# Patient Record
Sex: Female | Born: 1965 | Race: White | Hispanic: No | Marital: Single | Smoking: Never smoker
Health system: Southern US, Community
[De-identification: ages and names within clinical notes are randomized; demographics above are authoritative.]

## PROBLEM LIST (undated history)

## (undated) DIAGNOSIS — G4730 Sleep apnea, unspecified: Secondary | ICD-10-CM

## (undated) DIAGNOSIS — Z9889 Other specified postprocedural states: Secondary | ICD-10-CM

## (undated) DIAGNOSIS — T8859XA Other complications of anesthesia, initial encounter: Secondary | ICD-10-CM

## (undated) DIAGNOSIS — I208 Other forms of angina pectoris: Secondary | ICD-10-CM

## (undated) DIAGNOSIS — E119 Type 2 diabetes mellitus without complications: Secondary | ICD-10-CM

## (undated) DIAGNOSIS — J189 Pneumonia, unspecified organism: Secondary | ICD-10-CM

## (undated) DIAGNOSIS — R112 Nausea with vomiting, unspecified: Secondary | ICD-10-CM

## (undated) DIAGNOSIS — T7840XA Allergy, unspecified, initial encounter: Secondary | ICD-10-CM

## (undated) HISTORY — PX: OOPHORECTOMY: SHX86

## (undated) HISTORY — DX: Type 2 diabetes mellitus without complications: E11.9

## (undated) HISTORY — PX: ABDOMINAL HYSTERECTOMY: SHX81

## (undated) HISTORY — DX: Allergy, unspecified, initial encounter: T78.40XA

## (undated) HISTORY — PX: TUBAL LIGATION: SHX77

## (undated) HISTORY — DX: Other forms of angina pectoris: I20.8

## (undated) HISTORY — PX: TOTAL VAGINAL HYSTERECTOMY: SHX2548

## (undated) HISTORY — PX: CHOLECYSTECTOMY: SHX55

## (undated) HISTORY — PX: OTHER SURGICAL HISTORY: SHX169

## (undated) HISTORY — PX: NASAL SINUS SURGERY: SHX719

---

## 2003-11-14 ENCOUNTER — Ambulatory Visit (HOSPITAL_BASED_OUTPATIENT_CLINIC_OR_DEPARTMENT_OTHER): Admission: RE | Admit: 2003-11-14 | Discharge: 2003-11-14 | Payer: Self-pay | Admitting: Neurology

## 2003-12-26 ENCOUNTER — Ambulatory Visit (HOSPITAL_BASED_OUTPATIENT_CLINIC_OR_DEPARTMENT_OTHER): Admission: RE | Admit: 2003-12-26 | Discharge: 2003-12-26 | Payer: Self-pay | Admitting: Ophthalmology

## 2005-04-23 ENCOUNTER — Other Ambulatory Visit: Payer: Self-pay

## 2005-04-23 ENCOUNTER — Emergency Department: Payer: Self-pay | Admitting: Emergency Medicine

## 2006-05-28 ENCOUNTER — Ambulatory Visit: Payer: Self-pay | Admitting: Gastroenterology

## 2008-07-29 ENCOUNTER — Emergency Department: Payer: Self-pay | Admitting: Emergency Medicine

## 2009-03-04 ENCOUNTER — Ambulatory Visit: Payer: Self-pay | Admitting: Family Medicine

## 2010-04-29 ENCOUNTER — Ambulatory Visit: Payer: Self-pay | Admitting: Family Medicine

## 2011-05-24 ENCOUNTER — Ambulatory Visit: Payer: Self-pay | Admitting: Family Medicine

## 2011-10-24 ENCOUNTER — Ambulatory Visit: Payer: Self-pay | Admitting: Gastroenterology

## 2011-10-30 LAB — PATHOLOGY REPORT

## 2013-03-12 ENCOUNTER — Ambulatory Visit: Payer: Self-pay | Admitting: Family Medicine

## 2013-03-19 ENCOUNTER — Ambulatory Visit: Payer: Self-pay | Admitting: Family Medicine

## 2014-04-17 ENCOUNTER — Ambulatory Visit: Payer: Self-pay | Admitting: Family Medicine

## 2014-04-21 ENCOUNTER — Ambulatory Visit: Payer: Self-pay | Admitting: Family Medicine

## 2014-10-28 ENCOUNTER — Ambulatory Visit: Payer: Self-pay | Admitting: Family Medicine

## 2015-02-11 DIAGNOSIS — I493 Ventricular premature depolarization: Secondary | ICD-10-CM | POA: Insufficient documentation

## 2015-05-28 ENCOUNTER — Other Ambulatory Visit: Payer: Self-pay | Admitting: Internal Medicine

## 2015-05-28 DIAGNOSIS — Z1231 Encounter for screening mammogram for malignant neoplasm of breast: Secondary | ICD-10-CM

## 2015-06-04 ENCOUNTER — Ambulatory Visit: Payer: Self-pay

## 2015-06-11 ENCOUNTER — Other Ambulatory Visit: Payer: Self-pay | Admitting: Internal Medicine

## 2015-06-11 ENCOUNTER — Ambulatory Visit
Admission: RE | Admit: 2015-06-11 | Discharge: 2015-06-11 | Disposition: A | Discharge status: Home / Self Care | Payer: 59 | Source: Ambulatory Visit | Attending: Internal Medicine | Admitting: Internal Medicine

## 2015-06-11 DIAGNOSIS — Z1231 Encounter for screening mammogram for malignant neoplasm of breast: Secondary | ICD-10-CM | POA: Diagnosis present

## 2015-07-08 ENCOUNTER — Encounter: Payer: Self-pay | Admitting: Internal Medicine

## 2015-07-08 ENCOUNTER — Other Ambulatory Visit: Payer: Self-pay | Admitting: Internal Medicine

## 2015-07-08 DIAGNOSIS — E119 Type 2 diabetes mellitus without complications: Secondary | ICD-10-CM | POA: Insufficient documentation

## 2015-07-08 DIAGNOSIS — E1169 Type 2 diabetes mellitus with other specified complication: Secondary | ICD-10-CM | POA: Insufficient documentation

## 2015-07-08 DIAGNOSIS — E559 Vitamin D deficiency, unspecified: Secondary | ICD-10-CM | POA: Insufficient documentation

## 2015-07-08 DIAGNOSIS — J3089 Other allergic rhinitis: Secondary | ICD-10-CM | POA: Insufficient documentation

## 2015-07-08 DIAGNOSIS — E785 Hyperlipidemia, unspecified: Secondary | ICD-10-CM

## 2015-07-09 ENCOUNTER — Ambulatory Visit (INDEPENDENT_AMBULATORY_CARE_PROVIDER_SITE_OTHER): Payer: 59 | Admitting: Internal Medicine

## 2015-07-09 ENCOUNTER — Encounter: Payer: Self-pay | Admitting: Internal Medicine

## 2015-07-09 VITALS — BP 120/80 | HR 64 | Temp 97.8°F | Ht 65.0 in | Wt 301.4 lb

## 2015-07-09 DIAGNOSIS — J0100 Acute maxillary sinusitis, unspecified: Secondary | ICD-10-CM | POA: Diagnosis not present

## 2015-07-09 DIAGNOSIS — E119 Type 2 diabetes mellitus without complications: Secondary | ICD-10-CM

## 2015-07-09 MED ORDER — AMOXICILLIN-POT CLAVULANATE 875-125 MG PO TABS: 1.0000 | ORAL_TABLET | Freq: Two times a day (BID) | ORAL | Status: DC

## 2015-07-09 NOTE — Progress Notes (Signed)
Date:  07/09/2015   Name:  Mary Macias   DOB:  16-Nov-1965   MRN:  [id number]   Chief Complaint: Sinusitis Sinusitis This is a new problem. The current episode started in the past 7 days. The problem has been gradually worsening since onset. The maximum temperature recorded prior to her arrival was 100.4 - 100.9 F. The pain is mild. Associated symptoms include congestion, coughing, a hoarse voice, sinus pressure and a sore throat. Pertinent negatives include no chills, ear pain, headaches or shortness of breath. Past treatments include oral decongestants and spray decongestants. The treatment provided moderate relief.  Diabetes She presents for her follow-up diabetic visit. She has type 2 diabetes mellitus. Her disease course has been stable. Pertinent negatives for hypoglycemia include no headaches. Pertinent negatives for diabetes include no chest pain and no fatigue. Symptoms are stable.   Her last A1c was 6.7. At that time I suggested that she begin metformin but she wanted to work on weight loss. She reports no success with losing any weight. She would consider medication if her A1c were worsening.   Review of Systems:  Review of Systems  Constitutional: Positive for fever. Negative for chills and fatigue.  HENT: Positive for congestion, hoarse voice, sinus pressure and sore throat. Negative for ear pain.   Respiratory: Positive for cough and chest tightness. Negative for shortness of breath and wheezing.   Cardiovascular: Negative for chest pain.  Gastrointestinal: Negative for vomiting and diarrhea.  Neurological: Negative for light-headedness and headaches.    Patient Active Problem List   Diagnosis Date Noted  . Diabetes 07/08/2015  . 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 Allergic rhinitis, seasonal 07/08/2015  . Avitaminosis D 07/08/2015  . Beat, premature ventricular 02/11/2015  . Awareness of heartbeats 02/11/2015    Prior to Admission  medications   Medication Sig Start Date End Date Taking? Authorizing Provider  fluticasone (FLONASE) 50 MCG/ACT nasal spray Place 2 sprays into the nose daily.    Historical Provider, MD    Allergies  Allergen Reactions  . Povidone Iodine Rash    Other Reaction: Other reaction  . Cephalexin Rash  . Peanut-Containing Drug Products Rash    Past Surgical History  Procedure Laterality Date  . Total vaginal hysterectomy    . Cholecystectomy    . Tubal ligation    . Nasal sinus surgery      Social History  Substance Use Topics  . Smoking status: Never Smoker   . Smokeless tobacco: None  . Alcohol Use: No     Medication list has been reviewed and updated.  Physical Examination:  Physical Exam  Constitutional: She is oriented to person, place, and time. She appears well-developed and well-nourished. No distress.  HENT:  Head: Normocephalic and atraumatic.  Right Ear: Tympanic membrane and ear canal normal.  Left Ear: Tympanic membrane and ear canal normal.  Nose: Right sinus exhibits maxillary sinus tenderness and frontal sinus tenderness. Left sinus exhibits maxillary sinus tenderness and frontal sinus tenderness.  Mouth/Throat: Posterior oropharyngeal erythema present.  Eyes: Conjunctivae are normal. Right eye exhibits no discharge. Left eye exhibits no discharge. No scleral icterus.  Cardiovascular: Normal rate, regular rhythm and normal heart sounds.   Pulmonary/Chest: Effort normal and breath sounds normal. No respiratory distress. She has no wheezes. She has no rhonchi.  Musculoskeletal: Normal range of motion.  Neurological: She is alert and oriented to person, place, and time.  Skin: Skin is warm and dry. No rash noted.  Psychiatric: She has a normal mood and affect. Her behavior is normal. Thought content normal.    BP 120/80 mmHg  Pulse 64  Temp(Src) 97.8 F (36.6 C)  Ht 5\' 5"  (1.651 m)  Wt 301 lb 6.4 oz (136.714 kg)  BMI 50.16 kg/m2  SpO2 99%  Assessment  and Plan: 1. Acute maxillary sinusitis, recurrence not specified Augmentin 875 twice a day 10 days continue Flonase and over-the-counter allergy medications   2. Type 2 diabetes mellitus without complication Continue efforts at diet and weight loss Will advise her if medication should be started - Hemoglobin A1c   Halina Maidens, MD [facility]  07/09/2015

## 2015-07-10 LAB — HEMOGLOBIN A1C
Est. average glucose Bld gHb Est-mCnc: 151 mg/dL
HEMOGLOBIN A1C: 6.9 % — AB (ref 4.8–5.6)

## 2015-07-11 ENCOUNTER — Other Ambulatory Visit: Payer: Self-pay | Admitting: Internal Medicine

## 2015-07-11 MED ORDER — METFORMIN HCL 500 MG PO TABS: 500.0000 mg | ORAL_TABLET | Freq: Two times a day (BID) | ORAL | Status: DC

## 2015-08-18 ENCOUNTER — Ambulatory Visit (INDEPENDENT_AMBULATORY_CARE_PROVIDER_SITE_OTHER): Payer: 59 | Admitting: Internal Medicine

## 2015-08-18 ENCOUNTER — Encounter: Payer: Self-pay | Admitting: Internal Medicine

## 2015-08-18 VITALS — BP 120/70 | HR 68 | Temp 97.8°F | Ht 65.0 in | Wt 302.4 lb

## 2015-08-18 DIAGNOSIS — Z79899 Other long term (current) drug therapy: Secondary | ICD-10-CM | POA: Insufficient documentation

## 2015-08-18 DIAGNOSIS — J40 Bronchitis, not specified as acute or chronic: Secondary | ICD-10-CM | POA: Diagnosis not present

## 2015-08-18 DIAGNOSIS — E119 Type 2 diabetes mellitus without complications: Secondary | ICD-10-CM

## 2015-08-18 DIAGNOSIS — E118 Type 2 diabetes mellitus with unspecified complications: Secondary | ICD-10-CM | POA: Insufficient documentation

## 2015-08-18 MED ORDER — SITAGLIPTIN PHOSPHATE 100 MG PO TABS: 100.0000 mg | ORAL_TABLET | Freq: Every day | ORAL | Status: DC

## 2015-08-18 MED ORDER — DOXYCYCLINE HYCLATE 100 MG PO TABS: 100.0000 mg | ORAL_TABLET | Freq: Two times a day (BID) | ORAL | Status: DC

## 2015-08-18 NOTE — Progress Notes (Signed)
Date:  08/18/2015   Name:  Mary Macias   DOB:  1965-12-02   MRN:  [id number]   Chief Complaint: Cough Cough This is a new problem. The current episode started in the past 7 days. The problem has been gradually worsening. The problem occurs hourly. The cough is non-productive. Associated symptoms include chest pain (burning chest discomfort with cough), a fever, nasal congestion, postnasal drip and shortness of breath. Pertinent negatives include no chills, ear pain, headaches, sore throat, sweats or wheezing. The symptoms are aggravated by exercise. She has tried OTC cough suppressant for the symptoms. The treatment provided no relief.  Diabetes She presents for her follow-up diabetic visit. She has type 2 diabetes mellitus. Pertinent negatives for hypoglycemia include no headaches or sweats. Associated symptoms include chest pain (burning chest discomfort with cough) and fatigue. Pertinent negatives for diabetes include no polydipsia and no polyuria. Current diabetic treatment includes oral agent (monotherapy). She is compliant with treatment some of the time (could not tolerate metformin due to diarrhea). Her weight is stable. She is following a generally healthy diet. Her breakfast blood glucose is taken between 7-8 am. Her breakfast blood glucose range is generally 110-130 mg/dl. An ACE inhibitor/angiotensin II receptor blocker is not being taken.     Review of Systems  Constitutional: Positive for fever and fatigue. Negative for chills.  HENT: Positive for postnasal drip. Negative for ear discharge, ear pain, sinus pressure and sore throat.   Respiratory: Positive for cough and shortness of breath. Negative for choking and wheezing.   Cardiovascular: Positive for chest pain (burning chest discomfort with cough).  Gastrointestinal: Negative for vomiting and diarrhea.  Endocrine: Negative for polydipsia and polyuria.  Neurological: Negative for headaches.    Patient Active Problem List    Diagnosis Date Noted  . Type 2 diabetes mellitus without complication, without long-term current use of insulin ([place]) 08/18/2015  . 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 Allergic rhinitis, seasonal 07/08/2015  . Avitaminosis D 07/08/2015  . Beat, premature ventricular 02/11/2015  . Awareness of heartbeats 02/11/2015    Prior to Admission medications   Medication Sig Start Date End Date Taking? Authorizing Provider  fluticasone (FLONASE) 50 MCG/ACT nasal spray Place 2 sprays into the nose daily.   Yes Historical Provider, MD  metFORMIN (GLUCOPHAGE) 500 MG tablet Take 1 tablet (500 mg total) by mouth 2 (two) times daily with a meal. Patient not taking: Reported on 08/18/2015 07/11/15   Glean Hess, MD    Allergies  Allergen Reactions  . Povidone Iodine Rash    Other Reaction: Other reaction  . Cephalexin Rash  . Peanut-Containing Drug Products Rash    Past Surgical History  Procedure Laterality Date  . Total vaginal hysterectomy    . Cholecystectomy    . Tubal ligation    . Nasal sinus surgery      Social History  Substance Use Topics  . Smoking status: Never Smoker   . Smokeless tobacco: None  . Alcohol Use: No    Medication list has been reviewed and updated.   Physical Exam  Constitutional: She is oriented to person, place, and time. She appears well-developed and well-nourished. No distress.  HENT:  Head: Normocephalic and atraumatic.  Eyes: Conjunctivae are normal. Right eye exhibits no discharge. Left eye exhibits no discharge. No scleral icterus.  Cardiovascular: Normal rate, regular rhythm and normal heart sounds.   No murmur heard. Pulmonary/Chest: Effort normal. No accessory muscle usage. No respiratory distress. She  has decreased breath sounds (bronchial breath sounds both upper lobes). She has no wheezes.  Musculoskeletal: Normal range of motion.  Neurological: She is alert and oriented to person, place, and time.  Skin:  Skin is warm and dry. No rash noted.  Psychiatric: She has a normal mood and affect. Her behavior is normal. Thought content normal.  Nursing note and vitals reviewed.   BP 120/70 mmHg  Pulse 68  Temp(Src) 97.8 F (36.6 C)  Ht 5\' 5"  (1.651 m)  Wt 302 lb 6.4 oz (137.168 kg)  BMI 50.32 kg/m2  SpO2 99%  Assessment and Plan: 1. Bronchitis Suspect atypical organism Over-the-counter cough syrup and Allegra - doxycycline (VIBRA-TABS) 100 MG tablet; Take 1 tablet (100 mg total) by mouth 2 (two) times daily.  Dispense: 20 tablet; Refill: 0  2. Type 2 diabetes mellitus without complication, without long-term current use of insulin (HCC) Intolerant of metformin so will prescribe Januvia - sitaGLIPtin (JANUVIA) 100 MG tablet; Take 1 tablet (100 mg total) by mouth daily.  Dispense: 30 tablet; Refill: 0   Halina Maidens, MD [facility]  08/18/2015

## 2015-09-01 ENCOUNTER — Encounter: Payer: Self-pay | Admitting: Internal Medicine

## 2015-09-01 ENCOUNTER — Ambulatory Visit (INDEPENDENT_AMBULATORY_CARE_PROVIDER_SITE_OTHER): Payer: 59 | Admitting: Internal Medicine

## 2015-09-01 VITALS — BP 130/72 | HR 88 | Temp 97.8°F | Ht 65.0 in | Wt 304.2 lb

## 2015-09-01 DIAGNOSIS — J40 Bronchitis, not specified as acute or chronic: Secondary | ICD-10-CM | POA: Diagnosis not present

## 2015-09-01 MED ORDER — LEVOFLOXACIN 500 MG PO TABS
500.0000 mg | ORAL_TABLET | Freq: Every day | ORAL | Status: DC
Start: 2015-09-01 — End: 2015-12-03

## 2015-09-01 NOTE — Progress Notes (Signed)
Date:  09/01/2015   Name:  Mary Macias   DOB:  12/20/1965   MRN:  [id number]   Chief Complaint: Cough  Patient was seen 2 weeks ago for tracheobronchitis treated with doxycycline. She finished 10 days of treatment 4 days ago. Since then she's had progressive symptoms again with burning in her chest dry cough and fatigue. She continues to use Flonase and Allegra-D for her sinus symptoms. She denies any wheezing or discolored nasal discharge or sputum.  Review of Systems  Constitutional: Positive for fatigue. Negative for fever, chills and unexpected weight change.  HENT: Positive for postnasal drip. Negative for sinus pressure and sore throat.   Respiratory: Positive for cough and chest tightness. Negative for shortness of breath and wheezing.   Cardiovascular: Negative for chest pain and palpitations.  Gastrointestinal: Negative for nausea, abdominal pain and diarrhea.  Neurological: Negative for dizziness and headaches.    Patient Active Problem List   Diagnosis Date Noted  . Type 2 diabetes mellitus without complication, without long-term current use of insulin ([place]) 08/18/2015  . 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 Allergic rhinitis, seasonal 07/08/2015  . Avitaminosis D 07/08/2015  . Beat, premature ventricular 02/11/2015  . Awareness of heartbeats 02/11/2015    Prior to Admission medications   Medication Sig Start Date End Date Taking? Authorizing Provider  fluticasone (FLONASE) 50 MCG/ACT nasal spray Place 2 sprays into the nose daily.   Yes Historical Provider, MD  sitaGLIPtin (JANUVIA) 100 MG tablet Take 1 tablet (100 mg total) by mouth daily. 08/18/15  Yes Glean Hess, MD    Allergies  Allergen Reactions  . Povidone Iodine Rash    Other Reaction: Other reaction  . Cephalexin Rash  . Peanut-Containing Drug Products Rash    Past Surgical History  Procedure Laterality Date  . Total vaginal hysterectomy    . Cholecystectomy    .  Tubal ligation    . Nasal sinus surgery      Social History  Substance Use Topics  . Smoking status: Never Smoker   . Smokeless tobacco: None  . Alcohol Use: No    Medication list has been reviewed and updated.   Physical Exam  Constitutional: She is oriented to person, place, and time. She appears well-developed and well-nourished. No distress.  HENT:  Head: Normocephalic and atraumatic.  Neck: Normal range of motion. Neck supple.  Cardiovascular: Normal rate, regular rhythm and normal heart sounds.   Pulmonary/Chest: Effort normal and breath sounds normal. No respiratory distress. She has no wheezes.  Lymphadenopathy:    She has no cervical adenopathy.  Neurological: She is alert and oriented to person, place, and time.  Skin: No rash noted.  Psychiatric: She has a normal mood and affect.    BP 130/72 mmHg  Pulse 88  Temp(Src) 97.8 F (36.6 C)  Ht 5\' 5"  (1.651 m)  Wt 304 lb 3.2 oz (137.984 kg)  BMI 50.62 kg/m2  SpO2 98%  Assessment and Plan: 1. Bronchitis Continue Flonase and Allegra-D Rest and adequate fluids - levofloxacin (LEVAQUIN) 500 MG tablet; Take 1 tablet (500 mg total) by mouth daily.  Dispense: 10 tablet; Refill: 0   Halina Maidens, MD [facility]  09/01/2015

## 2015-09-27 ENCOUNTER — Telehealth: Payer: Self-pay

## 2015-09-27 ENCOUNTER — Other Ambulatory Visit: Payer: Self-pay | Admitting: Internal Medicine

## 2015-09-27 MED ORDER — NYSTATIN 100000 UNIT/GM EX CREA: 1.0000 "application " | TOPICAL_CREAM | Freq: Two times a day (BID) | CUTANEOUS | Status: DC

## 2015-09-27 NOTE — Telephone Encounter (Signed)
Patient needs Nystatin Cream called in to [facility] in [place] for skin infection.dr

## 2015-10-12 ENCOUNTER — Ambulatory Visit (INDEPENDENT_AMBULATORY_CARE_PROVIDER_SITE_OTHER): Payer: 59

## 2015-10-12 DIAGNOSIS — Z23 Encounter for immunization: Secondary | ICD-10-CM | POA: Diagnosis not present

## 2015-10-13 ENCOUNTER — Other Ambulatory Visit: Payer: Self-pay | Admitting: Internal Medicine

## 2015-11-16 ENCOUNTER — Ambulatory Visit: Payer: 59 | Admitting: Internal Medicine

## 2015-12-03 ENCOUNTER — Other Ambulatory Visit: Payer: Self-pay | Admitting: Internal Medicine

## 2015-12-03 ENCOUNTER — Ambulatory Visit (INDEPENDENT_AMBULATORY_CARE_PROVIDER_SITE_OTHER): Payer: 59 | Admitting: Internal Medicine

## 2015-12-03 ENCOUNTER — Encounter: Payer: Self-pay | Admitting: Internal Medicine

## 2015-12-03 VITALS — BP 122/78 | HR 68 | Resp 16 | Ht 65.0 in | Wt 307.0 lb

## 2015-12-03 DIAGNOSIS — E119 Type 2 diabetes mellitus without complications: Secondary | ICD-10-CM | POA: Diagnosis not present

## 2015-12-03 MED ORDER — METFORMIN HCL ER (OSM) 500 MG PO TB24: 500.0000 mg | ORAL_TABLET | Freq: Every day | ORAL | Status: DC

## 2015-12-03 MED ORDER — METFORMIN HCL ER (MOD) 500 MG PO TB24: 500.0000 mg | ORAL_TABLET | Freq: Every day | ORAL | Status: DC

## 2015-12-03 NOTE — Progress Notes (Signed)
Date:  12/03/2015   Name:  Mary Macias   DOB:  07/12/1966   MRN:  [id number]   Chief Complaint: Diabetes Diabetes She presents for her follow-up diabetic visit. She has type 2 diabetes mellitus. Her disease course has been worsening. Associated symptoms include foot paresthesias. Pertinent negatives for diabetes include no chest pain, no fatigue, no polyphagia and no polyuria. Symptoms are stable (unable to afford januvia; metformin caused diarrhea). Her breakfast blood glucose is taken between 6-7 am. Her breakfast blood glucose range is generally 110-130 mg/dl.  Could not afford januvia - $250 per month.  She would like to try metformin again - had taken it in the past.  The recent Rx was not for sustained release.  Lab Results  Component Value Date   HGBA1C 6.9* 07/09/2015     Review of Systems  Constitutional: Negative for chills, fatigue and unexpected weight change.  Eyes: Negative for visual disturbance.  Respiratory: Negative for cough, chest tightness and wheezing.   Cardiovascular: Negative for chest pain, palpitations and leg swelling.  Gastrointestinal: Negative for abdominal pain, diarrhea and constipation.  Endocrine: Negative for polyphagia and polyuria.  Genitourinary: Negative for dysuria.  Musculoskeletal: Negative for arthralgias.  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  Psychiatric/Behavioral: Negative for dysphoric mood.    Patient Active Problem List   Diagnosis Date Noted  . Diabetes mellitus ([place]) 12/03/2015  . Type 2 diabetes mellitus without complication, without long-term current use of insulin ([place]) 08/18/2015  . 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 Allergic rhinitis, seasonal 07/08/2015  . Avitaminosis D 07/08/2015  . Beat, premature ventricular 02/11/2015  . Awareness of heartbeats 02/11/2015    Prior to Admission medications   Medication Sig Start Date End Date Taking? Authorizing  Provider  fluticasone (FLONASE) 50 MCG/ACT nasal spray Place 2 sprays into the nose daily.   Yes Historical Provider, MD  JANUVIA 100 MG tablet TAKE 1 TABLET (100 MG TOTAL) BY MOUTH DAILY. 10/14/15  Yes Glean Hess, MD  nystatin cream (MYCOSTATIN) Apply 1 application topically 2 (two) times daily. 09/27/15  Yes Glean Hess, MD    Allergies  Allergen Reactions  . Povidone Iodine Rash    Other Reaction: Other reaction  . Cephalexin Rash  . Peanut-Containing Drug Products Rash    Past Surgical History  Procedure Laterality Date  . Total vaginal hysterectomy    . Cholecystectomy    . Tubal ligation    . Nasal sinus surgery      Social History  Substance Use Topics  . Smoking status: Never Smoker   . Smokeless tobacco: None  . Alcohol Use: No     Medication list has been reviewed and updated.   Physical Exam  Constitutional: She is oriented to person, place, and time. She appears well-developed. No distress.  HENT:  Head: Normocephalic and atraumatic.  Cardiovascular: Normal rate, regular rhythm and normal heart sounds.   Pulmonary/Chest: Effort normal and breath sounds normal. No respiratory distress.  Musculoskeletal: Normal range of motion. She exhibits no edema.  Lymphadenopathy:    She has no cervical adenopathy.  Neurological: She is alert and oriented to person, place, and time.  Skin: Skin is warm and dry. No rash noted.  Psychiatric: She has a normal mood and affect. Her behavior is normal. Thought content normal.  Nursing note and vitals reviewed.   BP 122/78 mmHg  Pulse 68  Resp 16  Ht 5\' 5"  (1.651  m)  Wt 307 lb (139.254 kg)  BMI 51.09 kg/m2  SpO2 97%  Assessment and Plan: 1. Type 2 diabetes mellitus without complication, without long-term current use of insulin ([place]) Stop januvia due to cost Try ER formulation metformin Follow up in 3 months - metformin (FORTAMET) 500 MG (OSM) 24 hr tablet; Take 1 tablet (500 mg total) by mouth daily with  breakfast.  Dispense: 30 tablet; Refill: [place], MD [facility]  12/03/2015

## 2016-03-03 ENCOUNTER — Ambulatory Visit: Payer: 59 | Admitting: Internal Medicine

## 2016-03-08 ENCOUNTER — Ambulatory Visit: Payer: 59 | Admitting: Internal Medicine

## 2016-03-10 ENCOUNTER — Encounter: Payer: Self-pay | Admitting: Internal Medicine

## 2016-03-10 ENCOUNTER — Ambulatory Visit (INDEPENDENT_AMBULATORY_CARE_PROVIDER_SITE_OTHER): Payer: 59 | Admitting: Internal Medicine

## 2016-03-10 VITALS — BP 120/80 | HR 84 | Temp 98.6°F | Resp 16 | Ht 65.0 in | Wt 306.0 lb

## 2016-03-10 DIAGNOSIS — J302 Other seasonal allergic rhinitis: Secondary | ICD-10-CM | POA: Diagnosis not present

## 2016-03-10 DIAGNOSIS — E119 Type 2 diabetes mellitus without complications: Secondary | ICD-10-CM

## 2016-03-10 MED ORDER — FEXOFENADINE-PSEUDOEPHED ER 180-240 MG PO TB24: 1.0000 | ORAL_TABLET | Freq: Every day | ORAL | Status: DC

## 2016-03-10 NOTE — Progress Notes (Signed)
Date:  03/10/2016   Name:  Mary Macias   DOB:  1966-05-27   MRN:  [id number]   Chief Complaint: Diabetes and Mouth Lesions Diabetes She presents for her follow-up diabetic visit. She has type 2 diabetes mellitus. Pertinent negatives for hypoglycemia include no dizziness or headaches. Pertinent negatives for diabetes include no fatigue. Symptoms are stable. Current diabetic treatment includes oral agent (monotherapy). She is compliant with treatment all of the time. There is no change in her home blood glucose trend. An ACE inhibitor/angiotensin II receptor blocker is not being taken.  Mouth Lesions  Associated symptoms include mouth sores and rhinorrhea. Pertinent negatives include no constipation, no ear pain and no headaches.  She has noticed her tongue turned dark after using hydrogen peroxide.  She denies pain or altered taste.  She still has tonsil stones that she has been managing.  Allergic rhinitis - taking allegra-D with good control of symtpoms.  Tonsil stones seen to be less frequent.  Wt Readings from Last 3 Encounters:  03/10/16 306 lb (138.801 kg)  12/03/15 307 lb (139.254 kg)  09/01/15 304 lb 3.2 oz (137.984 kg)     Review of Systems  Constitutional: Negative for chills, fatigue and unexpected weight change.  HENT: Positive for mouth sores, postnasal drip and rhinorrhea. Negative for ear pain, tinnitus and trouble swallowing.   Eyes: Negative for visual disturbance.  Respiratory: Negative for chest tightness and shortness of breath.   Cardiovascular: Negative for palpitations and leg swelling.  Gastrointestinal: Negative for constipation.  Neurological: Negative for dizziness and headaches.  Psychiatric/Behavioral: Negative for dysphoric mood.    Patient Active Problem List   Diagnosis Date Noted  . Type 2 diabetes mellitus without complication, without long-term current use of insulin ([place]) 08/18/2015  . Combined fat and carbohydrate induced hyperlipemia  07/08/2015  . Allergic rhinitis, seasonal 07/08/2015  . Avitaminosis D 07/08/2015  . Beat, premature ventricular 02/11/2015  . Awareness of heartbeats 02/11/2015    Prior to Admission medications   Medication Sig Start Date End Date Taking? Authorizing Provider  fexofenadine-pseudoephedrine (ALLEGRA-D 24) 180-240 MG 24 hr tablet Take 1 tablet by mouth daily.   Yes Historical Provider, MD  fluticasone (FLONASE) 50 MCG/ACT nasal spray Place 2 sprays into the nose daily.   Yes Historical Provider, MD  metFORMIN (GLUCOPHAGE-XR) 500 MG 24 hr tablet TAKE 1 TABLET (500 MG TOTAL) BY MOUTH DAILY WITH BREAKFAST. 02/16/16  Yes Historical Provider, MD  nystatin cream (MYCOSTATIN) Apply 1 application topically 2 (two) times daily. 09/27/15  Yes Glean Hess, MD    Allergies  Allergen Reactions  . Povidone Iodine Rash    Other Reaction: Other reaction  . Cephalexin Rash  . Peanut-Containing Drug Products Rash    Past Surgical History  Procedure Laterality Date  . Total vaginal hysterectomy    . Cholecystectomy    . Tubal ligation    . Nasal sinus surgery      Social History  Substance Use Topics  . Smoking status: Never Smoker   . Smokeless tobacco: None  . Alcohol Use: No     Medication list has been reviewed and updated.   Physical Exam  Constitutional: She is oriented to person, place, and time. She appears well-developed. No distress.  HENT:  Head: Normocephalic and atraumatic.  Mouth/Throat: Uvula is midline and oropharynx is clear and moist.  Tongue with brown-green discoloration - no plaque or tenderness Buccal mucosa normal Tonsils normal - no stones seen  Neck: Normal  range of motion. Neck supple. Carotid bruit is not present.  Cardiovascular: Normal rate, regular rhythm and normal heart sounds.   Pulmonary/Chest: Effort normal and breath sounds normal. No respiratory distress. She has no wheezes.  Musculoskeletal: She exhibits no edema or tenderness.    Neurological: She is alert and oriented to person, place, and time.  Skin: Skin is warm and dry. No rash noted.  Psychiatric: She has a normal mood and affect. Her speech is normal and behavior is normal. Thought content normal.  Nursing note and vitals reviewed.   BP 120/80 mmHg  Pulse 84  Temp(Src) 98.6 F (37 C) (Oral)  Resp 16  Ht 5\' 5"  (1.651 m)  Wt 306 lb (138.801 kg)  BMI 50.92 kg/m2  SpO2 100%  Assessment and Plan: 1. Type 2 diabetes mellitus without complication, without long-term current use of insulin (HCC) Continue metformin; check labs - Hemoglobin A1c - Comprehensive metabolic panel  2. Allergic rhinitis, seasonal - fexofenadine-pseudoephedrine (ALLEGRA-D 24) 180-240 MG 24 hr tablet; Take 1 tablet by mouth daily.  Dispense: 30 tablet; Refill: 5  3. Discolored tongue  Continue saline gargles  Halina Maidens, MD [facility]  03/10/2016

## 2016-03-11 LAB — COMPREHENSIVE METABOLIC PANEL
A/G RATIO: 1.5 (ref 1.2–2.2)
ALBUMIN: 4.3 g/dL (ref 3.5–5.5)
ALT: 31 IU/L (ref 0–32)
AST: 24 IU/L (ref 0–40)
Alkaline Phosphatase: 68 IU/L (ref 39–117)
BUN / CREAT RATIO: 15 (ref 9–23)
BUN: 15 mg/dL (ref 6–24)
Bilirubin Total: 0.3 mg/dL (ref 0.0–1.2)
CALCIUM: 9.4 mg/dL (ref 8.7–10.2)
CO2: 22 mmol/L (ref 18–29)
Chloride: 98 mmol/L (ref 96–106)
Creatinine, Ser: 1.01 mg/dL — ABNORMAL HIGH (ref 0.57–1.00)
GFR calc Af Amer: 74 mL/min/{1.73_m2} (ref >59)
GFR, EST NON AFRICAN AMERICAN: 65 mL/min/{1.73_m2} (ref >59)
GLOBULIN, TOTAL: 2.8 g/dL (ref 1.5–4.5)
Glucose: 134 mg/dL — ABNORMAL HIGH (ref 65–99)
POTASSIUM: 4.2 mmol/L (ref 3.5–5.2)
SODIUM: 138 mmol/L (ref 134–144)
Total Protein: 7.1 g/dL (ref 6.0–8.5)

## 2016-03-11 LAB — HEMOGLOBIN A1C
ESTIMATED AVERAGE GLUCOSE: 151 mg/dL
HEMOGLOBIN A1C: 6.9 % — AB (ref 4.8–5.6)

## 2016-03-23 ENCOUNTER — Other Ambulatory Visit: Payer: Self-pay | Admitting: Internal Medicine

## 2016-03-23 MED ORDER — METFORMIN HCL ER 500 MG PO TB24: 500.0000 mg | ORAL_TABLET | Freq: Every day | ORAL | Status: DC

## 2016-04-24 ENCOUNTER — Encounter: Payer: Self-pay | Admitting: Internal Medicine

## 2016-04-24 ENCOUNTER — Ambulatory Visit (INDEPENDENT_AMBULATORY_CARE_PROVIDER_SITE_OTHER): Payer: 59 | Admitting: Internal Medicine

## 2016-04-24 VITALS — BP 122/80 | HR 74 | Temp 98.1°F | Resp 16 | Ht 65.0 in | Wt 305.0 lb

## 2016-04-24 DIAGNOSIS — J0100 Acute maxillary sinusitis, unspecified: Secondary | ICD-10-CM | POA: Diagnosis not present

## 2016-04-24 DIAGNOSIS — B009 Herpesviral infection, unspecified: Secondary | ICD-10-CM

## 2016-04-24 MED ORDER — AMOXICILLIN-POT CLAVULANATE 875-125 MG PO TABS: 1.0000 | ORAL_TABLET | Freq: Two times a day (BID) | ORAL | Status: DC

## 2016-04-24 NOTE — Progress Notes (Signed)
Date:  04/24/2016   Name:  Mary Macias   DOB:  10-01-1966   MRN:  [id number]   Chief Complaint: Sinus Problem Sinus Problem This is a new problem. The current episode started in the past 7 days. The problem has been waxing and waning since onset. There has been no fever. Associated symptoms include congestion, ear pain, neck pain, sinus pressure, a sore throat and swollen glands. Pertinent negatives include no shortness of breath. Past treatments include oral decongestants and spray decongestants. The treatment provided mild relief.    Review of Systems  HENT: Positive for congestion, ear pain, sinus pressure and sore throat.   Respiratory: Negative for chest tightness and shortness of breath.   Cardiovascular: Negative for chest pain.  Musculoskeletal: Positive for neck pain.  Skin: Positive for rash.  Hematological: Positive for adenopathy.    Patient Active Problem List   Diagnosis Date Noted  . Type 2 diabetes mellitus without complication, without long-term current use of insulin ([place]) 08/18/2015  . 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 Allergic rhinitis, seasonal 07/08/2015  . Avitaminosis D 07/08/2015  . Beat, premature ventricular 02/11/2015  . Awareness of heartbeats 02/11/2015    Prior to Admission medications   Medication Sig Start Date End Date Taking? Authorizing Provider  fexofenadine-pseudoephedrine (ALLEGRA-D 24) 180-240 MG 24 hr tablet Take 1 tablet by mouth daily. 03/10/16  Yes Glean Hess, MD  fluticasone (FLONASE) 50 MCG/ACT nasal spray Place 2 sprays into the nose daily.   Yes Historical Provider, MD  metFORMIN (GLUCOPHAGE-XR) 500 MG 24 hr tablet Take 1 tablet (500 mg total) by mouth daily with breakfast. 03/23/16  Yes Glean Hess, MD  nystatin cream (MYCOSTATIN) Apply 1 application topically 2 (two) times daily. 09/27/15  Yes Glean Hess, MD    Allergies  Allergen Reactions  . Povidone Iodine Rash    Other  Reaction: Other reaction  . Cephalexin Rash  . Peanut-Containing Drug Products Rash    Past Surgical History  Procedure Laterality Date  . Total vaginal hysterectomy    . Cholecystectomy    . Tubal ligation    . Nasal sinus surgery      Social History  Substance Use Topics  . Smoking status: Never Smoker   . Smokeless tobacco: None  . Alcohol Use: No     Medication list has been reviewed and updated.   Physical Exam  Constitutional: She is oriented to person, place, and time. She appears well-developed and well-nourished.  HENT:  Right Ear: External ear and ear canal normal. Tympanic membrane is not erythematous and not retracted.  Left Ear: External ear and ear canal normal. Tympanic membrane is not erythematous and not retracted.  Nose: Right sinus exhibits maxillary sinus tenderness and frontal sinus tenderness. Left sinus exhibits maxillary sinus tenderness and frontal sinus tenderness.  Mouth/Throat: Uvula is midline and mucous membranes are normal. No oral lesions. Posterior oropharyngeal erythema present. No oropharyngeal exudate.  Neck: Normal range of motion.  Cardiovascular: Normal rate, regular rhythm and normal heart sounds.   Pulmonary/Chest: Breath sounds normal. She has no wheezes. She has no rales.  Lymphadenopathy:    She has cervical adenopathy.  Neurological: She is alert and oriented to person, place, and time.  Skin: Rash (healing herpes lesions on left upper lip) noted.    BP 122/80 mmHg  Pulse 74  Temp(Src) 98.1 F (36.7 C)  Resp 16  Ht 5\' 5"  (1.651 m)  Wt 305 lb (  138.347 kg)  BMI 50.75 kg/m2  SpO2 100%  Assessment and Plan: 1. Acute maxillary sinusitis, recurrence not specified Continue allegra-D and flonase - amoxicillin-clavulanate (AUGMENTIN) 875-125 MG tablet; Take 1 tablet by mouth 2 (two) times daily.  Dispense: 20 tablet; Refill: 0  2. Herpes simplex Continue otc topicals as needed   Halina Maidens, MD [facility]  04/24/2016

## 2016-06-15 ENCOUNTER — Other Ambulatory Visit: Payer: Self-pay | Admitting: Internal Medicine

## 2016-07-14 ENCOUNTER — Other Ambulatory Visit: Payer: Self-pay | Admitting: Internal Medicine

## 2016-07-14 ENCOUNTER — Ambulatory Visit (INDEPENDENT_AMBULATORY_CARE_PROVIDER_SITE_OTHER): Payer: 59 | Admitting: Internal Medicine

## 2016-07-14 ENCOUNTER — Encounter: Payer: Self-pay | Admitting: Internal Medicine

## 2016-07-14 VITALS — BP 124/86 | HR 70 | Ht 65.0 in | Wt 303.0 lb

## 2016-07-14 DIAGNOSIS — E2839 Other primary ovarian failure: Secondary | ICD-10-CM

## 2016-07-14 DIAGNOSIS — Z1231 Encounter for screening mammogram for malignant neoplasm of breast: Secondary | ICD-10-CM

## 2016-07-14 DIAGNOSIS — J3089 Other allergic rhinitis: Secondary | ICD-10-CM

## 2016-07-14 DIAGNOSIS — Z23 Encounter for immunization: Secondary | ICD-10-CM

## 2016-07-14 DIAGNOSIS — E119 Type 2 diabetes mellitus without complications: Secondary | ICD-10-CM

## 2016-07-14 MED ORDER — EPINEPHRINE 0.3 MG/0.3ML IJ SOAJ: 0.3000 mg | Freq: Once | INTRAMUSCULAR | 2 refills | Status: AC

## 2016-07-14 MED ORDER — METFORMIN HCL ER 500 MG PO TB24: 500.0000 mg | ORAL_TABLET | Freq: Every day | ORAL | 1 refills | Status: DC

## 2016-07-14 NOTE — Progress Notes (Signed)
Date:  07/14/2016   Name:  Mary Macias   DOB:  1966-04-05   MRN:  [id number]   Chief Complaint: Follow-up (A1C) Diabetes  She presents for her follow-up diabetic visit. She has type 2 diabetes mellitus. Her disease course has been stable. Pertinent negatives for hypoglycemia include no dizziness or headaches. Pertinent negatives for diabetes include no chest pain, no fatigue and no polyuria. Symptoms are stable. She is compliant with treatment most of the time. There is no change in her home blood glucose trend. Her breakfast blood glucose is taken between 7-8 am. Her breakfast blood glucose range is generally 130-140 mg/dl.   Peanut allergy - needs renewal on EpiPen.  Also has chronic seasonal allergies for which she takes Allegra-D daily.  No recent sinus or ear infections.  HM - due for mammogram and thinks it is time for another DEXA.  Last D[facility] done about 2015 at [facility] but can't find a report.  Lab Results  Component Value Date   HGBA1C 6.9 (H) 03/10/2016      Review of Systems  Constitutional: Negative for chills, fatigue, fever and unexpected weight change.  Eyes: Negative for visual disturbance.  Respiratory: Negative for cough, chest tightness and shortness of breath.   Cardiovascular: Negative for chest pain and palpitations.  Gastrointestinal: Negative for abdominal pain and diarrhea.  Endocrine: Negative for polyuria.  Genitourinary: Negative for dysuria.  Skin: Negative for color change and rash.  Neurological: Negative for dizziness and headaches.  Hematological: Negative for adenopathy.  Psychiatric/Behavioral: Negative for sleep disturbance.    Patient Active Problem List   Diagnosis Date Noted  . Type 2 diabetes mellitus without complication, without long-term current use of insulin ([place]) 08/18/2015  . 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 Allergic rhinitis, seasonal 07/08/2015  . Avitaminosis D 07/08/2015  . Beat,  premature ventricular 02/11/2015  . Awareness of heartbeats 02/11/2015    Prior to Admission medications   Medication Sig Start Date End Date Taking? Authorizing Provider  fexofenadine-pseudoephedrine (ALLEGRA-D 24) 180-240 MG 24 hr tablet Take 1 tablet by mouth daily. 03/10/16  Yes Glean Hess, MD  fluticasone (FLONASE) 50 MCG/ACT nasal spray Place 2 sprays into the nose daily.   Yes Historical Provider, MD  metFORMIN (GLUCOPHAGE-XR) 500 MG 24 hr tablet Take 1 tablet (500 mg total) by mouth daily with breakfast. 03/23/16  Yes Glean Hess, MD  nystatin cream (MYCOSTATIN) APPLY 1 APPLICATION TOPICALLY 2 (TWO) TIMES DAILY. 06/15/16  Yes Glean Hess, MD    Allergies  Allergen Reactions  . Povidone Iodine Rash    Other Reaction: Other reaction  . Cephalexin Rash  . Peanut-Containing Drug Products Rash    Past Surgical History:  Procedure Laterality Date  . CHOLECYSTECTOMY    . NASAL SINUS SURGERY    . TOTAL VAGINAL HYSTERECTOMY    . TUBAL LIGATION      Social History  Substance Use Topics  . Smoking status: Never Smoker  . Smokeless tobacco: Never Used  . Alcohol use No     Medication list has been reviewed and updated.   Physical Exam  Constitutional: She is oriented to person, place, and time. She appears well-developed. No distress.  HENT:  Head: Normocephalic and atraumatic.  Neck: Carotid bruit is not present.  Cardiovascular: Normal rate, regular rhythm and normal heart sounds.   Pulmonary/Chest: Effort normal and breath sounds normal. No respiratory distress. She has no wheezes.  Musculoskeletal: She exhibits no edema  or tenderness.  Neurological: She is alert and oriented to person, place, and time.  Foot exam - normal skin, nails, pulses and sensation bilaterally   Skin: Skin is warm and dry. No rash noted.  Psychiatric: She has a normal mood and affect. Her speech is normal and behavior is normal. Thought content normal.  Nursing note and vitals  reviewed.   BP 124/86   Pulse 70   Ht 5\' 5"  (1.651 m)   Wt (!) 303 lb (137.4 kg)   BMI 50.42 kg/m   Assessment and Plan: 1. Type 2 diabetes mellitus without complication, without long-term current use of insulin ([facility]) Continue current therapy Resume regular exercise - metFORMIN (GLUCOPHAGE-XR) 500 MG 24 hr tablet; Take 1 tablet (500 mg total) by mouth daily with breakfast.  Dispense: 90 tablet; Refill: 1 - Microalbumin / creatinine urine ratio - Hemoglobin A1c - Lipid panel - TSH  2. Environmental and seasonal allergies - EPINEPHrine 0.3 mg/0.3 mL IJ SOAJ injection; Inject 0.3 mLs (0.3 mg total) into the muscle once. (Patient not taking: Reported on 07/14/2016)  Dispense: 1 Device; Refill: 2  3. Need for influenza vaccination - Flu Vaccine QUAD 36+ mos IM  4. Encounter for screening mammogram for breast cancer - MM DIGITAL SCREENING BILATERAL; Future  5. Ovarian failure - DG Bone Density; Future   Halina Maidens, MD [facility]  07/14/2016

## 2016-07-14 NOTE — Patient Instructions (Signed)
Breast Self-Awareness Practicing breast self-awareness may pick up problems early, prevent significant medical complications, and possibly save your life. By practicing breast self-awareness, you can become familiar with how your breasts look and feel and if your breasts are changing. This allows you to notice changes early. It can also offer you some reassurance that your breast health is good. One way to learn what is normal for your breasts and whether your breasts are changing is to do a breast self-exam. If you find a lump or something that was not present in the past, it is best to contact your caregiver right away. Other findings that should be evaluated by your caregiver include nipple discharge, especially if it is bloody; skin changes or reddening; areas where the skin seems to be pulled in (retracted); or new lumps and bumps. Breast pain is seldom associated with cancer (malignancy), but should also be evaluated by a caregiver. HOW TO PERFORM A BREAST SELF-EXAM The best time to examine your breasts is 5-7 days after your menstrual period is over. During menstruation, the breasts are lumpier, and it may be more difficult to pick up changes. If you do not menstruate, have reached menopause, or had your uterus removed (hysterectomy), you should examine your breasts at regular intervals, such as monthly. If you are breastfeeding, examine your breasts after a feeding or after using a breast pump. Breast implants do not decrease the risk for lumps or tumors, so continue to perform breast self-exams as recommended. Talk to your caregiver about how to determine the difference between the implant and breast tissue. Also, talk about the amount of pressure you should use during the exam. Over time, you will become more familiar with the variations of your breasts and more comfortable with the exam. A breast self-exam requires you to remove all your clothes above the waist. 1. Look at your breasts and nipples.  Stand in front of a mirror in a room with good lighting. With your hands on your hips, push your hands firmly downward. Look for a difference in shape, contour, and size from one breast to the other (asymmetry). Asymmetry includes puckers, dips, or bumps. Also, look for skin changes, such as reddened or scaly areas on the breasts. Look for nipple changes, such as discharge, dimpling, repositioning, or redness. 2. Carefully feel your breasts. This is best done either in the shower or tub while using soapy water or when flat on your back. Place the arm (on the side of the breast you are examining) above your head. Use the pads (not the fingertips) of your three middle fingers on your opposite hand to feel your breasts. Start in the underarm area and use  inch (2 cm) overlapping circles to feel your breast. Use 3 different levels of pressure (light, medium, and firm pressure) at each circle before moving to the next circle. The light pressure is needed to feel the tissue closest to the skin. The medium pressure will help to feel breast tissue a little deeper, while the firm pressure is needed to feel the tissue close to the ribs. Continue the overlapping circles, moving downward over the breast until you feel your ribs below your breast. Then, move one finger-width towards the center of the body. Continue to use the  inch (2 cm) overlapping circles to feel your breast as you move slowly up toward the collar bone (clavicle) near the base of the neck. Continue the up and down exam using all 3 pressures until you reach the   middle of the chest. Do this with each breast, carefully feeling for lumps or changes. 3.  Keep a written record with breast changes or normal findings for each breast. By writing this information down, you do not need to depend only on memory for size, tenderness, or location. Write down where you are in your menstrual cycle, if you are still menstruating. Breast tissue can have some lumps or  thick tissue. However, see your caregiver if you find anything that concerns you.  SEEK MEDICAL CARE IF:  You see a change in shape, contour, or size of your breasts or nipples.   You see skin changes, such as reddened or scaly areas on the breasts or nipples.   You have an unusual discharge from your nipples.   You feel a new lump or unusually thick areas.    This information is not intended to replace advice given to you by your health care provider. Make sure you discuss any questions you have with your health care provider.   Document Released: 09/25/2005 Document Revised: 09/11/2012 Document Reviewed: 01/10/2012 Elsevier Interactive Patient Education 2016 [employer]  

## 2016-07-18 LAB — MICROALBUMIN / CREATININE URINE RATIO
Creatinine, Urine: 34.6 mg/dL
Microalb/Creat Ratio: 17.1 mg/g creat (ref 0.0–30.0)
Microalbumin, Urine: 5.9 ug/mL

## 2016-07-18 LAB — LIPID PANEL
CHOLESTEROL TOTAL: 204 mg/dL — AB (ref 100–199)
Chol/HDL Ratio: 4.3 ratio units (ref 0.0–4.4)
HDL: 47 mg/dL (ref >39)
LDL CALC: 106 mg/dL — AB (ref 0–99)
TRIGLYCERIDES: 254 mg/dL — AB (ref 0–149)
VLDL CHOLESTEROL CAL: 51 mg/dL — AB (ref 5–40)

## 2016-07-18 LAB — HEMOGLOBIN A1C
ESTIMATED AVERAGE GLUCOSE: 154 mg/dL
Hgb A1c MFr Bld: 7 % — ABNORMAL HIGH (ref 4.8–5.6)

## 2016-07-18 LAB — TSH: TSH: 3.45 u[IU]/mL (ref 0.450–4.500)

## 2016-07-24 ENCOUNTER — Ambulatory Visit: Payer: 59

## 2016-07-27 ENCOUNTER — Ambulatory Visit
Admission: RE | Admit: 2016-07-27 | Discharge: 2016-07-27 | Disposition: A | Discharge status: Home / Self Care | Payer: 59 | Source: Ambulatory Visit | Attending: Internal Medicine | Admitting: Internal Medicine

## 2016-07-27 ENCOUNTER — Other Ambulatory Visit: Payer: Self-pay | Admitting: Internal Medicine

## 2016-07-27 DIAGNOSIS — Z1231 Encounter for screening mammogram for malignant neoplasm of breast: Secondary | ICD-10-CM

## 2016-07-27 DIAGNOSIS — E2839 Other primary ovarian failure: Secondary | ICD-10-CM | POA: Diagnosis not present

## 2016-08-02 ENCOUNTER — Other Ambulatory Visit: Payer: Self-pay | Admitting: Internal Medicine

## 2016-08-02 DIAGNOSIS — J302 Other seasonal allergic rhinitis: Secondary | ICD-10-CM

## 2016-08-02 MED ORDER — FEXOFENADINE-PSEUDOEPHED ER 180-240 MG PO TB24: 1.0000 | ORAL_TABLET | Freq: Every day | ORAL | 3 refills | Status: DC

## 2016-08-25 ENCOUNTER — Ambulatory Visit (INDEPENDENT_AMBULATORY_CARE_PROVIDER_SITE_OTHER): Payer: 59 | Admitting: Internal Medicine

## 2016-08-25 ENCOUNTER — Encounter: Payer: Self-pay | Admitting: Internal Medicine

## 2016-08-25 VITALS — BP 126/82 | HR 71 | Resp 16 | Ht 65.0 in | Wt 304.0 lb

## 2016-08-25 DIAGNOSIS — B009 Herpesviral infection, unspecified: Secondary | ICD-10-CM

## 2016-08-25 MED ORDER — ACYCLOVIR 5 % EX OINT: 1.0000 "application " | TOPICAL_OINTMENT | CUTANEOUS | 2 refills | Status: DC

## 2016-08-25 MED ORDER — VALACYCLOVIR HCL 1 G PO TABS: 1000.0000 mg | ORAL_TABLET | Freq: Two times a day (BID) | ORAL | 0 refills | Status: DC

## 2016-08-25 NOTE — Progress Notes (Signed)
    Date:  08/25/2016   Name:  Mary Macias   DOB:  1965/11/21   MRN:  [id number]   Chief Complaint: Nose Problem (sores in nose) Sores in nose with blisters and burning.  History of fever blisters but has never had prescription medications.  Review of Systems  Constitutional: Negative for chills, fatigue and fever.  Respiratory: Negative for chest tightness and shortness of breath.   Cardiovascular: Negative for chest pain.  Skin: Positive for rash (in nares).    Patient Active Problem List   Diagnosis Date Noted  . Type 2 diabetes mellitus without complication, without long-term current use of insulin ([place]) 08/18/2015  . 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 Environmental and seasonal allergies 07/08/2015  . Avitaminosis D 07/08/2015  . Beat, premature ventricular 02/11/2015  . Awareness of heartbeats 02/11/2015    Prior to Admission medications   Medication Sig Start Date End Date Taking? Authorizing Provider  Cholecalciferol (VITAMIN D3) 5000 units TABS Take 1 tablet by mouth daily.   Yes Historical Provider, MD  fexofenadine-pseudoephedrine (ALLEGRA-D 24) 180-240 MG 24 hr tablet Take 1 tablet by mouth daily. 08/02/16  Yes Glean Hess, MD  fluticasone (FLONASE) 50 MCG/ACT nasal spray Place 2 sprays into the nose daily.   Yes Historical Provider, MD  metFORMIN (GLUCOPHAGE-XR) 500 MG 24 hr tablet Take 1 tablet (500 mg total) by mouth daily with breakfast. 07/14/16  Yes Glean Hess, MD  nystatin cream (MYCOSTATIN) APPLY 1 APPLICATION TOPICALLY 2 (TWO) TIMES DAILY. 06/15/16  Yes Glean Hess, MD    Allergies  Allergen Reactions  . Povidone Iodine Rash    Other Reaction: Other reaction  . Cephalexin Rash  . Peanut-Containing Drug Products Rash    Past Surgical History:  Procedure Laterality Date  . ABDOMINAL HYSTERECTOMY    . CHOLECYSTECTOMY    . NASAL SINUS SURGERY    . TOTAL VAGINAL HYSTERECTOMY    . TUBAL LIGATION       Social History  Substance Use Topics  . Smoking status: Never Smoker  . Smokeless tobacco: Never Used  . Alcohol use No     Medication list has been reviewed and updated.   Physical Exam  Constitutional: She is oriented to person, place, and time. She appears well-developed. No distress.  HENT:  Head: Normocephalic and atraumatic.  Cardiovascular: Normal rate, regular rhythm and normal heart sounds.   Pulmonary/Chest: Effort normal and breath sounds normal. No respiratory distress.  Musculoskeletal: Normal range of motion.  Neurological: She is alert and oriented to person, place, and time.  Skin: Skin is warm and dry. No rash noted.  Vesicles in both nares; lips and face clear  Psychiatric: She has a normal mood and affect. Her behavior is normal. Thought content normal.  Nursing note and vitals reviewed.   BP 126/82   Pulse 71   Resp 16   Ht 5\' 5"  (1.651 m)   Wt (!) 304 lb (137.9 kg)   SpO2 96%   BMI 50.59 kg/m   Assessment and Plan: 1. Herpes simplex infection - acyclovir ointment (ZOVIRAX) 5 %; Apply 1 application topically every 3 (three) hours.  Dispense: 30 g; Refill: 2 - valACYclovir (VALTREX) 1000 MG tablet; Take 1 tablet (1,000 mg total) by mouth 2 (two) times daily.  Dispense: 20 tablet; Refill: 0   Halina Maidens, MD [facility]  08/25/2016

## 2016-08-25 NOTE — Patient Instructions (Signed)
At onset of fever blisters take Valtrex 2 tabs twice a day for one day.

## 2016-09-12 ENCOUNTER — Ambulatory Visit (INDEPENDENT_AMBULATORY_CARE_PROVIDER_SITE_OTHER): Payer: 59 | Admitting: Internal Medicine

## 2016-09-12 ENCOUNTER — Encounter: Payer: Self-pay | Admitting: Internal Medicine

## 2016-09-12 VITALS — BP 126/86 | HR 73 | Temp 97.8°F | Resp 16 | Ht 65.0 in | Wt 305.0 lb

## 2016-09-12 DIAGNOSIS — J0101 Acute recurrent maxillary sinusitis: Secondary | ICD-10-CM

## 2016-09-12 MED ORDER — LEVOFLOXACIN 500 MG PO TABS: 500.0000 mg | ORAL_TABLET | Freq: Every day | ORAL | 0 refills | Status: DC

## 2016-09-12 NOTE — Progress Notes (Signed)
Date:  09/12/2016   Name:  Mary Macias   DOB:  [birth year]   MRN:  [id number]   Chief Complaint: Sinus Problem (Sinus pain and pressure x 5 days with green and bloody mucus. Denies fever. ) Sinusitis  This is a new problem. The current episode started in the past 7 days. The problem has been gradually worsening since onset. There has been no fever. Associated symptoms include congestion, headaches and sinus pressure. Pertinent negatives include no chills, coughing, diaphoresis, ear pain, shortness of breath or sore throat. Past treatments include saline sprays.      Review of Systems  Constitutional: Negative for chills, diaphoresis and fever.  HENT: Positive for congestion, postnasal drip, sinus pain and sinus pressure. Negative for ear pain and sore throat.   Respiratory: Negative for cough, chest tightness, shortness of breath and wheezing.   Cardiovascular: Negative for chest pain.  Musculoskeletal: Negative for arthralgias.  Neurological: Positive for headaches. Negative for dizziness and syncope.  Hematological: Negative for adenopathy.    Patient Active Problem List   Diagnosis Date Noted  . Type 2 diabetes mellitus without complication, without long-term current use of insulin ([place]) 08/18/2015  . 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 Environmental and seasonal allergies 07/08/2015  . Avitaminosis D 07/08/2015  . Beat, premature ventricular 02/11/2015  . Awareness of heartbeats 02/11/2015    Prior to Admission medications   Medication Sig Start Date End Date Taking? Authorizing Provider  acyclovir ointment (ZOVIRAX) 5 % Apply 1 application topically every 3 (three) hours. 08/25/16  Yes Glean Hess, MD  Cholecalciferol (VITAMIN D3) 5000 units TABS Take 1 tablet by mouth daily.   Yes Historical Provider, MD  fexofenadine-pseudoephedrine (ALLEGRA-D 24) 180-240 MG 24 hr tablet Take 1 tablet by mouth daily. 08/02/16  Yes Glean Hess, MD  fluticasone (FLONASE) 50 MCG/ACT nasal spray Place 2 sprays into the nose daily.   Yes Historical Provider, MD  metFORMIN (GLUCOPHAGE-XR) 500 MG 24 hr tablet Take 1 tablet (500 mg total) by mouth daily with breakfast. 07/14/16  Yes Glean Hess, MD  nystatin cream (MYCOSTATIN) APPLY 1 APPLICATION TOPICALLY 2 (TWO) TIMES DAILY. 06/15/16  Yes Glean Hess, MD  valACYclovir (VALTREX) 1000 MG tablet Take 1 tablet (1,000 mg total) by mouth 2 (two) times daily. 08/25/16  Yes Glean Hess, MD    Allergies  Allergen Reactions  . Povidone Iodine Rash    Other Reaction: Other reaction  . Cephalexin Rash  . Peanut-Containing Drug Products Rash    Past Surgical History:  Procedure Laterality Date  . ABDOMINAL HYSTERECTOMY    . CHOLECYSTECTOMY    . NASAL SINUS SURGERY    . TOTAL VAGINAL HYSTERECTOMY    . TUBAL LIGATION      Social History  Substance Use Topics  . Smoking status: Never Smoker  . Smokeless tobacco: Never Used  . Alcohol use No     Medication list has been reviewed and updated.   Physical Exam  Constitutional: She is oriented to person, place, and time. She appears well-developed and well-nourished.  HENT:  Right Ear: External ear and ear canal normal. Tympanic membrane is not erythematous and not retracted.  Left Ear: External ear and ear canal normal. Tympanic membrane is not erythematous and not retracted.  Nose: Right sinus exhibits maxillary sinus tenderness. Right sinus exhibits no frontal sinus tenderness. Left sinus exhibits maxillary sinus tenderness. Left sinus exhibits no frontal sinus tenderness.  Mouth/Throat: Uvula  is midline and mucous membranes are normal. No oral lesions. No oropharyngeal exudate or posterior oropharyngeal erythema.  Cardiovascular: Normal rate, regular rhythm and normal heart sounds.   Pulmonary/Chest: Breath sounds normal. She has no wheezes. She has no rales.  Lymphadenopathy:    She has no cervical adenopathy.    Neurological: She is alert and oriented to person, place, and time.    BP 126/86   Pulse 73   Temp 97.8 F (36.6 C)   Resp 16   Ht 5\' 5"  (1.651 m)   Wt (!) 305 lb (138.3 kg)   SpO2 96%   BMI 50.75 kg/m   Assessment and Plan: 1. Acute recurrent maxillary sinusitis Continue allegra-D but hold flonase Fluids; increase humidity on CPAP - levofloxacin (LEVAQUIN) 500 MG tablet; Take 1 tablet (500 mg total) by mouth daily.  Dispense: 10 tablet; Refill: 0   Halina Maidens, MD [facility]  09/12/2016

## 2016-11-04 ENCOUNTER — Encounter: Payer: Self-pay | Admitting: Emergency Medicine

## 2016-11-04 ENCOUNTER — Ambulatory Visit
Admission: EM | Admit: 2016-11-04 | Discharge: 2016-11-04 | Disposition: A | Discharge status: Home / Self Care | Payer: 59 | Attending: Family Medicine | Admitting: Family Medicine

## 2016-11-04 DIAGNOSIS — J028 Acute pharyngitis due to other specified organisms: Secondary | ICD-10-CM | POA: Diagnosis not present

## 2016-11-04 LAB — RAPID STREP SCREEN (MED CTR MEBANE ONLY): STREPTOCOCCUS, GROUP A SCREEN (DIRECT): NEGATIVE

## 2016-11-04 MED ORDER — PENICILLIN G BENZATHINE 1200000 UNIT/2ML IM SUSP
1.2000 10*6.[IU] | Freq: Once | INTRAMUSCULAR | Status: AC
  Administered 2016-11-04: 1.2 10*6.[IU] via INTRAMUSCULAR

## 2016-11-04 MED ORDER — LIDOCAINE VISCOUS 2 % MT SOLN: OROMUCOSAL | 0 refills | Status: DC

## 2016-11-04 NOTE — ED Notes (Signed)
Patient shows no signs of adverse reaction to medication at this time.  

## 2016-11-04 NOTE — ED Provider Notes (Signed)
[facility]    CSN: [id number] Arrival date & time: 11/04/16  1028     History   Chief Complaint Chief Complaint  Patient presents with  . Sore Throat  . Fever    HPI Mary Macias is a 51 y.o. female.   The history is provided by the patient.  Sore Throat  This is a new problem. The current episode started more than 2 days ago. The problem occurs constantly. The problem has been gradually worsening. Pertinent negatives include no chest pain, no abdominal pain, no headaches and no shortness of breath. She has tried nothing for the symptoms.  Fever  Associated symptoms: no chest pain and no headaches     Past Medical History:  Diagnosis Date  . Allergy   . Diabetes mellitus without complication [facility])     Patient Active Problem List   Diagnosis Date Noted  . Type 2 diabetes mellitus without complication, without long-term current use of insulin ([place]) 08/18/2015  . 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 Environmental and seasonal allergies 07/08/2015  . Avitaminosis D 07/08/2015  . Beat, premature ventricular 02/11/2015  . Awareness of heartbeats 02/11/2015    Past Surgical History:  Procedure Laterality Date  . ABDOMINAL HYSTERECTOMY    . CHOLECYSTECTOMY    . NASAL SINUS SURGERY    . TOTAL VAGINAL HYSTERECTOMY    . TUBAL LIGATION      OB History    No data available       Home Medications    Prior to Admission medications   Medication Sig Start Date End Date Taking? Authorizing Provider  acyclovir ointment (ZOVIRAX) 5 % Apply 1 application topically every 3 (three) hours. 08/25/16   Glean Hess, MD  Cholecalciferol (VITAMIN D3) 5000 units TABS Take 1 tablet by mouth daily.    Historical Provider, MD  fexofenadine-pseudoephedrine (ALLEGRA-D 24) 180-240 MG 24 hr tablet Take 1 tablet by mouth daily. 08/02/16   Glean Hess, MD  fluticasone (FLONASE) 50 MCG/ACT nasal spray Place 2 sprays into the nose  daily.    Historical Provider, MD  lidocaine (XYLOCAINE) 2 % solution 20 ml gargle and spit q 6 hours prn sore throat 11/04/16   Norval Gable, MD  metFORMIN (GLUCOPHAGE-XR) 500 MG 24 hr tablet Take 1 tablet (500 mg total) by mouth daily with breakfast. 07/14/16   Glean Hess, MD  nystatin cream (MYCOSTATIN) APPLY 1 APPLICATION TOPICALLY 2 (TWO) TIMES DAILY. 06/15/16   Glean Hess, MD  valACYclovir (VALTREX) 1000 MG tablet Take 1 tablet (1,000 mg total) by mouth 2 (two) times daily. 08/25/16   Glean Hess, MD    Family History Family History  Problem Relation Age of Onset  . Breast cancer Paternal Grandmother   . Colon cancer Mother   . Hypertension Mother   . Diabetes Father   . Hypertension Father   . Hypertension Sister     Social History Social History  Substance Use Topics  . Smoking status: Never Smoker  . Smokeless tobacco: Never Used  . Alcohol use No     Allergies   Povidone iodine; Cephalexin; and Peanut-containing drug products   Review of Systems Review of Systems  Constitutional: Positive for fever.  Respiratory: Negative for shortness of breath.   Cardiovascular: Negative for chest pain.  Gastrointestinal: Negative for abdominal pain.  Neurological: Negative for headaches.     Physical Exam Triage Vital Signs ED Triage Vitals  Enc Vitals Group  BP 11/04/16 1046 127/74     Pulse Rate 11/04/16 1046 90     Resp 11/04/16 1046 17     Temp 11/04/16 1046 99.3 F (37.4 C)     Temp Source 11/04/16 1046 Oral     SpO2 11/04/16 1046 97 %     Weight 11/04/16 1045 (!) 302 lb (137 kg)     Height 11/04/16 1045 5\' 5"  (1.651 m)     Head Circumference --      Peak Flow --      Pain Score 11/04/16 1046 7     Pain Loc --      Pain Edu? --      Excl. in [place]? --    No data found.   Updated Vital Signs BP 127/74 (BP Location: Right Arm)   Pulse 90   Temp 99.3 F (37.4 C) (Oral)   Resp 17   Ht 5\' 5"  (1.651 m)   Wt (!) 302 lb (137 kg)   SpO2  97%   BMI 50.26 kg/m   Visual Acuity Right Eye Distance:   Left Eye Distance:   Bilateral Distance:    Right Eye Near:   Left Eye Near:    Bilateral Near:     Physical Exam  Constitutional: She appears well-developed and well-nourished. No distress.  HENT:  Head: Normocephalic and atraumatic.  Right Ear: Tympanic membrane, external ear and ear canal normal.  Left Ear: Tympanic membrane, external ear and ear canal normal.  Nose: No mucosal edema, rhinorrhea, nose lacerations, sinus tenderness, nasal deformity, septal deviation or nasal septal hematoma. No epistaxis.  No foreign bodies. Right sinus exhibits no maxillary sinus tenderness and no frontal sinus tenderness. Left sinus exhibits no maxillary sinus tenderness and no frontal sinus tenderness.  Mouth/Throat: Uvula is midline and mucous membranes are normal. Oropharyngeal exudate and posterior oropharyngeal erythema present. No posterior oropharyngeal edema or tonsillar abscesses. Tonsillar exudate.  Eyes: Conjunctivae and EOM are normal. Pupils are equal, round, and reactive to light. Right eye exhibits no discharge. Left eye exhibits no discharge. No scleral icterus.  Neck: Normal range of motion. Neck supple. No thyromegaly present.  Cardiovascular: Normal rate, regular rhythm and normal heart sounds.   Pulmonary/Chest: Effort normal and breath sounds normal. No respiratory distress. She has no wheezes. She has no rales.  Lymphadenopathy:    She has no cervical adenopathy.  Skin: She is not diaphoretic.  Nursing note and vitals reviewed.    UC Treatments / Results  Labs (all labs ordered are listed, but only abnormal results are displayed) Labs Reviewed  RAPID STREP SCREEN (NOT AT [facility])  CULTURE, GROUP A STREP [facility])    EKG  EKG Interpretation None       Radiology No results found.  Procedures Procedures (including critical care time)  Medications Ordered in UC Medications  penicillin g benzathine  (BICILLIN LA) 1200000 UNIT/2ML injection 1.2 Million Units (not administered)     Initial Impression / Assessment and Plan / UC Course  I have reviewed the triage vital signs and the nursing notes.  Pertinent labs & imaging results that were available during my care of the patient were reviewed by me and considered in my medical decision making (see chart for details).       Final Clinical Impressions(s) / UC Diagnoses   Final diagnoses:  Acute pharyngitis due to other specified organisms    New Prescriptions New Prescriptions   LIDOCAINE (XYLOCAINE) 2 % SOLUTION    20 ml  gargle and spit q 6 hours prn sore throat   1. Lab results and diagnosis reviewed with patient 2. rx as per orders above; reviewed possible side effects, interactions, risks and benefits  3. Given bicillin la 1.2 mU im x 1 4. Recommend supportive treatment with  5. Follow-up prn if symptoms worsen or don't improve   Norval Gable, MD 11/04/16 1155

## 2016-11-04 NOTE — ED Triage Notes (Signed)
Patient c/o sore throat and fever since Wed.  Patient also reports bodyaches.

## 2016-11-05 ENCOUNTER — Telehealth: Payer: Self-pay | Admitting: *Deleted

## 2016-11-05 MED ORDER — OSELTAMIVIR PHOSPHATE 75 MG PO CAPS: 75.0000 mg | ORAL_CAPSULE | Freq: Two times a day (BID) | ORAL | 0 refills | Status: DC

## 2016-11-05 NOTE — Telephone Encounter (Signed)
Patient called and left a message on the nurse line reporting flu like symptoms since her visit yesterday. Called patient and confirmed symptoms. Conferred with Dr Zenda Alpers about patient symptoms and he prescribed Tamiflu for patient. Tamiflu called into patient's pharmacy and patient notified of availability.

## 2016-11-07 LAB — CULTURE, GROUP A STREP (THRC)

## 2016-12-29 ENCOUNTER — Ambulatory Visit (INDEPENDENT_AMBULATORY_CARE_PROVIDER_SITE_OTHER): Payer: 59

## 2016-12-29 ENCOUNTER — Ambulatory Visit
Admission: EM | Admit: 2016-12-29 | Discharge: 2016-12-29 | Disposition: A | Discharge status: Home / Self Care | Payer: 59 | Attending: Family Medicine | Admitting: Family Medicine

## 2016-12-29 ENCOUNTER — Ambulatory Visit: Payer: 59

## 2016-12-29 DIAGNOSIS — S62639A Displaced fracture of distal phalanx of unspecified finger, initial encounter for closed fracture: Secondary | ICD-10-CM | POA: Diagnosis not present

## 2016-12-29 MED ORDER — METHYLPREDNISOLONE SODIUM SUCC 125 MG IJ SOLR: 125.0000 mg | Freq: Once | INTRAMUSCULAR | Status: DC

## 2016-12-29 MED ORDER — MELOXICAM 15 MG PO TABS: 15.0000 mg | ORAL_TABLET | Freq: Every day | ORAL | 0 refills | Status: DC

## 2016-12-29 NOTE — ED Provider Notes (Signed)
[facility]    CSN: [id number] Arrival date & time: 12/29/16  1624     History   Chief Complaint Chief Complaint  Patient presents with  . Finger Injury    HPI Mary Macias is a 51 y.o. female.   Patient is here because of dropping a box of tile on the middle finger by 5 hours ago. The child hit the palmar surface of her hand and middle finger. The swelling of the finger and bruising. She reports still stiff upon with the nailbed she does not smoke no known drug allergies she does have diabetes. She's had abdominal hysterectomy cholecystectomy nasal sinus surgery. Mother with colon cancer and hypertension father diabetes and hypertension.   The history is provided by the patient. No language interpreter was used.  Hand Pain  This is a new problem. The current episode started 3 to 5 hours ago. The problem occurs constantly. The problem has been gradually worsening. Pertinent negatives include no chest pain, no abdominal pain, no headaches and no shortness of breath. Nothing aggravates the symptoms. Nothing relieves the symptoms. The treatment provided mild relief.    Past Medical History:  Diagnosis Date  . Allergy   . Diabetes mellitus without complication [facility])     Patient Active Problem List   Diagnosis Date Noted  . Type 2 diabetes mellitus without complication, without long-term current use of insulin ([place]) 08/18/2015  . 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 Environmental and seasonal allergies 07/08/2015  . Avitaminosis D 07/08/2015  . Beat, premature ventricular 02/11/2015  . Awareness of heartbeats 02/11/2015    Past Surgical History:  Procedure Laterality Date  . ABDOMINAL HYSTERECTOMY    . CHOLECYSTECTOMY    . NASAL SINUS SURGERY    . TOTAL VAGINAL HYSTERECTOMY    . TUBAL LIGATION      OB History    No data available       Home Medications    Prior to Admission medications   Medication Sig Start Date  End Date Taking? Authorizing Provider  acyclovir ointment (ZOVIRAX) 5 % Apply 1 application topically every 3 (three) hours. 08/25/16   Glean Hess, MD  Cholecalciferol (VITAMIN D3) 5000 units TABS Take 1 tablet by mouth daily.    Historical Provider, MD  fexofenadine-pseudoephedrine (ALLEGRA-D 24) 180-240 MG 24 hr tablet Take 1 tablet by mouth daily. 08/02/16   Glean Hess, MD  fluticasone (FLONASE) 50 MCG/ACT nasal spray Place 2 sprays into the nose daily.    Historical Provider, MD  lidocaine (XYLOCAINE) 2 % solution 20 ml gargle and spit q 6 hours prn sore throat 11/04/16   Norval Gable, MD  meloxicam (MOBIC) 15 MG tablet Take 1 tablet (15 mg total) by mouth daily. 12/29/16   Frederich Cha, MD  metFORMIN (GLUCOPHAGE-XR) 500 MG 24 hr tablet Take 1 tablet (500 mg total) by mouth daily with breakfast. 07/14/16   Glean Hess, MD  nystatin cream (MYCOSTATIN) APPLY 1 APPLICATION TOPICALLY 2 (TWO) TIMES DAILY. 06/15/16   Glean Hess, MD  oseltamivir (TAMIFLU) 75 MG capsule Take 1 capsule (75 mg total) by mouth every 12 (twelve) hours. 11/05/16   Norval Gable, MD  valACYclovir (VALTREX) 1000 MG tablet Take 1 tablet (1,000 mg total) by mouth 2 (two) times daily. 08/25/16   Glean Hess, MD    Family History Family History  Problem Relation Age of Onset  . Breast cancer Paternal Grandmother   . Colon cancer Mother   .  Hypertension Mother   . Diabetes Father   . Hypertension Father   . Hypertension Sister     Social History Social History  Substance Use Topics  . Smoking status: Never Smoker  . Smokeless tobacco: Never Used  . Alcohol use No     Allergies   Povidone iodine; Cephalexin; and Peanut-containing drug products   Review of Systems Review of Systems  Respiratory: Negative for shortness of breath.   Cardiovascular: Negative for chest pain.  Gastrointestinal: Negative for abdominal pain.  Musculoskeletal: Positive for joint swelling and myalgias.    Neurological: Negative for headaches.  All other systems reviewed and are negative.    Physical Exam Triage Vital Signs ED Triage Vitals  Enc Vitals Group     BP 12/29/16 1658 119/79     Pulse Rate 12/29/16 1655 67     Resp 12/29/16 1655 18     Temp 12/29/16 1655 98.2 F (36.8 C)     Temp Source 12/29/16 1655 Oral     SpO2 12/29/16 1655 98 %     Weight 12/29/16 1658 292 lb (132.5 kg)     Height 12/29/16 1658 5\' 6"  (1.676 m)     Head Circumference --      Peak Flow --      Pain Score 12/29/16 1659 6     Pain Loc --      Pain Edu? --      Excl. in [place]? --    No data found.   Updated Vital Signs BP 119/79   Pulse 67   Temp 98.2 F (36.8 C) (Oral)   Resp 18   Ht 5\' 6"  (1.676 m)   Wt 292 lb (132.5 kg)   SpO2 98%   BMI 47.13 kg/m   Visual Acuity Right Eye Distance:   Left Eye Distance:   Bilateral Distance:    Right Eye Near:   Left Eye Near:    Bilateral Near:     Physical Exam  Constitutional: She is oriented to person, place, and time. She appears well-developed and well-nourished.  HENT:  Head: Normocephalic.  Eyes: Pupils are equal, round, and reactive to light.  Neck: Normal range of motion. Neck supple.  Pulmonary/Chest: Effort normal.  Musculoskeletal: She exhibits edema and tenderness.  Neurological: She is alert and oriented to person, place, and time.  Skin: Skin is warm and dry. She is not diaphoretic.  Psychiatric: She has a normal mood and affect.  Vitals reviewed.    UC Treatments / Results  Labs (all labs ordered are listed, but only abnormal results are displayed) Labs Reviewed - No data to display  EKG  EKG Interpretation None       Radiology Dg Finger Middle Left  Result Date: 12/29/2016 CLINICAL DATA:  Pt smashed finger with box of ceramic tiles equaling approx 50lbs this afternoon. Most pain and swelling in ant distal tuft area and into hand EXAM: LEFT MIDDLE FINGER 2+V COMPARISON:  None. FINDINGS: Three views of the left  third finger submitted. There is subtle nondisplaced fracture at the tip of distal phalanx best seen on lateral view. IMPRESSION: Subtle nondisplaced fracture at the tip of distal phalanx left third finger. Electronically Signed   By: Lahoma Crocker M.D.   On: 12/29/2016 17:38    Procedures Procedures (including critical care time)  Medications Ordered in UC Medications - No data to display   Initial Impression / Assessment and Plan / UC Course  I have reviewed the triage vital  signs and the nursing notes.  Pertinent labs & imaging results that were available during my care of the patient were reviewed by me and considered in my medical decision making (see chart for details).    We'll place faint patient and a distal finger splint for the left middle finger will add Mobic 15 mg for pain patient declined any narcotic analgesic at this time.   Final Clinical Impressions(s) / UC Diagnoses   Final diagnoses:  Closed fracture of tuft of distal phalanx of finger    New Prescriptions New Prescriptions   MELOXICAM (MOBIC) 15 MG TABLET    Take 1 tablet (15 mg total) by mouth daily.     Frederich Cha, MD 12/29/16 1806

## 2016-12-29 NOTE — ED Triage Notes (Signed)
Pt states she got her left 3rd finger smashed in between a stack of ceramic tiles today and has ecchymosis and swelling noted to the tip of the finger.

## 2017-01-03 ENCOUNTER — Ambulatory Visit: Payer: 59 | Admitting: Internal Medicine

## 2017-03-26 IMAGING — MG MM DIGITAL SCREENING BILAT W/ TOMO W/ CAD
8 of 12 series · 8 of 28 positions shown · non-contrast
Comparison: Previous exam(s).

CLINICAL DATA: Screening.

EXAM:
DIGITAL SCREENING BILATERAL MAMMOGRAM WITH 3D TOMO WITH CAD

[R MLO synth-2D]
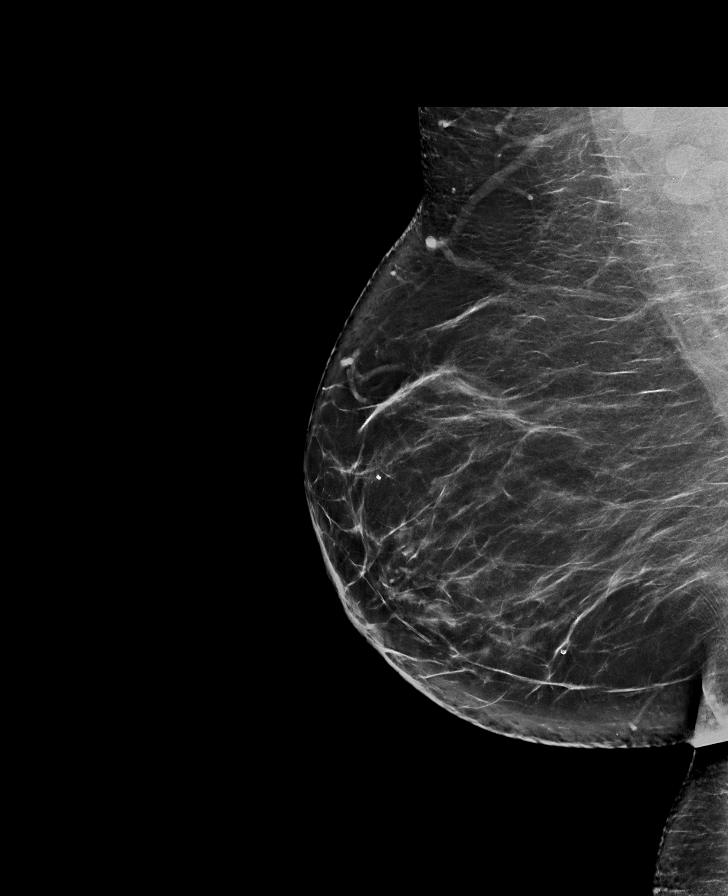

[R CC synth-2D]
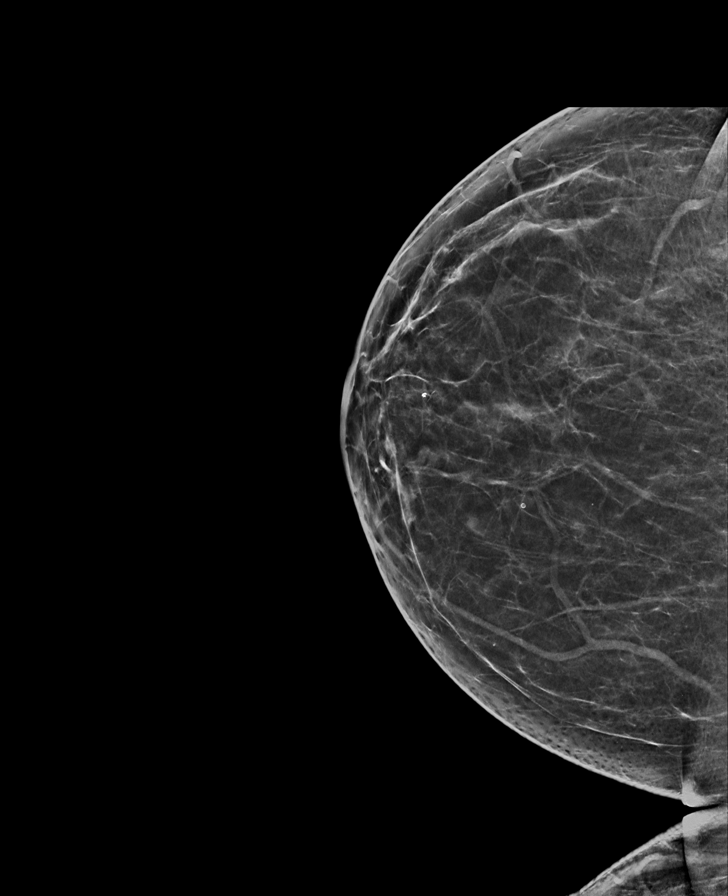

[L MLO]
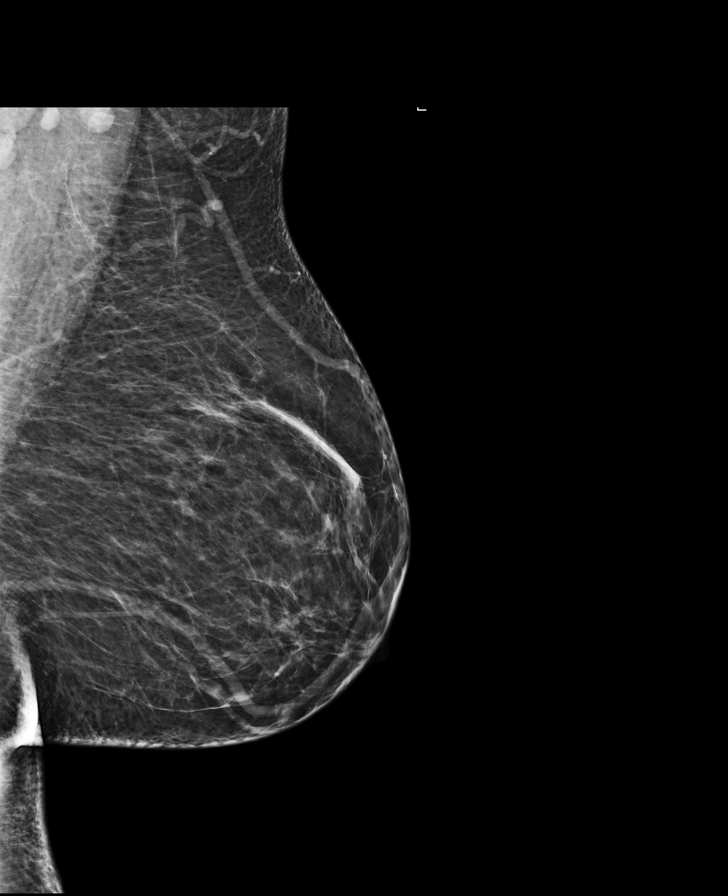

[R MLO]
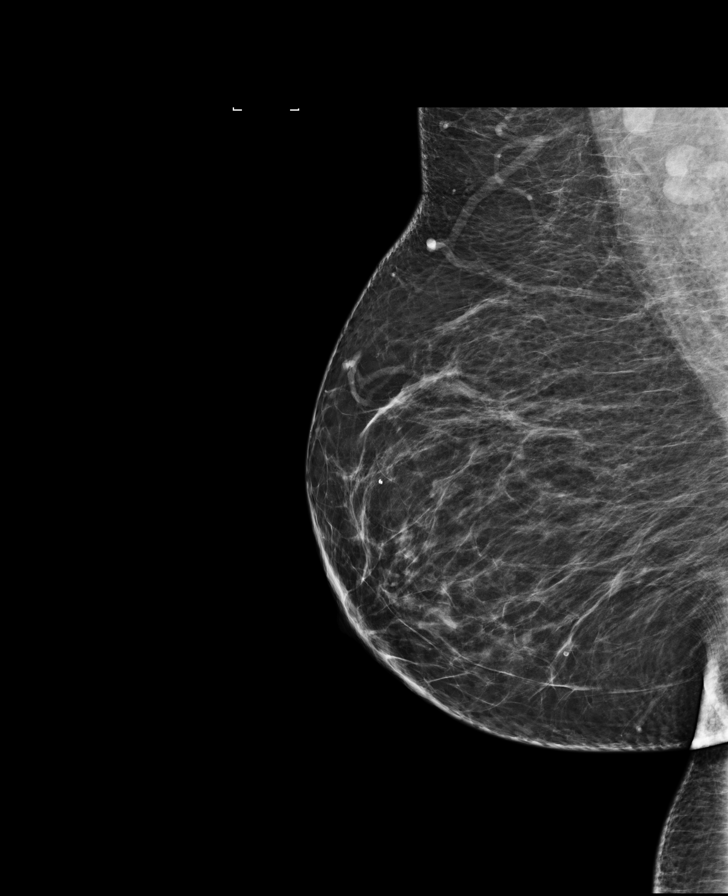

[R CC]
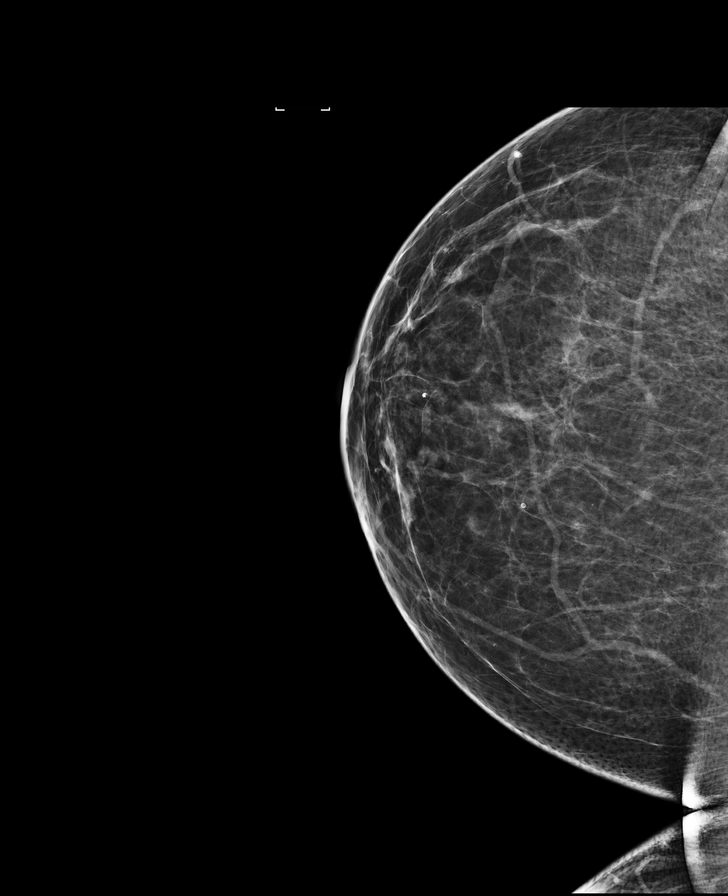

[L MLO synth-2D]
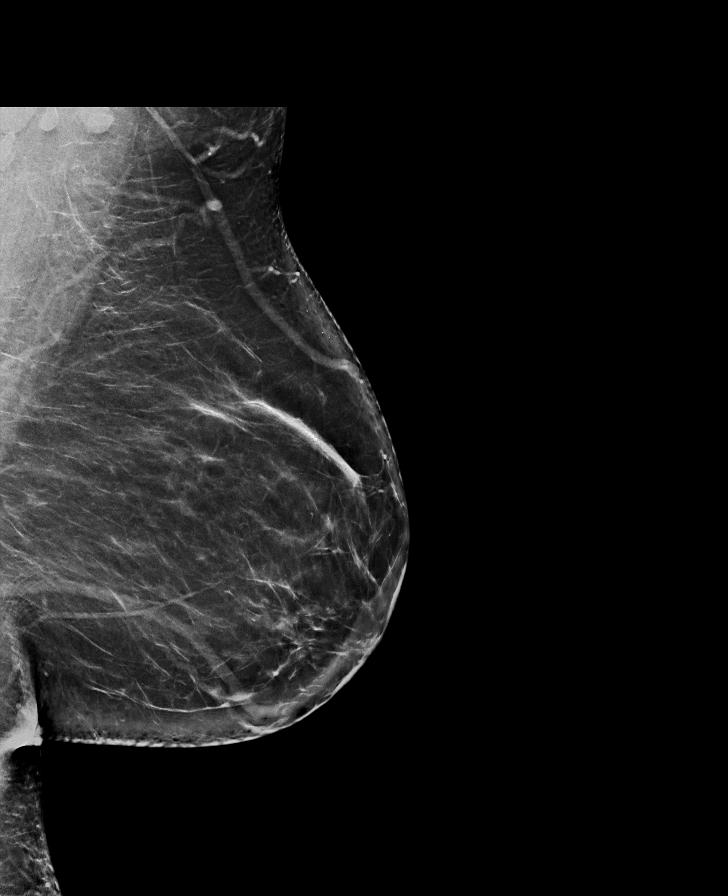

[L CC]
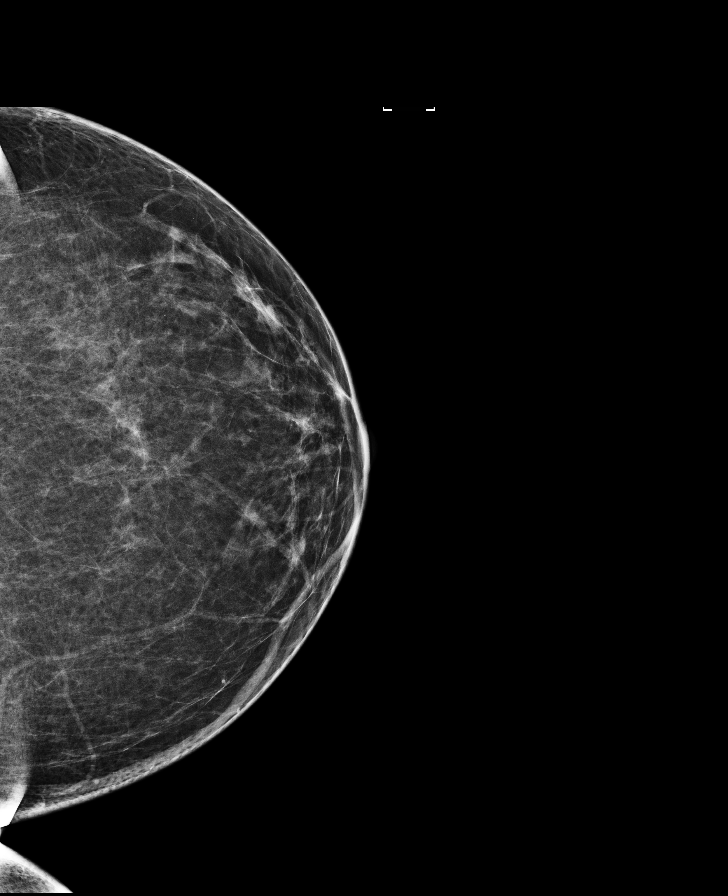

[L CC synth-2D]
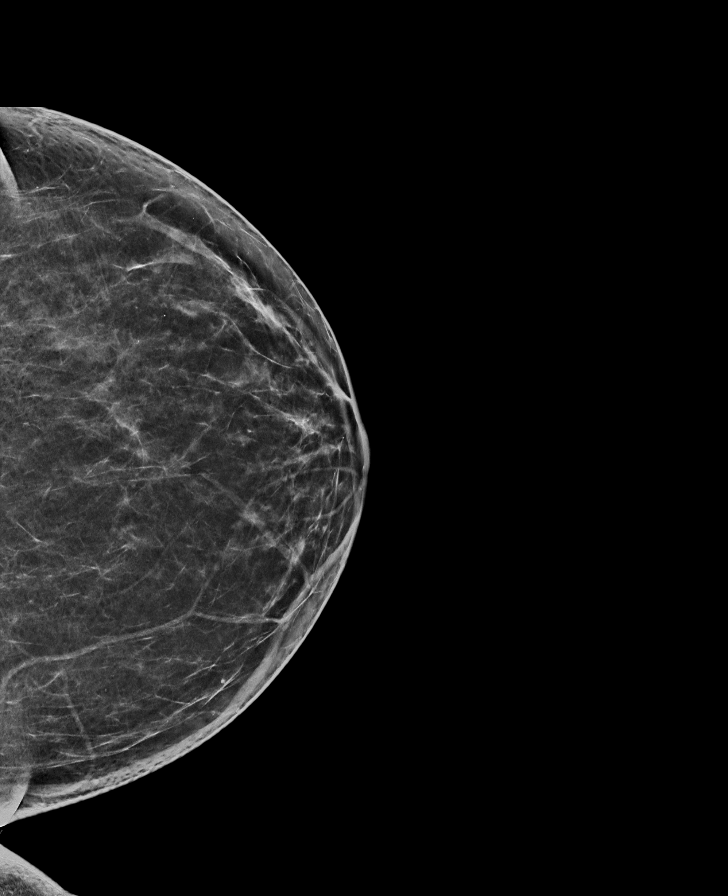

[8 of 28 positions shown; findings below may reference images not displayed]

ACR Breast Density Category b: There are scattered areas of
fibroglandular density.
FINDINGS: There are no findings suspicious for malignancy. Images were
processed with CAD.
IMPRESSION: No mammographic evidence of malignancy. A result letter of this
screening mammogram will be mailed directly to the patient.

RECOMMENDATION:
Screening mammogram in one year. (Code:55-L-23V)

BI-RADS CATEGORY  1: Negative.

## 2017-07-12 ENCOUNTER — Encounter: Payer: Self-pay | Admitting: *Deleted

## 2017-07-13 ENCOUNTER — Ambulatory Visit
Admission: RE | Admit: 2017-07-13 | Discharge: 2017-07-13 | Disposition: A | Discharge status: Home / Self Care | Payer: 59 | Source: Ambulatory Visit | Attending: Gastroenterology | Admitting: Gastroenterology

## 2017-07-13 ENCOUNTER — Ambulatory Visit: Payer: 59 | Admitting: Anesthesiology

## 2017-07-13 ENCOUNTER — Encounter
Admission: RE | Disposition: A | Discharge status: Home / Self Care | Payer: Self-pay | Source: Ambulatory Visit | Attending: Gastroenterology

## 2017-07-13 ENCOUNTER — Encounter: Payer: Self-pay | Admitting: *Deleted

## 2017-07-13 DIAGNOSIS — E119 Type 2 diabetes mellitus without complications: Secondary | ICD-10-CM | POA: Diagnosis not present

## 2017-07-13 DIAGNOSIS — Z1211 Encounter for screening for malignant neoplasm of colon: Secondary | ICD-10-CM | POA: Diagnosis not present

## 2017-07-13 DIAGNOSIS — Z7984 Long term (current) use of oral hypoglycemic drugs: Secondary | ICD-10-CM | POA: Diagnosis not present

## 2017-07-13 DIAGNOSIS — Z79899 Other long term (current) drug therapy: Secondary | ICD-10-CM | POA: Diagnosis not present

## 2017-07-13 DIAGNOSIS — Z800 Family history of malignant neoplasm of digestive organs: Secondary | ICD-10-CM | POA: Insufficient documentation

## 2017-07-13 DIAGNOSIS — Z888 Allergy status to other drugs, medicaments and biological substances status: Secondary | ICD-10-CM | POA: Diagnosis not present

## 2017-07-13 DIAGNOSIS — G4730 Sleep apnea, unspecified: Secondary | ICD-10-CM | POA: Insufficient documentation

## 2017-07-13 DIAGNOSIS — Z9989 Dependence on other enabling machines and devices: Secondary | ICD-10-CM | POA: Diagnosis not present

## 2017-07-13 DIAGNOSIS — Z91010 Allergy to peanuts: Secondary | ICD-10-CM | POA: Diagnosis not present

## 2017-07-13 DIAGNOSIS — K5730 Diverticulosis of large intestine without perforation or abscess without bleeding: Secondary | ICD-10-CM | POA: Insufficient documentation

## 2017-07-13 DIAGNOSIS — K635 Polyp of colon: Secondary | ICD-10-CM | POA: Insufficient documentation

## 2017-07-13 DIAGNOSIS — K621 Rectal polyp: Secondary | ICD-10-CM | POA: Insufficient documentation

## 2017-07-13 HISTORY — PX: COLONOSCOPY WITH PROPOFOL: SHX5780

## 2017-07-13 HISTORY — DX: Sleep apnea, unspecified: G47.30

## 2017-07-13 SURGERY — COLONOSCOPY WITH PROPOFOL
Anesthesia: General

## 2017-07-13 MED ORDER — PROPOFOL 500 MG/50ML IV EMUL
INTRAVENOUS | Status: AC
  Filled 2017-07-13: qty 50

## 2017-07-13 MED ORDER — SODIUM CHLORIDE 0.9 % IV SOLN: INTRAVENOUS | Status: DC

## 2017-07-13 MED ORDER — PHENYLEPHRINE HCL 10 MG/ML IJ SOLN
INTRAMUSCULAR | Status: DC | PRN
Start: 2017-07-13 — End: 2017-07-13
  Administered 2017-07-13 (×3): 100 ug via INTRAVENOUS

## 2017-07-13 MED ORDER — PROPOFOL 10 MG/ML IV BOLUS
INTRAVENOUS | Status: DC | PRN
  Administered 2017-07-13: 50 mg via INTRAVENOUS
  Administered 2017-07-13: 20 mg via INTRAVENOUS
  Administered 2017-07-13 (×2): 40 mg via INTRAVENOUS

## 2017-07-13 MED ORDER — PROPOFOL 500 MG/50ML IV EMUL
INTRAVENOUS | Status: DC | PRN
  Administered 2017-07-13: 150 ug/kg/min via INTRAVENOUS

## 2017-07-13 MED ORDER — PROPOFOL 10 MG/ML IV BOLUS
INTRAVENOUS | Status: AC
  Filled 2017-07-13: qty 20

## 2017-07-13 MED ORDER — LIDOCAINE HCL (PF) 2 % IJ SOLN
INTRAMUSCULAR | Status: AC
  Filled 2017-07-13: qty 10

## 2017-07-13 MED ORDER — PROPOFOL 500 MG/50ML IV EMUL
INTRAVENOUS | Status: AC
Start: 2017-07-13 — End: 2017-07-13
  Filled 2017-07-13: qty 50

## 2017-07-13 MED ORDER — SODIUM CHLORIDE 0.9 % IV SOLN
INTRAVENOUS | Status: DC
  Administered 2017-07-13: 12:00:00 via INTRAVENOUS

## 2017-07-13 NOTE — Anesthesia Procedure Notes (Signed)
Date/Time: 07/13/2017 12:57 PM Performed by: Darlyne Russian Pre-anesthesia Checklist: Patient identified, Emergency Drugs available, Suction available, Patient being monitored and Timeout performed Oxygen Delivery Method: Nasal cannula Placement Confirmation: positive ETCO2

## 2017-07-13 NOTE — Anesthesia Preprocedure Evaluation (Signed)
Anesthesia Evaluation  Patient identified by MRN, date of birth, ID band Patient awake    Reviewed: Allergy & Precautions, NPO status , Patient's Chart, lab work & pertinent test results  History of Anesthesia Complications Negative for: history of anesthetic complications  Airway Mallampati: III       Dental   Pulmonary sleep apnea and Continuous Positive Airway Pressure Ventilation , neg COPD,           Cardiovascular (-) hypertension(-) Past MI and (-) CHF (-) dysrhythmias (-) Valvular Problems/Murmurs     Neuro/Psych neg Seizures    GI/Hepatic Neg liver ROS, neg GERD  ,  Endo/Other  diabetes, Type 2, Oral Hypoglycemic AgentsMorbid obesity  Renal/GU negative Renal ROS     Musculoskeletal   Abdominal   Peds  Hematology   Anesthesia Other Findings   Reproductive/Obstetrics                             Anesthesia Physical Anesthesia Plan  ASA: III  Anesthesia Plan: General   Post-op Pain Management:    Induction: Intravenous  PONV Risk Score and Plan: Propofol infusion  Airway Management Planned:   Additional Equipment:   Intra-op Plan:   Post-operative Plan:   Informed Consent: I have reviewed the patients History and Physical, chart, labs and discussed the procedure including the risks, benefits and alternatives for the proposed anesthesia with the patient or authorized representative who has indicated his/her understanding and acceptance.     Plan Discussed with:   Anesthesia Plan Comments:         Anesthesia Quick Evaluation

## 2017-07-13 NOTE — H&P (Signed)
Outpatient short stay form Pre-procedure 07/13/2017 12:20 PM Lollie Sails MD  Primary Physician: Dr. Halina Maidens  Reason for visit:  Colonoscopy  History of present illness:  Patient is a 51 year old female presenting today as above. She tolerated her prep well. She takes no aspirin or blood thinning agents. There is family history of colon cancer in mother as well as an aunt.    Current Facility-Administered Medications:  .  0.9 %  sodium chloride infusion, , Intravenous, Continuous, Lollie Sails, MD, Last Rate: 20 mL/hr at 07/13/17 1203 .  0.9 %  sodium chloride infusion, , Intravenous, Continuous, Lollie Sails, MD  Prescriptions Prior to Admission  Medication Sig Dispense Refill Last Dose  . acyclovir ointment (ZOVIRAX) 5 % Apply 1 application topically every 3 (three) hours. 30 g 2 Past Week at Unknown time  . Cholecalciferol (VITAMIN D3) 5000 units TABS Take 1 tablet by mouth daily.   Past Week at Unknown time  . fexofenadine-pseudoephedrine (ALLEGRA-D 24) 180-240 MG 24 hr tablet Take 1 tablet by mouth daily. 90 tablet 3 Past Week at Unknown time  . meloxicam (MOBIC) 15 MG tablet Take 1 tablet (15 mg total) by mouth daily. 30 tablet 0 Past Week at Unknown time  . fluticasone (FLONASE) 50 MCG/ACT nasal spray Place 2 sprays into the nose daily.   Taking  . lidocaine (XYLOCAINE) 2 % solution 20 ml gargle and spit q 6 hours prn sore throat 100 mL 0   . metFORMIN (GLUCOPHAGE-XR) 500 MG 24 hr tablet Take 1 tablet (500 mg total) by mouth daily with breakfast. (Patient not taking: Reported on 07/13/2017) 90 tablet 1 Not Taking at Unknown time  . nystatin cream (MYCOSTATIN) APPLY 1 APPLICATION TOPICALLY 2 (TWO) TIMES DAILY. 30 g 0 Taking  . oseltamivir (TAMIFLU) 75 MG capsule Take 1 capsule (75 mg total) by mouth every 12 (twelve) hours. 10 capsule 0   . valACYclovir (VALTREX) 1000 MG tablet Take 1 tablet (1,000 mg total) by mouth 2 (two) times daily. 20 tablet 0 Taking      Allergies  Allergen Reactions  . Povidone Iodine Rash    Other Reaction: Other reaction  . Cephalexin Rash  . Peanut-Containing Drug Products Rash     Past Medical History:  Diagnosis Date  . Allergy   . Diabetes mellitus without complication ([place])   . Sleep apnea    cpap    Review of systems:      Physical Exam    Heart and lungs: Regular rate and rhythm without rub or gallop, lungs are bilaterally clear.    HEENT: Normocephalic atraumatic eyes are anicteric    Other:     Pertinant exam for procedure: Soft nontender nondistended bowel sounds positive normoactive    Planned proceedures: Colonoscopy and indicated procedures. I have discussed the risks benefits and complications of procedures to include not limited to bleeding, infection, perforation and the risk of sedation and the patient wishes to proceed.    Lollie Sails, MD Gastroenterology 07/13/2017  12:20 PM

## 2017-07-13 NOTE — Anesthesia Post-op Follow-up Note (Signed)
Anesthesia QCDR form completed.        

## 2017-07-13 NOTE — Anesthesia Postprocedure Evaluation (Signed)
Anesthesia Post Note  Patient: Mary Macias  Procedure(s) Performed: COLONOSCOPY WITH PROPOFOL (N/A )  Patient location during evaluation: Endoscopy Anesthesia Type: General Level of consciousness: awake and alert and oriented Pain management: pain level controlled Vital Signs Assessment: post-procedure vital signs reviewed and stable Respiratory status: spontaneous breathing, nonlabored ventilation and respiratory function stable Cardiovascular status: blood pressure returned to baseline and stable Postop Assessment: no signs of nausea or vomiting Anesthetic complications: no     Last Vitals:  Vitals:   07/13/17 1345 07/13/17 1405  BP: 112/75 116/75  Pulse:    Resp:    Temp:    SpO2:      Last Pain:  Vitals:   07/13/17 1335  TempSrc: Tympanic                 Oris Staffieri

## 2017-07-13 NOTE — Transfer of Care (Signed)
Immediate Anesthesia Transfer of Care Note  Patient: Mary Macias  Procedure(s) Performed: COLONOSCOPY WITH PROPOFOL (N/A )  Patient Location: PACU  Anesthesia Type:General  Level of Consciousness: awake, alert , oriented and patient cooperative  Airway & Oxygen Therapy: Patient Spontanous Breathing and Patient connected to nasal cannula oxygen  Post-op Assessment: Report given to RN and Post -op Vital signs reviewed and stable  Post vital signs: Reviewed and stable  Last Vitals:  Vitals:   07/13/17 1145 07/13/17 1335  BP: 132/82 117/73  Pulse: 68 74  Resp: 16   Temp: 36.7 C 36.5 C  SpO2: 97% 100%    Last Pain:  Vitals:   07/13/17 1335  TempSrc: Tympanic         Complications: No apparent anesthesia complications

## 2017-07-13 NOTE — Op Note (Signed)
[facility] Patient Name: Mary Macias Procedure Date: 07/13/2017 1:03 PM MRN: [id number] Account #: [id number] Date of Birth: July 11, 1966 Admit Type: Outpatient Age: 51 Room: [facility] ENDO ROOM 4 Gender: Female Note Status: Finalized Procedure:            Colonoscopy Indications:          Family history of colon cancer in a first-degree                        relative Providers:            Lollie Sails, MD Medicines:            Monitored Anesthesia Care Complications:        No immediate complications. Procedure:            Pre-Anesthesia Assessment:                       - ASA Grade Assessment: III - A patient with severe                        systemic disease.                       After obtaining informed consent, the colonoscope was                        passed under direct vision. Throughout the procedure,                        the patient's blood pressure, pulse, and oxygen                        saturations were monitored continuously. The                        Colonoscope was introduced through the anus and                        advanced to the the cecum, identified by appendiceal                        orifice and ileocecal valve. The quality of the bowel                        preparation was fair. Findings:      A few small-mouthed diverticula were found in the sigmoid colon and       descending colon.      Two sessile polyps were found in the descending colon. The polyps were 2       to 3 mm in size. These polyps were removed with a cold biopsy forceps.       Resection and retrieval were complete.      Six sessile polyps were found in the mid sigmoid colon. The polyps were       1 to 2 mm in size. These polyps were removed with a cold biopsy forceps.       Resection and retrieval were complete.      A 2 mm polyp was found in the rectum. The polyp was sessile. The polyp       was removed with a cold biopsy forceps.  Resection and retrieval were       complete.      The retroflexed view of the distal rectum and anal verge was normal and       showed no anal or rectal abnormalities.      The digital rectal exam was normal. Impression:           - Preparation of the colon was fair.                       - Diverticulosis in the sigmoid colon and in the                        descending colon.                       - Two 2 to 3 mm polyps in the descending colon, removed                        with a cold biopsy forceps. Resected and retrieved.                       - Six 1 to 2 mm polyps in the mid sigmoid colon,                        removed with a cold biopsy forceps. Resected and                        retrieved.                       - One 2 mm polyp in the rectum, removed with a cold                        biopsy forceps. Resected and retrieved.                       - The distal rectum and anal verge are normal on                        retroflexion view. Recommendation:       - Discharge patient to home.                       - Await pathology results.                       - Telephone GI clinic for pathology results in 1 week. Procedure Code(s):    --- Professional ---                       [phone], Colonoscopy, flexible; with biopsy, single or                        multiple Diagnosis Code(s):    --- Professional ---                       D12.4, Benign neoplasm of descending colon                       D12.5, Benign neoplasm of sigmoid colon  K62.1, Rectal polyp                       Z80.0, Family history of malignant neoplasm of                        digestive organs                       K57.30, Diverticulosis of large intestine without                        perforation or abscess without bleeding CPT copyright 2016 American Medical Association. All rights reserved. The codes documented in this report are preliminary and upon coder review may  be revised to meet  current compliance requirements. Lollie Sails, MD 07/13/2017 1:43:42 PM This report has been signed electronically. Number of Addenda: 0 Note Initiated On: 07/13/2017 1:03 PM Scope Withdrawal Time: 0 hours 13 minutes 7 seconds  Total Procedure Duration: 0 hours 26 minutes 9 seconds       [facility]

## 2017-07-16 ENCOUNTER — Encounter: Payer: Self-pay | Admitting: Internal Medicine

## 2017-07-16 ENCOUNTER — Encounter: Payer: Self-pay | Admitting: Gastroenterology

## 2017-07-16 DIAGNOSIS — K635 Polyp of colon: Secondary | ICD-10-CM | POA: Insufficient documentation

## 2017-07-16 LAB — SURGICAL PATHOLOGY

## 2017-07-26 ENCOUNTER — Encounter: Payer: Self-pay | Admitting: Internal Medicine

## 2017-07-26 ENCOUNTER — Ambulatory Visit (INDEPENDENT_AMBULATORY_CARE_PROVIDER_SITE_OTHER): Payer: 59 | Admitting: Internal Medicine

## 2017-07-26 VITALS — BP 138/68 | HR 53 | Temp 97.9°F | Ht 66.0 in | Wt 293.0 lb

## 2017-07-26 DIAGNOSIS — E119 Type 2 diabetes mellitus without complications: Secondary | ICD-10-CM

## 2017-07-26 DIAGNOSIS — J3089 Other allergic rhinitis: Secondary | ICD-10-CM

## 2017-07-26 DIAGNOSIS — J0120 Acute ethmoidal sinusitis, unspecified: Secondary | ICD-10-CM

## 2017-07-26 MED ORDER — AMOXICILLIN-POT CLAVULANATE 875-125 MG PO TABS: 1.0000 | ORAL_TABLET | Freq: Two times a day (BID) | ORAL | 0 refills | Status: AC

## 2017-07-26 NOTE — Progress Notes (Signed)
Date:  07/26/2017   Name:  Mary Macias   DOB:  November 26, 1965   MRN:  [id number]   Chief Complaint: Sinusitis (X 1.5 weeks. Nasal congestion - blood and green coming out. Small cough. And tired. )  Sinusitis  This is a recurrent problem. The current episode started in the past 7 days. The problem has been gradually worsening since onset. There has been no fever. Associated symptoms include congestion and sinus pressure. Pertinent negatives include no chills, headaches, shortness of breath or sore throat. Past treatments include oral decongestants and spray decongestants. The treatment provided mild relief.   DM - she has stopped taking metformin due to improvement in A1c with diet and weight loss. She's been followed regularly at the employee wellness at [facility] [employer] where she works. She states her last A1c was below 6 back in June. Unfortunately, she has gained back about 10 pounds. She plans to return here in about 3 months for labs and general recheck. At that time will determine if she needs to resume metformin.   Review of Systems  Constitutional: Positive for fatigue. Negative for chills and fever.  HENT: Positive for congestion, postnasal drip and sinus pressure. Negative for sore throat and trouble swallowing.   Eyes: Positive for visual disturbance.  Respiratory: Negative for chest tightness and shortness of breath.   Cardiovascular: Negative for chest pain and palpitations.  Neurological: Negative for dizziness and headaches.    Patient Active Problem List   Diagnosis Date Noted  . Colon polyp, hyperplastic 07/16/2017  . Type 2 diabetes mellitus without complication, without long-term current use of insulin ([place]) 08/18/2015  . 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 Environmental and seasonal allergies 07/08/2015  . Avitaminosis D 07/08/2015  . Beat, premature ventricular 02/11/2015  . Awareness of heartbeats 02/11/2015    Prior to Admission  medications   Medication Sig Start Date End Date Taking? Authorizing Provider  acyclovir ointment (ZOVIRAX) 5 % Apply 1 application topically every 3 (three) hours. 08/25/16  Yes Glean Hess, MD  Cholecalciferol (VITAMIN D3) 5000 units TABS Take 1 tablet by mouth daily.   Yes [provider]  fexofenadine-pseudoephedrine (ALLEGRA-D 24) 180-240 MG 24 hr tablet Take 1 tablet by mouth daily. 08/02/16  Yes Glean Hess, MD  meloxicam (MOBIC) 15 MG tablet Take 1 tablet (15 mg total) by mouth daily. 12/29/16  Yes Frederich Cha, MD  nystatin cream (MYCOSTATIN) APPLY 1 APPLICATION TOPICALLY 2 (TWO) TIMES DAILY. 06/15/16  Yes Glean Hess, MD  triamcinolone (NASACORT ALLERGY 24HR) 55 MCG/ACT AERO nasal inhaler Place 2 sprays into the nose daily.   Yes [provider]  valACYclovir (VALTREX) 1000 MG tablet Take 1 tablet (1,000 mg total) by mouth 2 (two) times daily. 08/25/16  Yes Glean Hess, MD  metFORMIN (GLUCOPHAGE-XR) 500 MG 24 hr tablet Take 1 tablet (500 mg total) by mouth daily with breakfast. Patient not taking: Reported on 07/26/2017 07/14/16   Glean Hess, MD    Allergies  Allergen Reactions  . Povidone Iodine Rash    Other Reaction: Other reaction  . Cephalexin Rash  . Peanut-Containing Drug Products Rash    Past Surgical History:  Procedure Laterality Date  . ABDOMINAL HYSTERECTOMY    . CHOLECYSTECTOMY    . COLONOSCOPY WITH PROPOFOL N/A 07/13/2017   Procedure: COLONOSCOPY WITH PROPOFOL;  Surgeon: Lollie Sails, MD;  Location: [facility] ENDOSCOPY;  Service: Endoscopy;  Laterality: N/A;  . NASAL SINUS SURGERY    .  TOTAL VAGINAL HYSTERECTOMY    . TUBAL LIGATION      Social History  Substance Use Topics  . Smoking status: Never Smoker  . Smokeless tobacco: Never Used  . Alcohol use No     Medication list has been reviewed and updated.  PHQ 2/9 Scores 07/26/2017 12/03/2015  PHQ - 2 Score 0 0    Physical Exam  Constitutional: She is  oriented to person, place, and time. She appears well-developed and well-nourished.  HENT:  Right Ear: External ear and ear canal normal. Tympanic membrane is not erythematous and not retracted.  Left Ear: External ear and ear canal normal. Tympanic membrane is not erythematous and not retracted.  Nose: Right sinus exhibits no maxillary sinus tenderness and no frontal sinus tenderness. Left sinus exhibits no maxillary sinus tenderness and no frontal sinus tenderness.  Mouth/Throat: Uvula is midline and mucous membranes are normal. No oral lesions. No oropharyngeal exudate or posterior oropharyngeal erythema.  Cardiovascular: Normal rate, regular rhythm and normal heart sounds.   Pulmonary/Chest: Breath sounds normal. She has no wheezes. She has no rales.  Lymphadenopathy:    She has no cervical adenopathy.  Neurological: She is alert and oriented to person, place, and time.    BP 138/68   Pulse (!) 53   Temp 97.9 F (36.6 C) (Oral)   Ht 5\' 6"  (1.676 m)   Wt 293 lb (132.9 kg)   SpO2 100%   BMI 47.29 kg/m   Assessment and Plan: 1. Acute non-recurrent ethmoidal sinusitis Continue Nasacort spray and Allegra - amoxicillin-clavulanate (AUGMENTIN) 875-125 MG tablet; Take 1 tablet by mouth 2 (two) times daily.  Dispense: 20 tablet; Refill: 0  2. Environmental and seasonal allergies Now getting allergy injections from Dr. Kathyrn Sheriff   Meds ordered this encounter  Medications  . amoxicillin-clavulanate (AUGMENTIN) 875-125 MG tablet    Sig: Take 1 tablet by mouth 2 (two) times daily.    Dispense:  20 tablet    Refill:  0    Partially dictated using Editor, commissioning. Any errors are unintentional.  Halina Maidens, MD [facility]  07/26/2017

## 2017-08-08 ENCOUNTER — Emergency Department
Admission: EM | Admit: 2017-08-08 | Discharge: 2017-08-08 | Disposition: A | Discharge status: Home / Self Care | Payer: 59 | Attending: Student in an Organized Health Care Education/Training Program | Admitting: Student in an Organized Health Care Education/Training Program

## 2017-08-08 ENCOUNTER — Emergency Department: Payer: 59

## 2017-08-08 ENCOUNTER — Encounter: Payer: Self-pay | Admitting: Emergency Medicine

## 2017-08-08 ENCOUNTER — Ambulatory Visit (INDEPENDENT_AMBULATORY_CARE_PROVIDER_SITE_OTHER)
Admission: EM | Admit: 2017-08-08 | Discharge: 2017-08-08 | Disposition: A | Discharge status: Other Acute Inpatient Hospital | Payer: 59 | Source: Home / Self Care | Attending: Family Medicine | Admitting: Family Medicine

## 2017-08-08 DIAGNOSIS — Z79899 Other long term (current) drug therapy: Secondary | ICD-10-CM | POA: Insufficient documentation

## 2017-08-08 DIAGNOSIS — E7801 Familial hypercholesterolemia: Secondary | ICD-10-CM

## 2017-08-08 DIAGNOSIS — E559 Vitamin D deficiency, unspecified: Secondary | ICD-10-CM

## 2017-08-08 DIAGNOSIS — E119 Type 2 diabetes mellitus without complications: Secondary | ICD-10-CM

## 2017-08-08 DIAGNOSIS — E785 Hyperlipidemia, unspecified: Secondary | ICD-10-CM

## 2017-08-08 DIAGNOSIS — Z9049 Acquired absence of other specified parts of digestive tract: Secondary | ICD-10-CM

## 2017-08-08 DIAGNOSIS — Z8249 Family history of ischemic heart disease and other diseases of the circulatory system: Secondary | ICD-10-CM | POA: Insufficient documentation

## 2017-08-08 DIAGNOSIS — G4730 Sleep apnea, unspecified: Secondary | ICD-10-CM

## 2017-08-08 DIAGNOSIS — R0789 Other chest pain: Secondary | ICD-10-CM

## 2017-08-08 DIAGNOSIS — R079 Chest pain, unspecified: Secondary | ICD-10-CM | POA: Diagnosis not present

## 2017-08-08 LAB — BASIC METABOLIC PANEL
ANION GAP: 10 (ref 5–15)
BUN: 20 mg/dL (ref 6–20)
CALCIUM: 9 mg/dL (ref 8.9–10.3)
CO2: 26 mmol/L (ref 22–32)
Chloride: 101 mmol/L (ref 101–111)
Creatinine, Ser: 1.23 mg/dL — ABNORMAL HIGH (ref 0.44–1.00)
GFR calc Af Amer: 57 mL/min — ABNORMAL LOW (ref >60)
GFR, EST NON AFRICAN AMERICAN: 50 mL/min — AB (ref >60)
GLUCOSE: 111 mg/dL — AB (ref 65–99)
Potassium: 4.3 mmol/L (ref 3.5–5.1)
SODIUM: 137 mmol/L (ref 135–145)

## 2017-08-08 LAB — CBC
HCT: 42.9 % (ref 35.0–47.0)
Hemoglobin: 14.4 g/dL (ref 12.0–16.0)
MCH: 30.6 pg (ref 26.0–34.0)
MCHC: 33.7 g/dL (ref 32.0–36.0)
MCV: 91 fL (ref 80.0–100.0)
Platelets: 304 10*3/uL (ref 150–440)
RBC: 4.71 MIL/uL (ref 3.80–5.20)
RDW: 13 % (ref 11.5–14.5)
WBC: 8.8 10*3/uL (ref 3.6–11.0)

## 2017-08-08 LAB — TROPONIN I

## 2017-08-08 LAB — FIBRIN DERIVATIVES D-DIMER (ARMC ONLY): FIBRIN DERIVATIVES D-DIMER (ARMC): 282.25 ng{FEU}/mL (ref 0.00–499.00)

## 2017-08-08 MED ORDER — IOPAMIDOL (ISOVUE-370) INJECTION 76%
75.0000 mL | Freq: Once | INTRAVENOUS | Status: AC | PRN
  Administered 2017-08-08: 75 mL via INTRAVENOUS

## 2017-08-08 MED ORDER — SODIUM CHLORIDE 0.9 % IV BOLUS (SEPSIS)
500.0000 mL | Freq: Once | INTRAVENOUS | Status: AC
  Administered 2017-08-08: 500 mL via INTRAVENOUS

## 2017-08-08 MED ORDER — NITROGLYCERIN 0.4 MG SL SUBL: 0.4000 mg | SUBLINGUAL_TABLET | SUBLINGUAL | Status: DC | PRN

## 2017-08-08 MED ORDER — NITROGLYCERIN 0.4 MG SL SUBL
0.4000 mg | SUBLINGUAL_TABLET | Freq: Once | SUBLINGUAL | Status: AC
Start: 2017-08-08 — End: 2017-08-08
  Administered 2017-08-08: 0.4 mg via SUBLINGUAL

## 2017-08-08 MED ORDER — FENTANYL CITRATE (PF) 100 MCG/2ML IJ SOLN
50.0000 ug | INTRAMUSCULAR | Status: DC | PRN
  Administered 2017-08-08: 50 ug via INTRAVENOUS
  Filled 2017-08-08: qty 2

## 2017-08-08 MED ORDER — ASPIRIN 81 MG PO CHEW
324.0000 mg | CHEWABLE_TABLET | Freq: Once | ORAL | Status: AC
  Administered 2017-08-08: 324 mg via ORAL

## 2017-08-08 NOTE — ED Provider Notes (Signed)
[facility]    CSN: [id number] Arrival date & time: 08/08/17  1252  History   Chief Complaint Chief Complaint  Patient presents with  . Chest Pain   HPI  51 year old female with type 2 diabetes, hyperlipidemia, morbid obesity presents with chest pain.  Patient reports that she developed sudden onset chest pain at approximately 11:15 this morning. She states that she's never had anything like this before. Located in the middle of the chest. Described as pinching/squeezing. Radiates to the back. 3-4/10 in severity. She was at her desk working from home when it happened. She states that she's been under some stress recently. No associated shortness of breath. No associated with exertion. No reports of diaphoresis, however she feels flushed. She does report that she just does not feel well. She reports fatigue. She continues to have pain currently. She endorses family history of cardiac disease. No known exacerbating or relieving factors. No other complaints this time.  Past Medical History:  Diagnosis Date  . Allergy   . Diabetes mellitus without complication ([place])   . Sleep apnea    cpap     Patient Active Problem List   Diagnosis Date Noted  . Colon polyp, hyperplastic 07/16/2017  . Type 2 diabetes mellitus without complication, without long-term current use of insulin ([place]) 08/18/2015  . 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 Environmental and seasonal allergies 07/08/2015  . Avitaminosis D 07/08/2015  . Beat, premature ventricular 02/11/2015  . Awareness of heartbeats 02/11/2015   Past Surgical History:  Procedure Laterality Date  . ABDOMINAL HYSTERECTOMY    . CHOLECYSTECTOMY    . COLONOSCOPY WITH PROPOFOL N/A 07/13/2017   Procedure: COLONOSCOPY WITH PROPOFOL;  Surgeon: Lollie Sails, MD;  Location: [facility] ENDOSCOPY;  Service: Endoscopy;  Laterality: N/A;  . NASAL SINUS SURGERY    . TOTAL VAGINAL HYSTERECTOMY    . TUBAL LIGATION        OB History    No data available     Home Medications    Prior to Admission medications   Medication Sig Start Date End Date Taking? Authorizing Provider  acyclovir ointment (ZOVIRAX) 5 % Apply 1 application topically every 3 (three) hours. 08/25/16  Yes Glean Hess, MD  Cholecalciferol (VITAMIN D3) 5000 units TABS Take 1 tablet by mouth daily.   Yes [provider]  fexofenadine-pseudoephedrine (ALLEGRA-D 24) 180-240 MG 24 hr tablet Take 1 tablet by mouth daily. 08/02/16  Yes Glean Hess, MD  meloxicam (MOBIC) 15 MG tablet Take 1 tablet (15 mg total) by mouth daily. 12/29/16  Yes Frederich Cha, MD  triamcinolone (NASACORT ALLERGY 24HR) 55 MCG/ACT AERO nasal inhaler Place 2 sprays into the nose daily.   Yes [provider]    Family History Family History  Problem Relation Age of Onset  . Breast cancer Paternal Grandmother   . Colon cancer Mother   . Hypertension Mother   . Diabetes Father   . Hypertension Father   . CAD Father   . Hypertension Sister     Social History Social History  Substance Use Topics  . Smoking status: Never Smoker  . Smokeless tobacco: Never Used  . Alcohol use No    Allergies   Povidone iodine; Cephalexin; and Peanut-containing drug products  Review of Systems Review of Systems  Constitutional: Positive for fatigue. Negative for diaphoresis.  Cardiovascular: Positive for chest pain.  Gastrointestinal: Negative for nausea and vomiting.  Skin:       Feels  flushed.   All other systems reviewed and are negative.  Physical Exam Triage Vital Signs ED Triage Vitals  Enc Vitals Group     BP 08/08/17 1301 (!) 159/70     Pulse Rate 08/08/17 1301 79     Resp 08/08/17 1301 18     Temp 08/08/17 1301 97.9 F (36.6 C)     Temp Source 08/08/17 1301 Oral     SpO2 08/08/17 1301 98 %     Weight 08/08/17 1300 293 lb (132.9 kg)     Height 08/08/17 1300 5\' 6"  (1.676 m)     Head Circumference --      Peak Flow --       Pain Score 08/08/17 1300 4     Pain Loc --      Pain Edu? --      Excl. in [place]? --     Updated Vital Signs BP (!) 159/73   Pulse 80   Temp 97.9 F (36.6 C) (Oral)   Resp 18   Ht 5\' 6"  (1.676 m)   Wt 293 lb (132.9 kg)   SpO2 98%   BMI 47.29 kg/m   Physical Exam  Constitutional: She is oriented to person, place, and time. She appears well-developed. No distress.  HENT:  Head: Normocephalic and atraumatic.  Mouth/Throat: Oropharynx is clear and moist.  Eyes: Conjunctivae are normal. No scleral icterus.  Neck: Neck supple.  Cardiovascular: Normal rate and regular rhythm.   No murmur heard. Pulmonary/Chest: Effort normal and breath sounds normal. She has no wheezes. She has no rales.  Abdominal: Soft. She exhibits no distension. There is no tenderness. There is no rebound and no guarding.  Lymphadenopathy:    She has no cervical adenopathy.  Neurological: She is alert and oriented to person, place, and time.  Skin: Skin is warm. No rash noted.  Psychiatric: She has a normal mood and affect.  Vitals reviewed.  UC Treatments / Results  Labs (all labs ordered are listed, but only abnormal results are displayed) Labs Reviewed - No data to display  ED ECG REPORT   Date: 08/08/2017  EKG Time: 1:47 PM  Rate: 71  Rhythm: normal sinus rhythm,    Axis: Normal.  Intervals:Prolonged QT  ST&T Change: Nonspecific T wave changes.  Narrative Interpretation: Normal sinus rhythm with a rate of 71. Prolonged QT. Nonspecific T-wave changes.  Radiology No results found.  Procedures Procedures (including critical care time)  Medications Ordered in UC Medications  aspirin chewable tablet 324 mg (324 mg Oral Given 08/08/17 1334)  nitroGLYCERIN (NITROSTAT) SL tablet 0.4 mg (0.4 mg Sublingual Given 08/08/17 1341)   Initial Impression / Assessment and Plan / UC Course  I have reviewed the triage vital signs and the nursing notes.  Pertinent labs & imaging results that were available  during my care of the patient were reviewed by me and considered in my medical decision making (see chart for details).     51 year old female with type 2 diabetes, morbid obesity, hyperlipidemia presents with atypical chest pain. Uncertain etiology/prognosis at this time. Patient given aspirin and nitroglycerin. Given Cardec risk factors and presentation, patient sent to the ER for further evaluation/labs.  Final Clinical Impressions(s) / UC Diagnoses   Final diagnoses:  Atypical chest pain   New Prescriptions New Prescriptions   No medications on file   Controlled Substance Prescriptions  Controlled Substance Registry consulted? Not Applicable   Coral Spikes, DO 08/08/17 1349

## 2017-08-08 NOTE — ED Triage Notes (Signed)
Patient presents to ED via [facility] from [facility] urgent care with c/o sub sternal chest pain. MUC gave 324 of asa and 1 sublingual nitro. Patient is pain free on arrival. Ambulatory to stretcher.

## 2017-08-08 NOTE — ED Triage Notes (Signed)
Patient complains of right sided chest pain, patient is unsure of radiation. Patient states that she took her BP at home and was 180/70. Patient reports that pain started approx 1 hour and 30 mins ago.

## 2017-08-08 NOTE — ED Notes (Signed)
Patient transported to CT 

## 2017-08-08 NOTE — ED Provider Notes (Signed)
[facility] Emergency Department Provider Note    None    (approximate)  I have reviewed the triage vital signs and the nursing notes.   HISTORY  Chief Complaint Chest Pain    HPI Mary Macias is a 51 y.o. female with a history of diabetes mellitus as well as remote history of PE no longer on anticoagulation presents with chief complaint of midsternal chest pain and pressure that started around 11:00 today.  Says it was associated with a flushed feeling.  States it was mild to moderate and pinching in severity.  Patient did get up from her desk and walked around and did not feel any significant improvement of the pain.  Denies any diaphoresis but does feel some pain radiating through to her back.  She does not smoke.  No history of high blood pressure.  States that she checked her blood pressure and it was elevated.  EMS was called and she was given aspirin and nitro with improvement in her symptoms.  Past Medical History:  Diagnosis Date  . Allergy   . Diabetes mellitus without complication ([place])   . Sleep apnea    cpap   Family History  Problem Relation Age of Onset  . Breast cancer Paternal Grandmother   . Colon cancer Mother   . Hypertension Mother   . Diabetes Father   . Hypertension Father   . CAD Father   . Hypertension Sister    Past Surgical History:  Procedure Laterality Date  . ABDOMINAL HYSTERECTOMY    . CHOLECYSTECTOMY    . COLONOSCOPY WITH PROPOFOL N/A 07/13/2017   Procedure: COLONOSCOPY WITH PROPOFOL;  Surgeon: Lollie Sails, MD;  Location: [facility] ENDOSCOPY;  Service: Endoscopy;  Laterality: N/A;  . NASAL SINUS SURGERY    . TOTAL VAGINAL HYSTERECTOMY    . TUBAL LIGATION     Patient Active Problem List   Diagnosis Date Noted  . Colon polyp, hyperplastic 07/16/2017  . Type 2 diabetes mellitus without complication, without long-term current use of insulin ([place]) 08/18/2015  . Combined fat and carbohydrate induced  hyperlipemia 07/08/2015  . Environmental and seasonal allergies 07/08/2015  . Avitaminosis D 07/08/2015  . Beat, premature ventricular 02/11/2015  . Awareness of heartbeats 02/11/2015      Prior to Admission medications   Medication Sig Start Date End Date Taking? Authorizing Provider  acyclovir ointment (ZOVIRAX) 5 % Apply 1 application topically every 3 (three) hours. 08/25/16   Glean Hess, MD  Cholecalciferol (VITAMIN D3) 5000 units TABS Take 1 tablet by mouth daily.    [provider]  fexofenadine-pseudoephedrine (ALLEGRA-D 24) 180-240 MG 24 hr tablet Take 1 tablet by mouth daily. 08/02/16   Glean Hess, MD  meloxicam (MOBIC) 15 MG tablet Take 1 tablet (15 mg total) by mouth daily. 12/29/16   Frederich Cha, MD  triamcinolone (NASACORT ALLERGY 24HR) 55 MCG/ACT AERO nasal inhaler Place 2 sprays into the nose daily.    [provider]    Allergies Povidone iodine; Cephalexin; and Peanut-containing drug products    Social History Social History  Substance Use Topics  . Smoking status: Never Smoker  . Smokeless tobacco: Never Used  . Alcohol use No    Review of Systems Patient denies headaches, rhinorrhea, blurry vision, numbness, shortness of breath, chest pain, edema, cough, abdominal pain, nausea, vomiting, diarrhea, dysuria, fevers, rashes or hallucinations unless otherwise stated above in HPI. ____________________________________________   PHYSICAL EXAM:  VITAL SIGNS: Vitals:   08/08/17  1515 08/08/17 1600  BP:  134/65  Pulse: 71 76  Resp: 17 16  Temp:    SpO2: 95% 94%    Constitutional: Alert and oriented. Well appearing and in no acute distress. Eyes: Conjunctivae are normal.  Head: Atraumatic. Nose: No congestion/rhinnorhea. Mouth/Throat: Mucous membranes are moist.   Neck: No stridor. Painless ROM.  Cardiovascular: Normal rate, regular rhythm. Grossly normal heart sounds.  Good peripheral circulation. Respiratory: Normal  respiratory effort.  No retractions. Lungs CTAB. Gastrointestinal: Soft and nontender. No distention. No abdominal bruits. No CVA tenderness. Genitourinary:  Musculoskeletal: No lower extremity tenderness nor edema.  No joint effusions. Neurologic:  Normal speech and language. No gross focal neurologic deficits are appreciated. No facial droop Skin:  Skin is warm, dry and intact. No rash noted. Psychiatric: Mood and affect are normal. Speech and behavior are normal.  ____________________________________________   LABS (all labs ordered are listed, but only abnormal results are displayed)  Results for orders placed or performed during the hospital encounter of 08/08/17 (from the past 24 hour(s))  Basic metabolic panel     Status: Abnormal   Collection Time: 08/08/17  2:05 PM  Result Value Ref Range   Sodium 137 135 - 145 mmol/L   Potassium 4.3 3.5 - 5.1 mmol/L   Chloride 101 101 - 111 mmol/L   CO2 26 22 - 32 mmol/L   Glucose, Bld 111 (H) 65 - 99 mg/dL   BUN 20 6 - 20 mg/dL   Creatinine, Ser 1.23 (H) 0.44 - 1.00 mg/dL   Calcium 9.0 8.9 - 10.3 mg/dL   GFR calc non Af Amer 50 (L) >60 mL/min   GFR calc Af Amer 57 (L) >60 mL/min   Anion gap 10 5 - 15  CBC     Status: None   Collection Time: 08/08/17  2:05 PM  Result Value Ref Range   WBC 8.8 3.6 - 11.0 K/uL   RBC 4.71 3.80 - 5.20 MIL/uL   Hemoglobin 14.4 12.0 - 16.0 g/dL   HCT 42.9 35.0 - 47.0 %   MCV 91.0 80.0 - 100.0 fL   MCH 30.6 26.0 - 34.0 pg   MCHC 33.7 32.0 - 36.0 g/dL   RDW 13.0 11.5 - 14.5 %   Platelets 304 150 - 440 K/uL  Troponin I     Status: None   Collection Time: 08/08/17  2:05 PM  Result Value Ref Range   Troponin I <0.03 <0.03 ng/mL  Fibrin derivatives D-Dimer ([facility] only)     Status: None   Collection Time: 08/08/17  3:30 PM  Result Value Ref Range   Fibrin derivatives D-dimer ([facility]) 282.25 0.00 - 499.00 ng/mL (FEU)  Troponin I     Status: None   Collection Time: 08/08/17  5:38 PM  Result Value Ref Range     Troponin I <0.03 <0.03 ng/mL   ____________________________________________  EKG My review and personal interpretation at Time: 13:06   Indication: chest pain  Rate: 70  Rhythm: sinus Axis: normal  Other: borderline prolonged qt, no stemi, no wpw or brugada ____________________________________________  RADIOLOGY  I personally reviewed all radiographic images ordered to evaluate for the above acute complaints and reviewed radiology reports and findings.  These findings were personally discussed with the patient.  Please see medical record for radiology report.  ____________________________________________   PROCEDURES  Procedure(s) performed:  Procedures    Critical Care performed: no ____________________________________________   INITIAL IMPRESSION / ASSESSMENT AND PLAN / ED COURSE  Pertinent labs &  imaging results that were available during my care of the patient were reviewed by me and considered in my medical decision making (see chart for details).  DDX: ACS, pericarditis, esophagitis, boerhaaves, pe, dissection, pna, bronchitis, costochondritis   Mary Macias is a 50 y.o. who presents to the ED with chest pain as described above.  She is currently well-appearing and in no acute distress.  Differential included the above diagnoses.  Blood work fortunately is is fairly reassuring at this time.  Mild CKD as well as normal troponin.  No anemia.  EKG without any evidence of acute ischemia.  She is low risk by heart score of 3 versus 4 based on subjectivity and therefore will observe the patient in the ER for repeat troponin to further risk stratify.  Patient will be kept on a telemetry monitor.  She will be provided IV pain medication as needed.  We will also further stratify for PE versus dissection with d-dimer.  The patient will be placed on continuous pulse oximetry and telemetry for monitoring.  Laboratory evaluation will be sent to evaluate for the above  complaints.     ----------------------------------------- 3:20 PM on 08/08/2017 -----------------------------------------   OBSERVATION CARE: This patient is being placed under observation care for the following reasons: Chest pain with repeat testing to rule out ischemia      Clinical Course as of Aug 08 1830  Wed Aug 08, 2017  1629 D-dimer is normal.  We will proceed with CT angiogram to evaluate for aortic disease.  [PR]  1717 CT angiogram is unrevealing, fortunately.  Patient is chest pain-free.  Remains him dynamically stable.  Currently awaiting repeat troponin and EKG.  [PR]    Clinical Course User Index [PR] Merlyn Lot, MD    ----------------------------------------- 6:31 PM on 08/08/2017 -----------------------------------------  ----------------------------------------- 6:31 PM on 08/08/2017 -----------------------------------------   END OF OBSERVATION STATUS: After an appropriate period of observation, this patient is being discharged due to the following reason(s): Repeat troponin negative.  Remains minimally stable and chest pain-free.  Patient will follow-up with Dr. Nehemiah Massed in the morning.  I spoke with him regarding patient's presentation and agrees for close follow-up for further risk stratification evaluation.  Have discussed with the patient and available family all diagnostics and treatments performed thus far and all questions were answered to the best of my ability. The patient demonstrates understanding and agreement with plan.   ____________________________________________   FINAL CLINICAL IMPRESSION(S) / ED DIAGNOSES  Final diagnoses:  Chest pain, unspecified type      NEW MEDICATIONS STARTED DURING THIS VISIT:  New Prescriptions   No medications on file     Note:  This document was prepared using Dragon voice recognition software and may include unintentional dictation errors.    Merlyn Lot, MD 08/08/17 [phone]

## 2017-08-10 DIAGNOSIS — I2089 Other forms of angina pectoris: Secondary | ICD-10-CM | POA: Insufficient documentation

## 2017-08-10 DIAGNOSIS — I208 Other forms of angina pectoris: Secondary | ICD-10-CM

## 2017-08-10 HISTORY — DX: Other forms of angina pectoris: I20.8

## 2017-08-10 HISTORY — DX: Other forms of angina pectoris: I20.89

## 2017-09-03 ENCOUNTER — Other Ambulatory Visit: Payer: Self-pay | Admitting: Internal Medicine

## 2017-09-03 DIAGNOSIS — Z1231 Encounter for screening mammogram for malignant neoplasm of breast: Secondary | ICD-10-CM

## 2017-09-20 ENCOUNTER — Ambulatory Visit
Admission: RE | Admit: 2017-09-20 | Discharge: 2017-09-20 | Disposition: A | Discharge status: Home / Self Care | Payer: 59 | Source: Ambulatory Visit | Attending: Internal Medicine | Admitting: Internal Medicine

## 2017-09-20 DIAGNOSIS — Z1231 Encounter for screening mammogram for malignant neoplasm of breast: Secondary | ICD-10-CM | POA: Diagnosis present

## 2017-10-26 ENCOUNTER — Other Ambulatory Visit: Payer: Self-pay | Admitting: Internal Medicine

## 2017-10-26 ENCOUNTER — Ambulatory Visit (INDEPENDENT_AMBULATORY_CARE_PROVIDER_SITE_OTHER): Payer: 59 | Admitting: Internal Medicine

## 2017-10-26 ENCOUNTER — Encounter: Payer: Self-pay | Admitting: Internal Medicine

## 2017-10-26 VITALS — BP 116/74 | HR 55 | Ht 65.0 in | Wt 302.0 lb

## 2017-10-26 DIAGNOSIS — Z1159 Encounter for screening for other viral diseases: Secondary | ICD-10-CM | POA: Diagnosis not present

## 2017-10-26 DIAGNOSIS — I208 Other forms of angina pectoris: Secondary | ICD-10-CM

## 2017-10-26 DIAGNOSIS — E785 Hyperlipidemia, unspecified: Secondary | ICD-10-CM | POA: Diagnosis not present

## 2017-10-26 DIAGNOSIS — E1169 Type 2 diabetes mellitus with other specified complication: Secondary | ICD-10-CM

## 2017-10-26 DIAGNOSIS — E119 Type 2 diabetes mellitus without complications: Secondary | ICD-10-CM

## 2017-10-26 NOTE — Patient Instructions (Signed)
Schedule annual Diabetic eye exam.

## 2017-10-26 NOTE — Progress Notes (Signed)
Date:  10/26/2017   Name:  Mary Macias   DOB:  11/04/1965   MRN:  [id number]   Chief Complaint: Diabetes (Quit taking metformin in March 2018. ) Diabetes  She presents for her follow-up diabetic visit. She has type 2 diabetes mellitus. Her disease course has been stable. Pertinent negatives for hypoglycemia include no headaches or tremors. Pertinent negatives for diabetes include no chest pain, no fatigue, no polydipsia and no polyuria. Diabetic complications include nephropathy. Risk factors for coronary artery disease include diabetes mellitus. When asked about current treatments, none were reported.   Lab Results  Component Value Date   HGBA1C 7.0 (H) 07/17/2016   She also had decreased GFR to 50 on recent ED evaluation for chest pain.  Follow up has not be rechecked.  Did have stress ECHO that was normal.  No statin or aspirin therapy was recommended.  It was felt that the angina came from electrolyte disturbance from Keto diet. Chest pain - has not been recurrent.  Aspirin is not being taken.  Lipids - last lipids were average but LDL > 100.  She would consider taking medication.  Review of Systems  Constitutional: Negative for appetite change, fatigue, fever and unexpected weight change.  HENT: Negative for tinnitus and trouble swallowing.   Eyes: Negative for visual disturbance.  Respiratory: Negative for cough, chest tightness and shortness of breath.   Cardiovascular: Negative for chest pain, palpitations and leg swelling.  Gastrointestinal: Negative for abdominal pain.  Endocrine: Negative for polydipsia and polyuria.  Genitourinary: Negative for dysuria and hematuria.  Musculoskeletal: Negative for arthralgias.  Neurological: Negative for tremors, numbness and headaches.  Psychiatric/Behavioral: Negative for dysphoric mood.    Patient Active Problem List   Diagnosis Date Noted  . Stable angina pectoris ([place]) 08/10/2017  . Colon polyp, hyperplastic 07/16/2017    . Type 2 diabetes mellitus without complication, without long-term current use of insulin ([place]) 08/18/2015  . 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 Environmental and seasonal allergies 07/08/2015  . Avitaminosis D 07/08/2015  . Beat, premature ventricular 02/11/2015  . Awareness of heartbeats 02/11/2015    Prior to Admission medications   Medication Sig Start Date End Date Taking? Authorizing Provider  acetaminophen (TYLENOL) 650 MG CR tablet Take 650 mg by mouth every 8 (eight) hours as needed for pain.   Yes [provider]  acyclovir ointment (ZOVIRAX) 5 % Apply 1 application topically every 3 (three) hours. 08/25/16  Yes Mary Hess, MD  Cholecalciferol (VITAMIN D3) 5000 units TABS Take 1 tablet by mouth daily.   Yes [provider]  fexofenadine-pseudoephedrine (ALLEGRA-D 24) 180-240 MG 24 hr tablet Take 1 tablet by mouth daily. 08/02/16  Yes Mary Hess, MD  Misc Natural Products (OSTEO BI-FLEX/5-LOXIN ADVANCED PO) Take by mouth.   Yes [provider]  Probiotic Product (PROBIOTIC-10 PO) Take by mouth.   Yes [provider]  triamcinolone (NASACORT ALLERGY 24HR) 55 MCG/ACT AERO nasal inhaler Place 2 sprays into the nose daily.   Yes [provider]  valACYclovir (VALTREX) 1000 MG tablet Take by mouth. 08/25/16  Yes [provider]    Allergies  Allergen Reactions  . Povidone Iodine Rash    Other Reaction: Other reaction  . Cephalexin Rash  . Peanut-Containing Drug Products Rash    Past Surgical History:  Procedure Laterality Date  . ABDOMINAL HYSTERECTOMY    . CHOLECYSTECTOMY    . COLONOSCOPY WITH PROPOFOL N/A 07/13/2017   Procedure:  COLONOSCOPY WITH PROPOFOL;  Surgeon: Lollie Sails, MD;  Location: [facility] ENDOSCOPY;  Service: Endoscopy;  Laterality: N/A;  . NASAL SINUS SURGERY    . TOTAL VAGINAL HYSTERECTOMY    . TUBAL LIGATION      Social History   Tobacco Use  . Smoking  status: Never Smoker  . Smokeless tobacco: Never Used  Substance Use Topics  . Alcohol use: No    Alcohol/week: 0.0 oz  . Drug use: No     Medication list has been reviewed and updated.  PHQ 2/9 Scores 07/26/2017 12/03/2015  PHQ - 2 Score 0 0    Physical Exam  Constitutional: She is oriented to person, place, and time. She appears well-developed. No distress.  HENT:  Head: Normocephalic and atraumatic.  Cardiovascular: Normal rate, regular rhythm and normal heart sounds.  Pulmonary/Chest: Effort normal and breath sounds normal. No respiratory distress. She has no wheezes.  Musculoskeletal: Normal range of motion. She exhibits no edema.  Neurological: She is alert and oriented to person, place, and time.  Skin: Skin is warm and dry. No rash noted.  Psychiatric: She has a normal mood and affect. Her behavior is normal. Thought content normal.  Nursing note and vitals reviewed.   BP 116/74 (BP Location: Right Arm, Patient Position: Sitting, Cuff Size: Large)   Pulse (!) 55   Ht 5\' 5"  (1.651 m)   Wt (!) 302 lb (137 kg)   SpO2 99%   BMI 50.26 kg/m   Assessment and Plan: 1. Type 2 diabetes mellitus without complication, without long-term current use of insulin ([place]) May need to resume oral agents - will wait for labs - Comprehensive metabolic panel - Hemoglobin A1c  2. Stable angina pectoris [facility]) Not recurrent Check labs  3. Hyperlipidemia associated with type 2 diabetes mellitus ([place]) Will likely need statin therapy - would start at next visit  4. Need for hepatitis C screening test - Hepatitis C antibody   No orders of the defined types were placed in this encounter.   Partially dictated using Editor, commissioning. Any errors are unintentional.  Halina Maidens, MD [facility]  10/26/2017

## 2017-10-27 LAB — COMPREHENSIVE METABOLIC PANEL
A/G RATIO: 1.7 (ref 1.2–2.2)
ALK PHOS: 67 IU/L (ref 39–117)
ALT: 32 IU/L (ref 0–32)
AST: 24 IU/L (ref 0–40)
Albumin: 4.6 g/dL (ref 3.5–5.5)
BILIRUBIN TOTAL: 0.6 mg/dL (ref 0.0–1.2)
BUN/Creatinine Ratio: 15 (ref 9–23)
BUN: 14 mg/dL (ref 6–24)
CO2: 23 mmol/L (ref 20–29)
Calcium: 9.1 mg/dL (ref 8.7–10.2)
Chloride: 99 mmol/L (ref 96–106)
Creatinine, Ser: 0.92 mg/dL (ref 0.57–1.00)
GFR calc Af Amer: 82 mL/min/{1.73_m2} (ref >59)
GFR, EST NON AFRICAN AMERICAN: 71 mL/min/{1.73_m2} (ref >59)
GLOBULIN, TOTAL: 2.7 g/dL (ref 1.5–4.5)
Glucose: 101 mg/dL — ABNORMAL HIGH (ref 65–99)
Potassium: 4.7 mmol/L (ref 3.5–5.2)
SODIUM: 140 mmol/L (ref 134–144)
Total Protein: 7.3 g/dL (ref 6.0–8.5)

## 2017-10-27 LAB — HEPATITIS C ANTIBODY: Hep C Virus Ab: <0.1 s/co ratio (ref 0.0–0.9)

## 2017-10-27 LAB — HEMOGLOBIN A1C
ESTIMATED AVERAGE GLUCOSE: 137 mg/dL
HEMOGLOBIN A1C: 6.4 % — AB (ref 4.8–5.6)

## 2017-10-28 ENCOUNTER — Other Ambulatory Visit: Payer: Self-pay | Admitting: Internal Medicine

## 2017-10-28 DIAGNOSIS — E785 Hyperlipidemia, unspecified: Principal | ICD-10-CM

## 2017-10-28 DIAGNOSIS — E119 Type 2 diabetes mellitus without complications: Secondary | ICD-10-CM

## 2017-10-28 DIAGNOSIS — E1169 Type 2 diabetes mellitus with other specified complication: Secondary | ICD-10-CM

## 2017-10-28 MED ORDER — ATORVASTATIN CALCIUM 10 MG PO TABS: 10.0000 mg | ORAL_TABLET | Freq: Every day | ORAL | 3 refills | Status: DC

## 2017-12-25 ENCOUNTER — Ambulatory Visit (INDEPENDENT_AMBULATORY_CARE_PROVIDER_SITE_OTHER): Payer: 59 | Admitting: Family Medicine

## 2017-12-25 ENCOUNTER — Encounter: Payer: Self-pay | Admitting: Family Medicine

## 2017-12-25 VITALS — BP 120/70 | HR 80 | Temp 98.0°F | Ht 65.0 in | Wt 313.0 lb

## 2017-12-25 DIAGNOSIS — L0100 Impetigo, unspecified: Secondary | ICD-10-CM

## 2017-12-25 DIAGNOSIS — J0130 Acute sphenoidal sinusitis, unspecified: Secondary | ICD-10-CM | POA: Diagnosis not present

## 2017-12-25 MED ORDER — AMOXICILLIN-POT CLAVULANATE 875-125 MG PO TABS
1.0000 | ORAL_TABLET | Freq: Two times a day (BID) | ORAL | 0 refills | Status: DC
Start: 2017-12-25 — End: 2018-01-25

## 2017-12-25 MED ORDER — MUPIROCIN 2 % EX OINT: 1.0000 "application " | TOPICAL_OINTMENT | Freq: Two times a day (BID) | CUTANEOUS | 0 refills | Status: DC

## 2017-12-25 NOTE — Progress Notes (Signed)
Name: Destenee Guerry   MRN: [id number]    DOB: 03-11-1966   Date:12/25/2017       Progress Note  Subjective  Chief Complaint  Chief Complaint  Patient presents with  . Sinusitis    been sick x 2 weeks, cough and cong, green production, neck feels stiff    Sinusitis  This is a new problem. The current episode started 1 to 4 weeks ago. The problem has been waxing and waning since onset. Maximum temperature: low grade. Her pain is at a severity of 5/10. She is experiencing no pain. Associated symptoms include congestion, coughing, ear pain, neck pain and sinus pressure. Pertinent negatives include no chills, diaphoresis, headaches, hoarse voice, shortness of breath, sneezing, sore throat or swollen glands. Past treatments include acetaminophen and oral decongestants. The treatment provided mild relief.    No problem-specific Assessment & Plan notes found for this encounter.   Past Medical History:  Diagnosis Date  . Allergy   . Diabetes mellitus without complication ([place])   . Sleep apnea    cpap    Past Surgical History:  Procedure Laterality Date  . ABDOMINAL HYSTERECTOMY    . CHOLECYSTECTOMY    . COLONOSCOPY WITH PROPOFOL N/A 07/13/2017   Procedure: COLONOSCOPY WITH PROPOFOL;  Surgeon: Lollie Sails, MD;  Location: [facility] ENDOSCOPY;  Service: Endoscopy;  Laterality: N/A;  . NASAL SINUS SURGERY    . TOTAL VAGINAL HYSTERECTOMY    . TUBAL LIGATION      Family History  Problem Relation Age of Onset  . Breast cancer Paternal Grandmother   . Colon cancer Mother   . Hypertension Mother   . Diabetes Father   . Hypertension Father   . CAD Father   . Hypertension Sister     Social History   Socioeconomic History  . Marital status: Single    Spouse name: Not on file  . Number of children: Not on file  . Years of education: Not on file  . Highest education level: Not on file  Social Needs  . Financial resource strain: Not on file  . Food insecurity - worry: Not on  file  . Food insecurity - inability: Not on file  . Transportation needs - medical: Not on file  . Transportation needs - non-medical: Not on file  Occupational History  . Not on file  Tobacco Use  . Smoking status: Never Smoker  . Smokeless tobacco: Never Used  Substance and Sexual Activity  . Alcohol use: No    Alcohol/week: 0.0 oz  . Drug use: No  . Sexual activity: Not on file  Other Topics Concern  . Not on file  Social History Narrative  . Not on file    Allergies  Allergen Reactions  . Povidone Iodine Rash    Other Reaction: Other reaction  . Cephalexin Rash  . Metformin And Related Diarrhea  . Peanut-Containing Drug Products Rash    Outpatient Medications Prior to Visit  Medication Sig Dispense Refill  . acetaminophen (TYLENOL) 650 MG CR tablet Take 650 mg by mouth every 8 (eight) hours as needed for pain.    Marland Kitchen acyclovir ointment (ZOVIRAX) 5 % Apply 1 application topically every 3 (three) hours. 30 g 2  . atorvastatin (LIPITOR) 10 MG tablet Take 1 tablet (10 mg total) by mouth daily. 90 tablet 3  . Cholecalciferol (VITAMIN D3) 5000 units TABS Take 1 tablet by mouth daily.    . fexofenadine-pseudoephedrine (ALLEGRA-D 24) 180-240 MG 24 hr tablet Take  1 tablet by mouth daily. 90 tablet 3  . Misc Natural Products (OSTEO BI-FLEX/5-LOXIN ADVANCED PO) Take by mouth.    . Probiotic Product (PROBIOTIC-10 PO) Take by mouth.    . triamcinolone (NASACORT ALLERGY 24HR) 55 MCG/ACT AERO nasal inhaler Place 2 sprays into the nose daily.    . valACYclovir (VALTREX) 1000 MG tablet Take by mouth.     No facility-administered medications prior to visit.     Review of Systems  Constitutional: Negative for chills, diaphoresis, fever, malaise/fatigue and weight loss.  HENT: Positive for congestion, ear pain and sinus pressure. Negative for ear discharge, hoarse voice, sneezing and sore throat.   Eyes: Negative for blurred vision.  Respiratory: Positive for cough. Negative for  hemoptysis, sputum production, shortness of breath and wheezing.   Cardiovascular: Negative for chest pain, palpitations and leg swelling.  Gastrointestinal: Negative for abdominal pain, blood in stool, constipation, diarrhea, heartburn, melena and nausea.  Genitourinary: Negative for dysuria, frequency, hematuria and urgency.  Musculoskeletal: Positive for neck pain. Negative for back pain, joint pain and myalgias.  Skin: Positive for rash.       Began as herpetic vesicle and developed secondary infection.  Neurological: Negative for dizziness, tingling, sensory change, focal weakness and headaches.  Endo/Heme/Allergies: Negative for environmental allergies and polydipsia. Does not bruise/bleed easily.  Psychiatric/Behavioral: Negative for depression and suicidal ideas. The patient is not nervous/anxious and does not have insomnia.      Objective  Vitals:   12/25/17 1051  BP: 120/70  Pulse: 80  Temp: 98 F (36.7 C)  TempSrc: Oral  Weight: (!) 313 lb (142 kg)  Height: 5\' 5"  (1.651 m)    Physical Exam  Constitutional: She is well-developed, well-nourished, and in no distress. No distress.  HENT:  Head: Normocephalic and atraumatic.  Right Ear: External ear normal.  Left Ear: External ear normal.  Nose: Nose normal.  Mouth/Throat: Oropharynx is clear and moist.  Eyes: Conjunctivae and EOM are normal. Pupils are equal, round, and reactive to light. Right eye exhibits no discharge. Left eye exhibits no discharge.  Neck: Normal range of motion. Neck supple. No JVD present. No thyromegaly present.  Cardiovascular: Normal rate, regular rhythm, normal heart sounds and intact distal pulses. Exam reveals no gallop and no friction rub.  No murmur heard. Pulmonary/Chest: Effort normal and breath sounds normal. She has no wheezes. She has no rales.  Abdominal: Soft. Bowel sounds are normal. She exhibits no mass. There is no tenderness. There is no guarding.  Musculoskeletal: Normal range  of motion. She exhibits no edema.  Lymphadenopathy:    She has no cervical adenopathy.  Neurological: She is alert. She has normal reflexes.  Skin: Skin is warm and dry. She is not diaphoretic.  Psychiatric: Mood and affect normal.  Nursing note and vitals reviewed.     Assessment & Plan  Problem List Items Addressed This Visit    None    Visit Diagnoses    Acute sphenoidal sinusitis, recurrence not specified    -  Primary   Relevant Medications   amoxicillin-clavulanate (AUGMENTIN) 875-125 MG tablet   Impetigo       Relevant Medications   mupirocin ointment (BACTROBAN) 2 %      Meds ordered this encounter  Medications  . amoxicillin-clavulanate (AUGMENTIN) 875-125 MG tablet    Sig: Take 1 tablet by mouth 2 (two) times daily.    Dispense:  20 tablet    Refill:  0  . mupirocin ointment (BACTROBAN) 2 %  Sig: Place 1 application into the nose 2 (two) times daily.    Dispense:  22 g    Refill:  0      Dr. Macon [facility]  12/25/17

## 2018-01-25 ENCOUNTER — Encounter: Payer: Self-pay | Admitting: Internal Medicine

## 2018-01-25 ENCOUNTER — Ambulatory Visit (INDEPENDENT_AMBULATORY_CARE_PROVIDER_SITE_OTHER): Payer: 59 | Admitting: Internal Medicine

## 2018-01-25 ENCOUNTER — Other Ambulatory Visit: Payer: Self-pay | Admitting: Internal Medicine

## 2018-01-25 VITALS — BP 116/76 | HR 65 | Ht 65.0 in | Wt 314.0 lb

## 2018-01-25 DIAGNOSIS — E785 Hyperlipidemia, unspecified: Secondary | ICD-10-CM | POA: Diagnosis not present

## 2018-01-25 DIAGNOSIS — Z6841 Body Mass Index (BMI) 40.0 and over, adult: Secondary | ICD-10-CM | POA: Insufficient documentation

## 2018-01-25 DIAGNOSIS — M170 Bilateral primary osteoarthritis of knee: Secondary | ICD-10-CM

## 2018-01-25 DIAGNOSIS — Z23 Encounter for immunization: Secondary | ICD-10-CM

## 2018-01-25 DIAGNOSIS — E119 Type 2 diabetes mellitus without complications: Secondary | ICD-10-CM | POA: Diagnosis not present

## 2018-01-25 DIAGNOSIS — E1169 Type 2 diabetes mellitus with other specified complication: Secondary | ICD-10-CM | POA: Diagnosis not present

## 2018-01-25 MED ORDER — ZOSTER VAC RECOMB ADJUVANTED 50 MCG/0.5ML IM SUSR: 0.5000 mL | Freq: Once | INTRAMUSCULAR | 1 refills | Status: AC

## 2018-01-25 MED ORDER — MELOXICAM 15 MG PO TABS: 15.0000 mg | ORAL_TABLET | Freq: Every day | ORAL | 5 refills | Status: DC

## 2018-01-25 NOTE — Patient Instructions (Addendum)
Please schedule Diabetic Eye Exam.  Pneumococcal Polysaccharide Vaccine: What You Need to Know 1. Why get vaccinated? Vaccination can protect older adults (and some children and younger adults) from pneumococcal disease. Pneumococcal disease is caused by bacteria that can spread from person to person through close contact. It can cause ear infections, and it can also lead to more serious infections of the:  Lungs (pneumonia),  Blood (bacteremia), and  Covering of the brain and spinal cord (meningitis). Meningitis can cause deafness and brain damage, and it can be fatal.  Anyone can get pneumococcal disease, but children under 83 years of age, people with certain medical conditions, adults over 81 years of age, and cigarette smokers are at the highest risk. About 18,000 older adults die each year from pneumococcal disease in the [place]. Treatment of pneumococcal infections with penicillin and other drugs used to be more effective. But some strains of the disease have become resistant to these drugs. This makes prevention of the disease, through vaccination, even more important. 2. Pneumococcal polysaccharide vaccine (PPSV23) Pneumococcal polysaccharide vaccine (PPSV23) protects against 23 types of pneumococcal bacteria. It will not prevent all pneumococcal disease. PPSV23 is recommended for:  All adults 30 years of age and older,  Anyone 2 through 54 years of age with certain long-term health problems,  Anyone 2 through 54 years of age with a weakened immune system,  Adults 63 through 54 years of age who smoke cigarettes or have asthma.  Most people need only one dose of PPSV. A second dose is recommended for certain high-risk groups. People 83 and older should get a dose even if they have gotten one or more doses of the vaccine before they turned 65. Your healthcare provider can give you more information about these recommendations. Most healthy adults develop protection within 2  to 3 weeks of getting the shot. 3. Some people should not get this vaccine  Anyone who has had a life-threatening allergic reaction to PPSV should not get another dose.  Anyone who has a severe allergy to any component of PPSV should not receive it. Tell your provider if you have any severe allergies.  Anyone who is moderately or severely ill when the shot is scheduled may be asked to wait until they recover before getting the vaccine. Someone with a mild illness can usually be vaccinated.  Children less than 22 years of age should not receive this vaccine.  There is no evidence that PPSV is harmful to either a pregnant woman or to her fetus. However, as a precaution, women who need the vaccine should be vaccinated before becoming pregnant, if possible. 4. Risks of a vaccine reaction With any medicine, including vaccines, there is a chance of side effects. These are usually mild and go away on their own, but serious reactions are also possible. About half of people who get PPSV have mild side effects, such as redness or pain where the shot is given, which go away within about two days. Less than 1 out of 100 people develop a fever, muscle aches, or more severe local reactions. Problems that could happen after any vaccine:  People sometimes faint after a medical procedure, including vaccination. Sitting or lying down for about 15 minutes can help prevent fainting, and injuries caused by a fall. Tell your doctor if you feel dizzy, or have vision changes or ringing in the ears.  Some people get severe pain in the shoulder and have difficulty moving the arm where a shot was given.  This happens very rarely.  Any medication can cause a severe allergic reaction. Such reactions from a vaccine are very rare, estimated at about 1 in a million doses, and would happen within a few minutes to a few hours after the vaccination. As with any medicine, there is a very remote chance of a vaccine causing a  serious injury or death. The safety of vaccines is always being monitored. For more information, visit: http://www.aguilar.org/ 5. What if there is a serious reaction? What should I look for? Look for anything that concerns you, such as signs of a severe allergic reaction, very high fever, or unusual behavior. Signs of a severe allergic reaction can include hives, swelling of the face and throat, difficulty breathing, a fast heartbeat, dizziness, and weakness. These would usually start a few minutes to a few hours after the vaccination. What should I do? If you think it is a severe allergic reaction or other emergency that can't wait, call 9-1-1 or get to the nearest hospital. Otherwise, call your doctor. Afterward, the reaction should be reported to the Vaccine Adverse Event Reporting System (VAERS). Your doctor might file this report, or you can do it yourself through the VAERS web site at www.vaers.SamedayNews.es, or by calling 867-440-5062. VAERS does not give medical advice. 6. How can I learn more?  Ask your doctor. He or she can give you the vaccine package insert or suggest other sources of information.  Call your local or state health department.  Contact the Centers for Disease Control and Prevention (CDC): ? Call (351)014-9542 (1-800-CDC-INFO) or ? Visit CDC's website at http://hunter.com/ CDC Pneumococcal Polysaccharide Vaccine VIS (01/30/14) This information is not intended to replace advice given to you by your health care provider. Make sure you discuss any questions you have with your health care provider. Document Released: 07/23/2006 Document Revised: 06/15/2016 Document Reviewed: 06/15/2016 Elsevier Interactive Patient Education  2017 [employer].

## 2018-01-25 NOTE — Progress Notes (Signed)
Date:  01/25/2018   Name:  Mary Macias   DOB:  July 18, 1966   MRN:  [id number]   Chief Complaint: Diabetes (Microalbumin. ) Diabetes  She presents for her follow-up diabetic visit. She has type 2 diabetes mellitus. Her disease course has been worsening. Pertinent negatives for hypoglycemia include no headaches or tremors. Pertinent negatives for diabetes include no chest pain, no fatigue, no polydipsia and no polyuria. Current diabetic treatment includes diet. She is compliant with treatment none of the time (has gained more weight). An ACE inhibitor/angiotensin II receptor blocker is not being taken. Eye exam is not current.  Hyperlipidemia  This is a chronic problem. Pertinent negatives include no chest pain or shortness of breath. Current antihyperlipidemic treatment includes statins (started last visit). There are no compliance problems.   Knee Pain   The pain is present in the left knee and right knee. The quality of the pain is described as aching. The pain has been fluctuating since onset. Associated symptoms include an inability to bear weight and a loss of motion. Pertinent negatives include no muscle weakness or numbness. The symptoms are aggravated by weight bearing. She has tried NSAIDs for the symptoms. The treatment provided significant (but mobic stopped due to decreased rend funcition) relief.   Lab Results  Component Value Date   HGBA1C 6.4 (H) 10/26/2017   Lab Results  Component Value Date   CHOL 204 (H) 07/17/2016   HDL 47 07/17/2016   LDLCALC 106 (H) 07/17/2016   TRIG 254 (H) 07/17/2016   CHOLHDL 4.3 07/17/2016   Lab Results  Component Value Date   CREATININE 0.92 10/26/2017   BUN 14 10/26/2017   NA 140 10/26/2017   K 4.7 10/26/2017   CL 99 10/26/2017   CO2 23 10/26/2017    Wt Readings from Last 3 Encounters:  01/25/18 (!) 314 lb (142.4 kg)  12/25/17 (!) 313 lb (142 kg)  10/26/17 (!) 302 lb (137 kg)      Review of Systems  Constitutional:  Negative for appetite change, fatigue, fever and unexpected weight change.  HENT: Negative for tinnitus and trouble swallowing.   Eyes: Negative for visual disturbance.  Respiratory: Negative for cough, chest tightness and shortness of breath.   Cardiovascular: Negative for chest pain, palpitations and leg swelling.  Gastrointestinal: Negative for abdominal pain.  Endocrine: Negative for polydipsia and polyuria.  Genitourinary: Negative for dysuria and hematuria.  Musculoskeletal: Negative for arthralgias.  Neurological: Negative for tremors, numbness and headaches.  Psychiatric/Behavioral: Negative for dysphoric mood.    Patient Active Problem List   Diagnosis Date Noted  . Stable angina pectoris ([place]) 08/10/2017  . Colon polyp, hyperplastic 07/16/2017  . Type 2 diabetes mellitus without complication, without long-term current use of insulin ([place]) 08/18/2015  . Hyperlipidemia associated with type 2 diabetes mellitus ([place]) 07/08/2015  . Environmental and seasonal allergies 07/08/2015  . Avitaminosis D 07/08/2015  . Beat, premature ventricular 02/11/2015  . Awareness of heartbeats 02/11/2015    Prior to Admission medications   Medication Sig Start Date End Date Taking? Authorizing Provider  acetaminophen (TYLENOL) 650 MG CR tablet Take 650 mg by mouth every 8 (eight) hours as needed for pain.    [provider]  acyclovir ointment (ZOVIRAX) 5 % Apply 1 application topically every 3 (three) hours. 08/25/16   Glean Hess, MD  atorvastatin (LIPITOR) 10 MG tablet Take 1 tablet (10 mg total) by mouth daily. 10/28/17   Glean Hess, MD  Cholecalciferol (VITAMIN  D3) 5000 units TABS Take 1 tablet by mouth daily.    [provider]  fexofenadine-pseudoephedrine (ALLEGRA-D 24) 180-240 MG 24 hr tablet Take 1 tablet by mouth daily. 08/02/16   Glean Hess, MD  Misc Natural Products (OSTEO BI-FLEX/5-LOXIN ADVANCED PO) Take by mouth.    [provider]    mupirocin ointment (BACTROBAN) 2 % Place 1 application into the nose 2 (two) times daily. 12/25/17   Juline Patch, MD  Probiotic Product (PROBIOTIC-10 PO) Take by mouth.    [provider]  triamcinolone (NASACORT ALLERGY 24HR) 55 MCG/ACT AERO nasal inhaler Place 2 sprays into the nose daily.    [provider]  valACYclovir (VALTREX) 1000 MG tablet Take by mouth. 08/25/16   [provider]    Allergies  Allergen Reactions  . Povidone Iodine Rash    Other Reaction: Other reaction  . Cephalexin Rash  . Metformin And Related Diarrhea  . Peanut-Containing Drug Products Rash    Past Surgical History:  Procedure Laterality Date  . ABDOMINAL HYSTERECTOMY    . CHOLECYSTECTOMY    . COLONOSCOPY WITH PROPOFOL N/A 07/13/2017   Procedure: COLONOSCOPY WITH PROPOFOL;  Surgeon: Lollie Sails, MD;  Location: [facility] ENDOSCOPY;  Service: Endoscopy;  Laterality: N/A;  . NASAL SINUS SURGERY    . TOTAL VAGINAL HYSTERECTOMY    . TUBAL LIGATION      Social History   Tobacco Use  . Smoking status: Never Smoker  . Smokeless tobacco: Never Used  Substance Use Topics  . Alcohol use: No    Alcohol/week: 0.0 oz  . Drug use: No     Medication list has been reviewed and updated.  PHQ 2/9 Scores 07/26/2017 12/03/2015  PHQ - 2 Score 0 0    Physical Exam  Constitutional: She is oriented to person, place, and time. She appears well-developed. No distress.  HENT:  Head: Normocephalic and atraumatic.  Neck: Normal range of motion. Neck supple. Carotid bruit is present.  Cardiovascular: Normal rate, regular rhythm and normal heart sounds.  Pulmonary/Chest: Effort normal and breath sounds normal. No respiratory distress.  Musculoskeletal: Normal range of motion.  Neurological: She is alert and oriented to person, place, and time.  Skin: Skin is warm and dry. No rash noted.  Psychiatric: She has a normal mood and affect. Her behavior is normal. Thought content normal.     BP 116/76   Pulse 65   Ht 5\' 5"  (1.651 m)   Wt (!) 314 lb (142.4 kg)   SpO2 96%   BMI 52.25 kg/m   Assessment and Plan: 1. Type 2 diabetes mellitus without complication, without long-term current use of insulin ([place]) May need to start medications Work harder on diet and wight loss - Hemoglobin A1c - Comprehensive metabolic panel - Microalbumin / creatinine urine ratio  2. Hyperlipidemia associated with type 2 diabetes mellitus (H[facility]) Tolerating statins - Lipid panel  3. Need for pneumococcal vaccination - Pneumococcal polysaccharide vaccine 23-valent greater than or equal to 2yo subcutaneous/IM  4. Primary osteoarthritis of both knees Will resume mobic but use PRN only - meloxicam (MOBIC) 15 MG tablet; Take 1 tablet (15 mg total) by mouth daily.  Dispense: 30 tablet; Refill: 5  5. Need for shingles vaccine Rx given - Zoster Vaccine Adjuvanted [facility]) injection; Inject 0.5 mLs into the muscle once for 1 dose.  Dispense: 0.5 mL; Refill: 1   Meds ordered this encounter  Medications  . meloxicam (MOBIC) 15 MG tablet  Sig: Take 1 tablet (15 mg total) by mouth daily.    Dispense:  30 tablet    Refill:  5  . Zoster Vaccine Adjuvanted [facility]) injection    Sig: Inject 0.5 mLs into the muscle once for 1 dose.    Dispense:  0.5 mL    Refill:  1    Partially dictated using Editor, commissioning. Any errors are unintentional.  Halina Maidens, MD [facility]  01/25/2018

## 2018-01-26 ENCOUNTER — Other Ambulatory Visit: Payer: Self-pay | Admitting: Internal Medicine

## 2018-01-26 DIAGNOSIS — E119 Type 2 diabetes mellitus without complications: Secondary | ICD-10-CM

## 2018-01-26 LAB — COMPREHENSIVE METABOLIC PANEL
ALT: 42 IU/L — AB (ref 0–32)
AST: 38 IU/L (ref 0–40)
Albumin/Globulin Ratio: 1.3 (ref 1.2–2.2)
Albumin: 4.1 g/dL (ref 3.5–5.5)
Alkaline Phosphatase: 74 IU/L (ref 39–117)
BILIRUBIN TOTAL: 0.7 mg/dL (ref 0.0–1.2)
BUN/Creatinine Ratio: 11 (ref 9–23)
BUN: 12 mg/dL (ref 6–24)
CALCIUM: 9.3 mg/dL (ref 8.7–10.2)
CHLORIDE: 100 mmol/L (ref 96–106)
CO2: 21 mmol/L (ref 20–29)
Creatinine, Ser: 1.08 mg/dL — ABNORMAL HIGH (ref 0.57–1.00)
GFR calc non Af Amer: 59 mL/min/{1.73_m2} — ABNORMAL LOW (ref >59)
GFR, EST AFRICAN AMERICAN: 68 mL/min/{1.73_m2} (ref >59)
GLUCOSE: 137 mg/dL — AB (ref 65–99)
Globulin, Total: 3.2 g/dL (ref 1.5–4.5)
Potassium: 4.3 mmol/L (ref 3.5–5.2)
Sodium: 140 mmol/L (ref 134–144)
TOTAL PROTEIN: 7.3 g/dL (ref 6.0–8.5)

## 2018-01-26 LAB — LIPID PANEL
CHOL/HDL RATIO: 3 ratio (ref 0.0–4.4)
Cholesterol, Total: 150 mg/dL (ref 100–199)
HDL: 50 mg/dL (ref >39)
LDL Calculated: 69 mg/dL (ref 0–99)
Triglycerides: 156 mg/dL — ABNORMAL HIGH (ref 0–149)
VLDL Cholesterol Cal: 31 mg/dL (ref 5–40)

## 2018-01-26 LAB — HEMOGLOBIN A1C
Est. average glucose Bld gHb Est-mCnc: 174 mg/dL
HEMOGLOBIN A1C: 7.7 % — AB (ref 4.8–5.6)

## 2018-01-26 MED ORDER — SITAGLIPTIN PHOSPHATE 100 MG PO TABS: 100.0000 mg | ORAL_TABLET | Freq: Every day | ORAL | 1 refills | Status: DC

## 2018-01-31 LAB — MICROALBUMIN / CREATININE URINE RATIO
Creatinine, Urine: 124.9 mg/dL
Microalb/Creat Ratio: 4.6 mg/g creat (ref 0.0–30.0)
Microalbumin, Urine: 5.8 ug/mL

## 2018-02-08 ENCOUNTER — Encounter: Payer: Self-pay | Admitting: Internal Medicine

## 2018-02-08 ENCOUNTER — Ambulatory Visit (INDEPENDENT_AMBULATORY_CARE_PROVIDER_SITE_OTHER): Payer: 59 | Admitting: Internal Medicine

## 2018-02-08 VITALS — BP 122/82 | HR 81 | Resp 16 | Ht 65.0 in | Wt 316.0 lb

## 2018-02-08 DIAGNOSIS — R600 Localized edema: Secondary | ICD-10-CM | POA: Insufficient documentation

## 2018-02-08 DIAGNOSIS — R609 Edema, unspecified: Secondary | ICD-10-CM

## 2018-02-08 NOTE — Progress Notes (Signed)
Date:  02/08/2018   Name:  Mary Macias   DOB:  Apr 05, 1966   MRN:  [id number]   Chief Complaint: Edema (1 week and started after traveling was way worse has gone down some. )  She noted swelling starting last week.  Then she traveled by plane and it become worse, in both legs with some discomfort, no redness or fever. She has been elevating with significant improvement.   Review of Systems  Constitutional: Negative for chills, fatigue and fever.  Respiratory: Negative for chest tightness and shortness of breath.   Cardiovascular: Positive for leg swelling. Negative for chest pain.  Skin: Negative for color change and rash.    Patient Active Problem List   Diagnosis Date Noted  . Morbid (severe) obesity due to excess calories ([place]) 01/25/2018  . Stable angina pectoris ([place]) 08/10/2017  . Colon polyp, hyperplastic 07/16/2017  . Type 2 diabetes mellitus without complication, without long-term current use of insulin ([place]) 08/18/2015  . Hyperlipidemia associated with type 2 diabetes mellitus ([place]) 07/08/2015  . Environmental and seasonal allergies 07/08/2015  . Avitaminosis D 07/08/2015  . Beat, premature ventricular 02/11/2015  . Awareness of heartbeats 02/11/2015    Prior to Admission medications   Medication Sig Start Date End Date Taking? Authorizing Provider  acetaminophen (TYLENOL) 650 MG CR tablet Take 650 mg by mouth every 8 (eight) hours as needed for pain.    [provider]  acyclovir ointment (ZOVIRAX) 5 % Apply 1 application topically every 3 (three) hours. 08/25/16   Glean Hess, MD  atorvastatin (LIPITOR) 10 MG tablet Take 1 tablet (10 mg total) by mouth daily. 10/28/17   Glean Hess, MD  Cholecalciferol (VITAMIN D3) 5000 units TABS Take 1 tablet by mouth daily.    [provider]  fexofenadine-pseudoephedrine (ALLEGRA-D 24) 180-240 MG 24 hr tablet Take 1 tablet by mouth daily. 08/02/16   Glean Hess, MD  meloxicam (MOBIC) 15  MG tablet Take 1 tablet (15 mg total) by mouth daily. 01/25/18   Glean Hess, MD  Misc Natural Products (OSTEO BI-FLEX/5-LOXIN ADVANCED PO) Take by mouth.    [provider]  mupirocin ointment (BACTROBAN) 2 % Place 1 application into the nose 2 (two) times daily. 12/25/17   Juline Patch, MD  Probiotic Product (PROBIOTIC-10 PO) Take by mouth.    [provider]  sitaGLIPtin (JANUVIA) 100 MG tablet Take 1 tablet (100 mg total) by mouth daily. 01/26/18   Glean Hess, MD  triamcinolone (NASACORT ALLERGY 24HR) 55 MCG/ACT AERO nasal inhaler Place 2 sprays into the nose daily.    [provider]  valACYclovir (VALTREX) 1000 MG tablet Take by mouth. 08/25/16   [provider]    Allergies  Allergen Reactions  . Povidone Iodine Rash    Other Reaction: Other reaction  . Cephalexin Rash  . Metformin And Related Diarrhea  . Peanut-Containing Drug Products Rash    Past Surgical History:  Procedure Laterality Date  . ABDOMINAL HYSTERECTOMY    . CHOLECYSTECTOMY    . COLONOSCOPY WITH PROPOFOL N/A 07/13/2017   Procedure: COLONOSCOPY WITH PROPOFOL;  Surgeon: Lollie Sails, MD;  Location: [facility] ENDOSCOPY;  Service: Endoscopy;  Laterality: N/A;  . NASAL SINUS SURGERY    . TOTAL VAGINAL HYSTERECTOMY    . TUBAL LIGATION      Social History   Tobacco Use  . Smoking status: Never Smoker  . Smokeless tobacco: Never Used  Substance Use Topics  .  Alcohol use: No    Alcohol/week: 0.0 oz  . Drug use: No     Medication list has been reviewed and updated.  PHQ 2/9 Scores 07/26/2017 12/03/2015  PHQ - 2 Score 0 0    Physical Exam  Constitutional: She is oriented to person, place, and time. She appears well-developed. No distress.  HENT:  Head: Normocephalic and atraumatic.  Cardiovascular: Normal rate, regular rhythm and normal heart sounds.  Pulses:      Dorsalis pedis pulses are 2+ on the right side, and 2+ on the left side.       Posterior  tibial pulses are 1+ on the right side, and 1+ on the left side.  Pulmonary/Chest: Effort normal and breath sounds normal. No respiratory distress.  Musculoskeletal: Normal range of motion. She exhibits edema (1+ to mid tibia bilaterally).  No cord or calf tenderness Negative homan's sign  Neurological: She is alert and oriented to person, place, and time.  Skin: Skin is warm and dry. No rash noted.  Psychiatric: She has a normal mood and affect. Her behavior is normal. Thought content normal.    BP 122/82   Pulse 81   Resp 16   Ht 5\' 5"  (1.651 m)   Wt (!) 316 lb (143.3 kg)   SpO2 98%   BMI 52.59 kg/m   Assessment and Plan: 1. Peripheral edema Continue elevation, sodium restriction Wear compression stockings when traveling No diuretics for now   No orders of the defined types were placed in this encounter.   Partially dictated using Editor, commissioning. Any errors are unintentional.  Halina Maidens, MD [facility]  02/08/2018

## 2018-04-05 ENCOUNTER — Encounter: Payer: Self-pay | Admitting: Internal Medicine

## 2018-04-05 ENCOUNTER — Ambulatory Visit (INDEPENDENT_AMBULATORY_CARE_PROVIDER_SITE_OTHER): Payer: 59 | Admitting: Internal Medicine

## 2018-04-05 VITALS — BP 122/70 | HR 69 | Temp 97.6°F | Resp 16 | Ht 65.0 in | Wt 318.0 lb

## 2018-04-05 DIAGNOSIS — R059 Cough, unspecified: Secondary | ICD-10-CM

## 2018-04-05 DIAGNOSIS — R05 Cough: Secondary | ICD-10-CM | POA: Diagnosis not present

## 2018-04-05 MED ORDER — MONTELUKAST SODIUM 10 MG PO TABS: 10.0000 mg | ORAL_TABLET | Freq: Every day | ORAL | 2 refills | Status: DC

## 2018-04-05 MED ORDER — BENZONATATE 100 MG PO CAPS: 100.0000 mg | ORAL_CAPSULE | Freq: Three times a day (TID) | ORAL | 0 refills | Status: DC

## 2018-04-05 NOTE — Progress Notes (Signed)
Date:  04/05/2018   Name:  Mary Macias   DOB:  [birth year]   MRN:  [id number]   Chief Complaint: Cough and Shortness of Breath Cough  This is a new problem. The current episode started 1 to 4 weeks ago. The problem has been unchanged. The problem occurs hourly. The cough is non-productive. Pertinent negatives include no chest pain, chills, ear congestion, ear pain, fever, headaches, nasal congestion, postnasal drip, sore throat, weight loss or wheezing. Nothing aggravates the symptoms. Mary Macias has tried nothing (had a cold that settled in her chest - all the sx are gone except the cough) for the symptoms. Improvement on treatment: daughter suggested an inhaler but Mary Macias has never used one. Her past medical history is significant for environmental allergies.   Mary Macias has allergies and is getting allergy injections from Dr. Kathyrn Sheriff.  Mary Macias uses [facility], medical.    Review of Systems  Constitutional: Negative for chills, diaphoresis, fatigue, fever and weight loss.  HENT: Negative for ear discharge, ear pain, postnasal drip, sinus pressure, sinus pain, sore throat and trouble swallowing.   Respiratory: Positive for cough and chest tightness. Negative for wheezing.   Cardiovascular: Negative for chest pain and palpitations.  Allergic/Immunologic: Positive for environmental allergies.  Neurological: Negative for dizziness and headaches.    Patient Active Problem List   Diagnosis Date Noted  . Peripheral edema 02/08/2018  . Morbid (severe) obesity due to excess calories ([place]) 01/25/2018  . Stable angina pectoris ([place]) 08/10/2017  . Colon polyp, hyperplastic 07/16/2017  . Type 2 diabetes mellitus without complication, without long-term current use of insulin ([place]) 08/18/2015  . Hyperlipidemia associated with type 2 diabetes mellitus ([place]) 07/08/2015  . Environmental and seasonal allergies 07/08/2015  . Avitaminosis D 07/08/2015  . Beat, premature ventricular 02/11/2015  . Awareness of  heartbeats 02/11/2015    Prior to Admission medications   Medication Sig Start Date End Date Taking? Authorizing Provider  acetaminophen (TYLENOL) 650 MG CR tablet Take 650 mg by mouth every 8 (eight) hours as needed for pain.   Yes [provider]  acyclovir ointment (ZOVIRAX) 5 % Apply 1 application topically every 3 (three) hours. 08/25/16  Yes Glean Hess, MD  atorvastatin (LIPITOR) 10 MG tablet Take 1 tablet (10 mg total) by mouth daily. 10/28/17  Yes Glean Hess, MD  Cholecalciferol (VITAMIN D3) 5000 units TABS Take 1 tablet by mouth daily.   Yes [provider]  fexofenadine (ALLEGRA) 180 MG tablet Take 180 mg by mouth daily.   Yes [provider]  Misc Natural Products (OSTEO BI-FLEX/5-LOXIN ADVANCED PO) Take by mouth.   Yes [provider]  Probiotic Product (PROBIOTIC-10 PO) Take by mouth.   Yes [provider]  sitaGLIPtin (JANUVIA) 100 MG tablet Take 1 tablet (100 mg total) by mouth daily. 01/26/18  Yes Glean Hess, MD  triamcinolone (NASACORT ALLERGY 24HR) 55 MCG/ACT AERO nasal inhaler Place 2 sprays into the nose daily.   Yes [provider]  valACYclovir (VALTREX) 1000 MG tablet Take by mouth. 08/25/16  Yes [provider]  mupirocin ointment (BACTROBAN) 2 % Place 1 application into the nose 2 (two) times daily. Patient not taking: Reported on 04/05/2018 12/25/17   Juline Patch, MD    Allergies  Allergen Reactions  . Povidone Iodine Rash    Other Reaction: Other reaction  . Meloxicam Other (See Comments)    Kidney function reduced   . Cephalexin Rash  . Metformin And Related  Diarrhea  . Peanut-Containing Drug Products Rash    Past Surgical History:  Procedure Laterality Date  . ABDOMINAL HYSTERECTOMY    . CHOLECYSTECTOMY    . COLONOSCOPY WITH PROPOFOL N/A 07/13/2017   Procedure: COLONOSCOPY WITH PROPOFOL;  Surgeon: Lollie Sails, MD;  Location: [facility] ENDOSCOPY;  Service: Endoscopy;   Laterality: N/A;  . NASAL SINUS SURGERY    . TOTAL VAGINAL HYSTERECTOMY    . TUBAL LIGATION      Social History   Tobacco Use  . Smoking status: Never Smoker  . Smokeless tobacco: Never Used  Substance Use Topics  . Alcohol use: No    Alcohol/week: 0.0 oz  . Drug use: No     Medication list has been reviewed and updated.  Current Meds  Medication Sig  . acetaminophen (TYLENOL) 650 MG CR tablet Take 650 mg by mouth every 8 (eight) hours as needed for pain.  Marland Kitchen acyclovir ointment (ZOVIRAX) 5 % Apply 1 application topically every 3 (three) hours.  Marland Kitchen atorvastatin (LIPITOR) 10 MG tablet Take 1 tablet (10 mg total) by mouth daily.  . Cholecalciferol (VITAMIN D3) 5000 units TABS Take 1 tablet by mouth daily.  . fexofenadine (ALLEGRA) 180 MG tablet Take 180 mg by mouth daily.  . Misc Natural Products (OSTEO BI-FLEX/5-LOXIN ADVANCED PO) Take by mouth.  . Probiotic Product (PROBIOTIC-10 PO) Take by mouth.  . sitaGLIPtin (JANUVIA) 100 MG tablet Take 1 tablet (100 mg total) by mouth daily.  Marland Kitchen triamcinolone (NASACORT ALLERGY 24HR) 55 MCG/ACT AERO nasal inhaler Place 2 sprays into the nose daily.  . valACYclovir (VALTREX) 1000 MG tablet Take by mouth.  . [DISCONTINUED] fexofenadine-pseudoephedrine (ALLEGRA-D 24) 180-240 MG 24 hr tablet Take 1 tablet by mouth daily.    PHQ 2/9 Scores 07/26/2017 12/03/2015  PHQ - 2 Score 0 0    Physical Exam  Constitutional: Mary Macias is oriented to person, place, and time. Mary Macias appears well-developed. No distress.  HENT:  Head: Normocephalic and atraumatic.  Right Ear: Tympanic membrane and ear canal normal.  Left Ear: Tympanic membrane and ear canal normal.  Nose: Right sinus exhibits no maxillary sinus tenderness and no frontal sinus tenderness. Left sinus exhibits no maxillary sinus tenderness and no frontal sinus tenderness.  Mouth/Throat: Posterior oropharyngeal erythema present. No oropharyngeal exudate or posterior oropharyngeal edema.  Neck: Normal  range of motion. Neck supple.  Cardiovascular: Normal rate, regular rhythm and normal heart sounds.  Pulmonary/Chest: Effort normal and breath sounds normal. No accessory muscle usage. No respiratory distress. Mary Macias has no wheezes. Mary Macias has no rales.  Musculoskeletal: Normal range of motion.  Lymphadenopathy:    Mary Macias has no cervical adenopathy.  Neurological: Mary Macias is alert and oriented to person, place, and time.  Skin: Skin is warm and dry. No rash noted.  Psychiatric: Mary Macias has a normal mood and affect. Her behavior is normal. Thought content normal.  Nursing note and vitals reviewed.   BP 122/70   Pulse 69   Temp 97.6 F (36.4 C) (Oral)   Resp 16   Ht 5\' 5"  (1.651 m)   Wt (!) 318 lb (144.2 kg)   SpO2 97%   BMI 52.92 kg/m   Assessment and Plan: 1. Cough Cough residual from URI and possibly allergic Albuterol likely to be of little benefit - montelukast (SINGULAIR) 10 MG tablet; Take 1 tablet (10 mg total) by mouth at bedtime.  Dispense: 30 tablet; Refill: 2 - benzonatate (TESSALON) 100 MG capsule; Take 1 capsule (100 mg total) by mouth 3 (  three) times daily.  Dispense: 30 capsule; Refill: 0   Meds ordered this encounter  Medications  . montelukast (SINGULAIR) 10 MG tablet    Sig: Take 1 tablet (10 mg total) by mouth at bedtime.    Dispense:  30 tablet    Refill:  2  . benzonatate (TESSALON) 100 MG capsule    Sig: Take 1 capsule (100 mg total) by mouth 3 (three) times daily.    Dispense:  30 capsule    Refill:  0    Partially dictated using Editor, commissioning. Any errors are unintentional.  Halina Maidens, MD [facility]  04/05/2018   There are no diagnoses linked to this encounter.

## 2018-04-10 LAB — HM DIABETES EYE EXAM

## 2018-05-27 ENCOUNTER — Ambulatory Visit: Payer: 59 | Admitting: Internal Medicine

## 2018-06-14 ENCOUNTER — Encounter: Payer: Self-pay | Admitting: Internal Medicine

## 2018-06-14 ENCOUNTER — Ambulatory Visit (INDEPENDENT_AMBULATORY_CARE_PROVIDER_SITE_OTHER): Payer: 59 | Admitting: Internal Medicine

## 2018-06-14 VITALS — BP 110/74 | HR 54 | Ht 65.0 in | Wt 312.0 lb

## 2018-06-14 DIAGNOSIS — M1710 Unilateral primary osteoarthritis, unspecified knee: Secondary | ICD-10-CM

## 2018-06-14 DIAGNOSIS — E119 Type 2 diabetes mellitus without complications: Secondary | ICD-10-CM | POA: Diagnosis not present

## 2018-06-14 DIAGNOSIS — Z23 Encounter for immunization: Secondary | ICD-10-CM | POA: Diagnosis not present

## 2018-06-14 DIAGNOSIS — E1169 Type 2 diabetes mellitus with other specified complication: Secondary | ICD-10-CM

## 2018-06-14 DIAGNOSIS — I493 Ventricular premature depolarization: Secondary | ICD-10-CM

## 2018-06-14 DIAGNOSIS — E785 Hyperlipidemia, unspecified: Secondary | ICD-10-CM

## 2018-06-14 MED ORDER — SITAGLIPTIN PHOSPHATE 100 MG PO TABS: 100.0000 mg | ORAL_TABLET | Freq: Every day | ORAL | 1 refills | Status: DC

## 2018-06-14 NOTE — Progress Notes (Signed)
Date:  06/14/2018   Name:  Mary Macias   DOB:  Feb 05, 1966   MRN:  [id number]   Chief Complaint: Diabetes and Hypertension Diabetes  She presents for her follow-up diabetic visit. She has type 2 diabetes mellitus. Pertinent negatives for hypoglycemia include no headaches or tremors. Pertinent negatives for diabetes include no chest pain, no fatigue, no polydipsia and no polyuria. Current diabetic treatment includes oral agent (monotherapy). She is compliant with treatment most of the time. She rarely participates in exercise. An ACE inhibitor/angiotensin II receptor blocker is contraindicated.  Hyperlipidemia  This is a chronic problem. Pertinent negatives include no chest pain or shortness of breath. She is currently on no antihyperlipidemic treatment. Compliance problems include medication side effects.   Knee Pain   There was no injury mechanism. The pain is present in the right knee. The quality of the pain is described as aching. The pain is moderate. The pain has been fluctuating since onset. Pertinent negatives include no numbness. The symptoms are aggravated by weight bearing and movement. Treatments tried: cortisone injection. The treatment provided moderate relief.   Lab Results  Component Value Date   HGBA1C 7.7 (H) 01/25/2018     Review of Systems  Constitutional: Negative for appetite change, fatigue, fever and unexpected weight change.  HENT: Negative for tinnitus and trouble swallowing.   Eyes: Negative for visual disturbance.  Respiratory: Negative for cough, chest tightness and shortness of breath.   Cardiovascular: Negative for chest pain, palpitations and leg swelling.  Gastrointestinal: Negative for abdominal pain.  Endocrine: Negative for polydipsia and polyuria.  Genitourinary: Negative for dysuria and hematuria.  Musculoskeletal: Negative for arthralgias.  Neurological: Negative for tremors, numbness and headaches.  Psychiatric/Behavioral: Negative for  dysphoric mood.    Patient Active Problem List   Diagnosis Date Noted  . Peripheral edema 02/08/2018  . Morbid (severe) obesity due to excess calories ([place]) 01/25/2018  . Stable angina pectoris ([place]) 08/10/2017  . Colon polyp, hyperplastic 07/16/2017  . Type 2 diabetes mellitus without complication, without long-term current use of insulin ([place]) 08/18/2015  . Hyperlipidemia associated with type 2 diabetes mellitus ([place]) 07/08/2015  . Environmental and seasonal allergies 07/08/2015  . Avitaminosis D 07/08/2015  . Beat, premature ventricular 02/11/2015  . Awareness of heartbeats 02/11/2015    Allergies  Allergen Reactions  . Povidone Iodine Rash    Other Reaction: Other reaction  . Meloxicam Other (See Comments)    Kidney function reduced   . Cephalexin Rash  . Metformin And Related Diarrhea  . Peanut-Containing Drug Products Rash    Past Surgical History:  Procedure Laterality Date  . ABDOMINAL HYSTERECTOMY    . CHOLECYSTECTOMY    . COLONOSCOPY WITH PROPOFOL N/A 07/13/2017   Procedure: COLONOSCOPY WITH PROPOFOL;  Surgeon: Lollie Sails, MD;  Location: [facility] ENDOSCOPY;  Service: Endoscopy;  Laterality: N/A;  . NASAL SINUS SURGERY    . TOTAL VAGINAL HYSTERECTOMY    . TUBAL LIGATION      Social History   Tobacco Use  . Smoking status: Never Smoker  . Smokeless tobacco: Never Used  Substance Use Topics  . Alcohol use: No    Alcohol/week: 0.0 standard drinks  . Drug use: No     Medication list has been reviewed and updated.  Current Meds  Medication Sig  . acetaminophen (TYLENOL) 650 MG CR tablet Take 650 mg by mouth every 8 (eight) hours as needed for pain.  Marland Kitchen acyclovir ointment (ZOVIRAX) 5 % Apply 1 application  topically every 3 (three) hours.  . Cholecalciferol (VITAMIN D3) 5000 units TABS Take 1 tablet by mouth daily.  . fexofenadine (ALLEGRA) 180 MG tablet Take 180 mg by mouth daily.  . Misc Natural Products (OSTEO BI-FLEX/5-LOXIN ADVANCED PO) Take by  mouth.  . Probiotic Product (PROBIOTIC-10 PO) Take by mouth.  . sitaGLIPtin (JANUVIA) 100 MG tablet Take 1 tablet (100 mg total) by mouth daily.  Marland Kitchen triamcinolone (NASACORT ALLERGY 24HR) 55 MCG/ACT AERO nasal inhaler Place 2 sprays into the nose daily.  . valACYclovir (VALTREX) 1000 MG tablet Take by mouth.    PHQ 2/9 Scores 07/26/2017 12/03/2015  PHQ - 2 Score 0 0    Physical Exam  Constitutional: She is oriented to person, place, and time. She appears well-developed. No distress.  HENT:  Head: Normocephalic and atraumatic.  Neck: Normal range of motion. Neck supple.  Cardiovascular: Normal rate, regular rhythm and normal heart sounds.  Occasional extrasystoles are present.  No murmur heard. Pulmonary/Chest: Effort normal and breath sounds normal. No respiratory distress.  Musculoskeletal: She exhibits edema.       Right shoulder: She exhibits decreased range of motion and tenderness. She exhibits no effusion.       Right knee: She exhibits decreased range of motion. Tenderness found. Medial joint line tenderness noted.  Lymphadenopathy:    She has no cervical adenopathy.  Neurological: She is alert and oriented to person, place, and time.  Skin: Skin is warm and dry. No rash noted. No erythema.  Psychiatric: She has a normal mood and affect. Her behavior is normal. Thought content normal.  Nursing note and vitals reviewed.   BP 110/74 (BP Location: Right Arm, Patient Position: Sitting, Cuff Size: Large)   Pulse (!) 54   Ht 5\' 5"  (1.651 m)   Wt (!) 312 lb (141.5 kg)   SpO2 96%   BMI 51.92 kg/m   Assessment and Plan: 1. Arthritis of knee Follow up with Ortho  2. Type 2 diabetes mellitus without complication, without long-term current use of insulin (HCC) Continue current therapy - Hemoglobin K9T - Basic metabolic panel - sitaGLIPtin (JANUVIA) 100 MG tablet; Take 1 tablet (100 mg total) by mouth daily.  Dispense: 90 tablet; Refill: 1  3. Hyperlipidemia associated with  type 2 diabetes mellitus (HCC) Samples Livalo 2 mg given  4. Beat, premature ventricular Minimally sx - no need for daily suppression  5. Need for influenza vaccination - Flu Vaccine QUAD 36+ mos IM   Meds ordered this encounter  Medications  . sitaGLIPtin (JANUVIA) 100 MG tablet    Sig: Take 1 tablet (100 mg total) by mouth daily.    Dispense:  90 tablet    Refill:  1    Partially dictated using Editor, commissioning. Any errors are unintentional.  Halina Maidens, MD [facility]  06/14/2018   There are no diagnoses linked to this encounter.

## 2018-06-15 LAB — BASIC METABOLIC PANEL
BUN / CREAT RATIO: 13 (ref 9–23)
BUN: 13 mg/dL (ref 6–24)
CHLORIDE: 103 mmol/L (ref 96–106)
CO2: 24 mmol/L (ref 20–29)
CREATININE: 1.01 mg/dL — AB (ref 0.57–1.00)
Calcium: 9.1 mg/dL (ref 8.7–10.2)
GFR calc non Af Amer: 64 mL/min/{1.73_m2} (ref >59)
GFR, EST AFRICAN AMERICAN: 73 mL/min/{1.73_m2} (ref >59)
GLUCOSE: 109 mg/dL — AB (ref 65–99)
Potassium: 4.7 mmol/L (ref 3.5–5.2)
SODIUM: 146 mmol/L — AB (ref 134–144)

## 2018-06-15 LAB — HEMOGLOBIN A1C
ESTIMATED AVERAGE GLUCOSE: 146 mg/dL
Hgb A1c MFr Bld: 6.7 % — ABNORMAL HIGH (ref 4.8–5.6)

## 2018-06-18 ENCOUNTER — Other Ambulatory Visit: Payer: Self-pay | Admitting: Orthopedic Surgery

## 2018-06-18 DIAGNOSIS — M25561 Pain in right knee: Secondary | ICD-10-CM

## 2018-06-18 DIAGNOSIS — M25361 Other instability, right knee: Secondary | ICD-10-CM

## 2018-06-18 DIAGNOSIS — G8929 Other chronic pain: Secondary | ICD-10-CM

## 2018-06-27 ENCOUNTER — Encounter: Payer: Self-pay | Admitting: Internal Medicine

## 2018-06-28 ENCOUNTER — Ambulatory Visit
Admission: RE | Admit: 2018-06-28 | Discharge: 2018-06-28 | Disposition: A | Discharge status: Home / Self Care | Payer: 59 | Source: Ambulatory Visit | Attending: Orthopedic Surgery | Admitting: Orthopedic Surgery

## 2018-06-28 DIAGNOSIS — M25561 Pain in right knee: Secondary | ICD-10-CM | POA: Insufficient documentation

## 2018-06-28 DIAGNOSIS — M1711 Unilateral primary osteoarthritis, right knee: Secondary | ICD-10-CM | POA: Diagnosis not present

## 2018-06-28 DIAGNOSIS — G8929 Other chronic pain: Secondary | ICD-10-CM | POA: Insufficient documentation

## 2018-06-28 DIAGNOSIS — M25361 Other instability, right knee: Secondary | ICD-10-CM | POA: Diagnosis present

## 2018-07-15 ENCOUNTER — Other Ambulatory Visit: Payer: Self-pay | Admitting: Internal Medicine

## 2018-07-15 DIAGNOSIS — R059 Cough, unspecified: Secondary | ICD-10-CM

## 2018-07-15 DIAGNOSIS — R05 Cough: Secondary | ICD-10-CM

## 2018-07-26 ENCOUNTER — Encounter: Payer: Self-pay | Admitting: Internal Medicine

## 2018-07-26 ENCOUNTER — Ambulatory Visit (INDEPENDENT_AMBULATORY_CARE_PROVIDER_SITE_OTHER): Payer: 59 | Admitting: Internal Medicine

## 2018-07-26 VITALS — BP 104/78 | HR 58 | Temp 97.5°F | Ht 65.0 in | Wt 311.0 lb

## 2018-07-26 DIAGNOSIS — J0100 Acute maxillary sinusitis, unspecified: Secondary | ICD-10-CM | POA: Diagnosis not present

## 2018-07-26 DIAGNOSIS — J340 Abscess, furuncle and carbuncle of nose: Secondary | ICD-10-CM

## 2018-07-26 MED ORDER — MUPIROCIN CALCIUM 2 % NA OINT: 1.0000 "application " | TOPICAL_OINTMENT | Freq: Two times a day (BID) | NASAL | 0 refills | Status: DC

## 2018-07-26 MED ORDER — AMOXICILLIN-POT CLAVULANATE 875-125 MG PO TABS: 1.0000 | ORAL_TABLET | Freq: Two times a day (BID) | ORAL | 0 refills | Status: AC

## 2018-07-26 NOTE — Progress Notes (Signed)
Date:  07/26/2018   Name:  Mary Macias   DOB:  January 25, 1966   MRN:  [id number]   Chief Complaint: nose sores (Started week ago. Feels raw and bloody. Green. ) and Sinusitis (About a month. Teeth hurting, facial pressure. Constant running nose. Cough. )  Sinusitis  This is a new problem. The current episode started in the past 7 days. There has been no fever. Associated symptoms include congestion, coughing, sinus pressure and a sore throat. Pertinent negatives include no headaches or shortness of breath.    Review of Systems  HENT: Positive for congestion, sinus pressure and sore throat.   Respiratory: Positive for cough. Negative for chest tightness, shortness of breath and wheezing.   Cardiovascular: Negative for chest pain and palpitations.  Neurological: Negative for dizziness and headaches.    Patient Active Problem List   Diagnosis Date Noted  . Peripheral edema 02/08/2018  . Morbid (severe) obesity due to excess calories ([place]) 01/25/2018  . Colon polyp, hyperplastic 07/16/2017  . Type 2 diabetes mellitus without complication, without long-term current use of insulin ([place]) 08/18/2015  . Hyperlipidemia associated with type 2 diabetes mellitus ([place]) 07/08/2015  . Environmental and seasonal allergies 07/08/2015  . Avitaminosis D 07/08/2015  . Beat, premature ventricular 02/11/2015  . Awareness of heartbeats 02/11/2015    Allergies  Allergen Reactions  . Povidone Iodine Rash    Other Reaction: Other reaction  . Atorvastatin Other (See Comments)    Severe body aches  . Meloxicam Other (See Comments)    Kidney function reduced   . Cephalexin Rash  . Metformin And Related Diarrhea  . Peanut-Containing Drug Products Rash    Past Surgical History:  Procedure Laterality Date  . ABDOMINAL HYSTERECTOMY    . CHOLECYSTECTOMY    . COLONOSCOPY WITH PROPOFOL N/A 07/13/2017   Procedure: COLONOSCOPY WITH PROPOFOL;  Surgeon: Lollie Sails, MD;  Location: [facility]  ENDOSCOPY;  Service: Endoscopy;  Laterality: N/A;  . NASAL SINUS SURGERY    . TOTAL VAGINAL HYSTERECTOMY    . TUBAL LIGATION      Social History   Tobacco Use  . Smoking status: Never Smoker  . Smokeless tobacco: Never Used  Substance Use Topics  . Alcohol use: No    Alcohol/week: 0.0 standard drinks  . Drug use: No     Medication list has been reviewed and updated.  Current Meds  Medication Sig  . acetaminophen (TYLENOL) 650 MG CR tablet Take 650 mg by mouth every 8 (eight) hours as needed for pain.  Marland Kitchen acyclovir ointment (ZOVIRAX) 5 % Apply 1 application topically every 3 (three) hours.  . Cholecalciferol (VITAMIN D3) 5000 units TABS Take 1 tablet by mouth daily.  . fexofenadine (ALLEGRA) 180 MG tablet Take 180 mg by mouth daily.  . Misc Natural Products (OSTEO BI-FLEX/5-LOXIN ADVANCED PO) Take by mouth.  . montelukast (SINGULAIR) 10 MG tablet TAKE 1 TABLET BY MOUTH EVERYDAY AT BEDTIME  . Probiotic Product (PROBIOTIC-10 PO) Take by mouth.  . sitaGLIPtin (JANUVIA) 100 MG tablet Take 1 tablet (100 mg total) by mouth daily.  Marland Kitchen triamcinolone (NASACORT ALLERGY 24HR) 55 MCG/ACT AERO nasal inhaler Place 2 sprays into the nose daily.  . valACYclovir (VALTREX) 1000 MG tablet Take by mouth.    PHQ 2/9 Scores 07/26/2018 07/26/2017 12/03/2015  PHQ - 2 Score 0 0 0    Physical Exam  Constitutional: She is oriented to person, place, and time. She appears well-developed and well-nourished.  HENT:  Right Ear:  External ear and ear canal normal. Tympanic membrane is not erythematous and not retracted.  Left Ear: External ear and ear canal normal. Tympanic membrane is not erythematous and not retracted.  Nose: Right sinus exhibits maxillary sinus tenderness and frontal sinus tenderness. Left sinus exhibits maxillary sinus tenderness and frontal sinus tenderness.  Mouth/Throat: Uvula is midline and mucous membranes are normal. No oral lesions. Posterior oropharyngeal erythema present. No  oropharyngeal exudate.  Neck: Normal range of motion. Neck supple.  Cardiovascular: Normal rate, regular rhythm and normal heart sounds.  Pulmonary/Chest: Breath sounds normal. She has no wheezes. She has no rales.  Lymphadenopathy:    She has no cervical adenopathy.  Neurological: She is alert and oriented to person, place, and time.    BP 104/78 (BP Location: Right Arm, Patient Position: Sitting, Cuff Size: Large)   Pulse (!) 58   Temp (!) 97.5 F (36.4 C) (Oral)   Ht 5\' 5"  (1.651 m)   Wt (!) 311 lb (141.1 kg)   SpO2 100%   BMI 51.75 kg/m   Assessment and Plan: 1. Acute non-recurrent maxillary sinusitis Continue singulair - amoxicillin-clavulanate (AUGMENTIN) 875-125 MG tablet; Take 1 tablet by mouth 2 (two) times daily for 10 days.  Dispense: 20 tablet; Refill: 0  2. Nasal septum ulceration - mupirocin nasal ointment (BACTROBAN) 2 %; Place 1 application into the nose 2 (two) times daily.  Dispense: 10 g; Refill: 0   Partially dictated using Editor, commissioning. Any errors are unintentional.  Halina Maidens, MD [facility]  07/26/2018

## 2018-11-10 ENCOUNTER — Other Ambulatory Visit: Payer: Self-pay

## 2018-11-10 ENCOUNTER — Ambulatory Visit
Admission: EM | Admit: 2018-11-10 | Discharge: 2018-11-10 | Disposition: A | Discharge status: Home / Self Care | Payer: 59 | Attending: Family Medicine | Admitting: Family Medicine

## 2018-11-10 ENCOUNTER — Encounter: Payer: Self-pay | Admitting: Emergency Medicine

## 2018-11-10 DIAGNOSIS — L0231 Cutaneous abscess of buttock: Secondary | ICD-10-CM

## 2018-11-10 MED ORDER — DOXYCYCLINE HYCLATE 100 MG PO CAPS: 100.0000 mg | ORAL_CAPSULE | Freq: Two times a day (BID) | ORAL | 0 refills | Status: DC

## 2018-11-10 NOTE — ED Provider Notes (Signed)
[facility]    CSN: [id number] Arrival date & time: 11/10/18  1221  History   Chief Complaint Chief Complaint  Patient presents with  . Abscess   HPI  53 year old female presents with a buttock abscess.  Patient she has had a "boil" to her right buttock since Friday.  Painful.  She states that it "burst" today.  There was a lot of foul-smelling drainage.  Patient reports that she has had a low-grade temperature as she normally runs 97.5.  Patient otherwise feels well.  She has a history of abscess.  Patient states that she is predominantly concerned that she is going out of the country in the next few weeks and wants to make sure it has resolved.  He has been using Bactroban ointment as of today.  No other medication or interventions tried.  No other associated symptoms.  No other complaints.  PMH, Surgical Hx, Family Hx, Social History reviewed and updated as below.  Past Medical History:  Diagnosis Date  . Allergy   . Diabetes mellitus without complication ([place])   . Sleep apnea    cpap  . Stable angina pectoris ([place]) 08/10/2017   Patient Active Problem List   Diagnosis Date Noted  . Peripheral edema 02/08/2018  . Morbid (severe) obesity due to excess calories ([place]) 01/25/2018  . Colon polyp, hyperplastic 07/16/2017  . Type 2 diabetes mellitus without complication, without long-term current use of insulin ([place]) 08/18/2015  . Hyperlipidemia associated with type 2 diabetes mellitus ([place]) 07/08/2015  . Environmental and seasonal allergies 07/08/2015  . Avitaminosis D 07/08/2015  . Beat, premature ventricular 02/11/2015  . Awareness of heartbeats 02/11/2015   Past Surgical History:  Procedure Laterality Date  . ABDOMINAL HYSTERECTOMY    . CHOLECYSTECTOMY    . COLONOSCOPY WITH PROPOFOL N/A 07/13/2017   Procedure: COLONOSCOPY WITH PROPOFOL;  Surgeon: Lollie Sails, MD;  Location: [facility] ENDOSCOPY;  Service: Endoscopy;  Laterality: N/A;  . NASAL SINUS SURGERY    .  TOTAL VAGINAL HYSTERECTOMY    . TUBAL LIGATION     OB History   No obstetric history on file.    Home Medications    Prior to Admission medications   Medication Sig Start Date End Date Taking? Authorizing Provider  acetaminophen (TYLENOL) 650 MG CR tablet Take 650 mg by mouth every 8 (eight) hours as needed for pain.   Yes [provider]  Cholecalciferol (VITAMIN D3) 5000 units TABS Take 1 tablet by mouth daily.   Yes [provider]  fexofenadine (ALLEGRA) 180 MG tablet Take 180 mg by mouth daily.   Yes [provider]  Misc Natural Products (OSTEO BI-FLEX/5-LOXIN ADVANCED PO) Take by mouth.   Yes [provider]  montelukast (SINGULAIR) 10 MG tablet TAKE 1 TABLET BY MOUTH EVERYDAY AT BEDTIME 07/16/18  Yes Glean Hess, MD  Pitavastatin Calcium (LIVALO) 2 MG TABS Take 1 tablet by mouth daily. Pt given samples   Yes [provider]  Probiotic Product (PROBIOTIC-10 PO) Take by mouth.   Yes [provider]  sitaGLIPtin (JANUVIA) 100 MG tablet Take 1 tablet (100 mg total) by mouth daily. 06/14/18  Yes Glean Hess, MD  triamcinolone (NASACORT ALLERGY 24HR) 55 MCG/ACT AERO nasal inhaler Place 2 sprays into the nose daily.   Yes [provider]  valACYclovir (VALTREX) 1000 MG tablet Take by mouth. 08/25/16  Yes [provider]  acyclovir ointment (ZOVIRAX) 5 % Apply 1 application topically every 3 (three) hours. 08/25/16  Glean Hess, MD  doxycycline (VIBRAMYCIN) 100 MG capsule Take 1 capsule (100 mg total) by mouth 2 (two) times daily with a meal. 11/10/18   Coral Spikes, DO  mupirocin nasal ointment (BACTROBAN) 2 % Place 1 application into the nose 2 (two) times daily. 07/26/18   Glean Hess, MD   Family History Family History  Problem Relation Age of Onset  . Breast cancer Paternal Grandmother   . Colon cancer Mother   . Hypertension Mother   . Diabetes Father   . Hypertension Father   . CAD  Father   . Hypertension Sister    Social History Social History   Tobacco Use  . Smoking status: Never Smoker  . Smokeless tobacco: Never Used  Substance Use Topics  . Alcohol use: No    Alcohol/week: 0.0 standard drinks  . Drug use: No   Allergies   Povidone iodine; Atorvastatin; Meloxicam; Cephalexin; Metformin and related; and Peanut-containing drug products  Review of Systems Review of Systems  Constitutional: Positive for fever.  Skin:       Abscess.   Physical Exam Triage Vital Signs ED Triage Vitals  Enc Vitals Group     BP 11/10/18 1235 (!) 143/91     Pulse Rate 11/10/18 1235 84     Resp 11/10/18 1235 18     Temp 11/10/18 1235 98.4 F (36.9 C)     Temp Source 11/10/18 1235 Oral     SpO2 11/10/18 1235 98 %     Weight 11/10/18 1231 (!) 313 lb (142 kg)     Height 11/10/18 1231 5\' 5"  (1.651 m)     Head Circumference --      Peak Flow --      Pain Score 11/10/18 1230 5     Pain Loc --      Pain Edu? --      Excl. in [place]? --    Updated Vital Signs BP (!) 143/91 (BP Location: Left Arm)   Pulse 84   Temp 98.4 F (36.9 C) (Oral)   Resp 18   Ht 5\' 5"  (1.651 m)   Wt (!) 142 kg   SpO2 98%   BMI 52.09 kg/m   Visual Acuity Right Eye Distance:   Left Eye Distance:   Bilateral Distance:    Right Eye Near:   Left Eye Near:    Bilateral Near:     Physical Exam Vitals signs and nursing note reviewed.  Constitutional:      General: She is not in acute distress.    Appearance: Normal appearance. She is obese.  HENT:     Head: Normocephalic and atraumatic.     Nose: Nose normal.  Eyes:     General:        Right eye: No discharge.        Left eye: No discharge.     Conjunctiva/sclera: Conjunctivae normal.  Pulmonary:     Effort: Pulmonary effort is normal. No respiratory distress.  Skin:    Comments: Right buttock with an open wound with surrounding erythema.  No fluctuance at this time.  Neurological:     Mental Status: She is alert.  Psychiatric:         Mood and Affect: Mood normal.        Behavior: Behavior normal.    UC Treatments / Results  Labs (all labs ordered are listed, but only abnormal results are displayed) Labs Reviewed - No data to display  EKG  None  Radiology No results found.  Procedures Procedures (including critical care time)  Medications Ordered in UC Medications - No data to display  Initial Impression / Assessment and Plan / UC Course  I have reviewed the triage vital signs and the nursing notes.  Pertinent labs & imaging results that were available during my care of the patient were reviewed by me and considered in my medical decision making (see chart for details).    53 year old female presents with an abscess to the right buttock.  Has already ruptured and drained.  No indications for I&D at this time.  Placing on doxycycline.  Advised to continue use of Bactroban ointment.  Final Clinical Impressions(s) / UC Diagnoses   Final diagnoses:  Abscess of buttock, right   Discharge Instructions   None    ED Prescriptions    Medication Sig Dispense Auth. Provider   doxycycline (VIBRAMYCIN) 100 MG capsule Take 1 capsule (100 mg total) by mouth 2 (two) times daily with a meal. 14 capsule Coral Spikes, DO     Controlled Substance Prescriptions  Controlled Substance Registry consulted? Not Applicable   Coral Spikes, DO 11/10/18 1425

## 2018-11-10 NOTE — ED Triage Notes (Signed)
Pt c/o abscess on her right buttock. Started about 3 days ago. She reports that it ha draining some but she is going out of the country in 2 weeks and wants to make sure it is resolved by then.

## 2018-11-12 ENCOUNTER — Ambulatory Visit (INDEPENDENT_AMBULATORY_CARE_PROVIDER_SITE_OTHER): Payer: 59 | Admitting: Internal Medicine

## 2018-11-12 ENCOUNTER — Encounter: Payer: Self-pay | Admitting: Internal Medicine

## 2018-11-12 ENCOUNTER — Other Ambulatory Visit: Payer: Self-pay | Admitting: Internal Medicine

## 2018-11-12 VITALS — BP 128/80 | HR 70 | Ht 65.0 in | Wt 314.0 lb

## 2018-11-12 DIAGNOSIS — Z1231 Encounter for screening mammogram for malignant neoplasm of breast: Secondary | ICD-10-CM

## 2018-11-12 DIAGNOSIS — Z0000 Encounter for general adult medical examination without abnormal findings: Secondary | ICD-10-CM | POA: Diagnosis not present

## 2018-11-12 DIAGNOSIS — E1169 Type 2 diabetes mellitus with other specified complication: Secondary | ICD-10-CM

## 2018-11-12 DIAGNOSIS — L0231 Cutaneous abscess of buttock: Secondary | ICD-10-CM

## 2018-11-12 DIAGNOSIS — L03317 Cellulitis of buttock: Secondary | ICD-10-CM

## 2018-11-12 DIAGNOSIS — E119 Type 2 diabetes mellitus without complications: Secondary | ICD-10-CM

## 2018-11-12 DIAGNOSIS — E785 Hyperlipidemia, unspecified: Secondary | ICD-10-CM

## 2018-11-12 LAB — POCT URINALYSIS DIPSTICK
Bilirubin, UA: NEGATIVE
Blood, UA: NEGATIVE
Glucose, UA: NEGATIVE
KETONES UA: NEGATIVE
Leukocytes, UA: NEGATIVE
Nitrite, UA: NEGATIVE
PROTEIN UA: NEGATIVE
Spec Grav, UA: 1.01 (ref 1.010–1.025)
Urobilinogen, UA: 0.2 E.U./dL
pH, UA: 6.5 (ref 5.0–8.0)

## 2018-11-12 MED ORDER — SITAGLIPTIN PHOSPHATE 100 MG PO TABS: 100.0000 mg | ORAL_TABLET | Freq: Every day | ORAL | 1 refills | Status: DC

## 2018-11-12 MED ORDER — NYSTATIN 100000 UNIT/GM EX CREA: 1.0000 "application " | TOPICAL_CREAM | Freq: Two times a day (BID) | CUTANEOUS | 0 refills | Status: DC

## 2018-11-12 MED ORDER — PITAVASTATIN CALCIUM 2 MG PO TABS: 1.0000 | ORAL_TABLET | Freq: Every day | ORAL | 1 refills | Status: DC

## 2018-11-12 NOTE — Progress Notes (Signed)
Date:  11/12/2018   Name:  Mary Macias   DOB:  05/02/1966   MRN:  [id number]   Chief Complaint: Annual Exam (Breast Exam. ) [name] is a 53 y.o. female who presents today for her Complete Annual Exam. She feels fairly well. She reports exercising regularly. She reports she is sleeping fairly well. Mammogram was last done 09/2017. She no longer needs pap smear testing.  Colonoscopy was done in 2018.  Diabetes  She presents for her follow-up diabetic visit. She has type 2 diabetes mellitus. Her disease course has been stable. Pertinent negatives for hypoglycemia include no dizziness, headaches, nervousness/anxiousness or tremors. Pertinent negatives for diabetes include no chest pain, no fatigue, no polydipsia, no polyphagia and no polyuria. Symptoms are stable. Current diabetic treatment includes oral agent (monotherapy) (Januvia). She is compliant with treatment all of the time. She participates in exercise intermittently. There is no compliance with monitoring of blood glucose. An ACE inhibitor/angiotensin II receptor blocker is not being taken. Eye exam is current.  Hyperlipidemia  This is a chronic problem. The problem is controlled. Pertinent negatives include no chest pain or shortness of breath. Current antihyperlipidemic treatment includes statins. There are no compliance problems (no side effects from Livalo).   Allergic Rhinnitis - chronic sx, managed with singulair, steroid nasal spray, and allegra. Cellulitis - seen two days ago with right buttock abscess.  UC prescribed doxycycline.  I&D was not needed.  It is still draining but feeling better.  Lab Results  Component Value Date   HGBA1C 6.7 (H) 06/14/2018   Lab Results  Component Value Date   CHOL 150 01/25/2018   HDL 50 01/25/2018   LDLCALC 69 01/25/2018   TRIG 156 (H) 01/25/2018   CHOLHDL 3.0 01/25/2018   Lab Results  Component Value Date   CREATININE 1.01 (H) 06/14/2018   BUN 13 06/14/2018   NA 146  (H) 06/14/2018   K 4.7 06/14/2018   CL 103 06/14/2018   CO2 24 06/14/2018     Review of Systems  Constitutional: Negative for chills, fatigue and fever.  HENT: Negative for congestion, hearing loss, tinnitus, trouble swallowing and voice change.   Eyes: Negative for visual disturbance.  Respiratory: Negative for cough, chest tightness, shortness of breath and wheezing.   Cardiovascular: Negative for chest pain, palpitations and leg swelling.  Gastrointestinal: Negative for abdominal pain, constipation, diarrhea and vomiting.  Endocrine: Negative for polydipsia, polyphagia and polyuria.  Genitourinary: Negative for dysuria, frequency, genital sores, vaginal bleeding and vaginal discharge.  Musculoskeletal: Positive for arthralgias (knees). Negative for gait problem and joint swelling.  Skin: Positive for wound (on buttock). Negative for color change and rash.  Neurological: Negative for dizziness, tremors, light-headedness and headaches.  Hematological: Negative for adenopathy. Does not bruise/bleed easily.  Psychiatric/Behavioral: Negative for dysphoric mood and sleep disturbance. The patient is not nervous/anxious.     Patient Active Problem List   Diagnosis Date Noted  . Peripheral edema 02/08/2018  . Morbid (severe) obesity due to excess calories ([place]) 01/25/2018  . Colon polyp, hyperplastic 07/16/2017  . Type 2 diabetes mellitus without complication, without long-term current use of insulin ([place]) 08/18/2015  . Hyperlipidemia associated with type 2 diabetes mellitus ([place]) 07/08/2015  . Environmental and seasonal allergies 07/08/2015  . Avitaminosis D 07/08/2015  . Beat, premature ventricular 02/11/2015    Allergies  Allergen Reactions  . Povidone Iodine Rash    Other Reaction: Other reaction  . Atorvastatin Other (See Comments)  Severe body aches  . Meloxicam Other (See Comments)    Kidney function reduced   . Cephalexin Rash  . Metformin And Related Diarrhea  .  Peanut-Containing Drug Products Rash    Past Surgical History:  Procedure Laterality Date  . ABDOMINAL HYSTERECTOMY    . CHOLECYSTECTOMY    . COLONOSCOPY WITH PROPOFOL N/A 07/13/2017   Procedure: COLONOSCOPY WITH PROPOFOL;  Surgeon: Lollie Sails, MD;  Location: [facility] END[facility]COPY;  Service: Endoscopy;  Laterality: N/A;  . NASAL SINUS SURGERY    . TOTAL VAGINAL HYSTERECTOMY    . TUBAL LIGATION      Social History   Tobacco Use  . Smoking status: Never Smoker  . Smokeless tobacco: Never Used  Substance Use Topics  . Alcohol use: No    Alcohol/week: 0.0 standard drinks  . Drug use: No     Medication list has been reviewed and updated.  Current Meds  Medication Sig  . acetaminophen (TYLENOL) 650 MG CR tablet Take 650 mg by mouth every 8 (eight) hours as needed for pain.  Marland Kitchen acyclovir ointment (ZOVIRAX) 5 % Apply 1 application topically every 3 (three) hours.  . Cholecalciferol (VITAMIN D3) 5000 units TABS Take 1 tablet by mouth daily.  Marland Kitchen doxycycline (VIBRAMYCIN) 100 MG capsule Take 1 capsule (100 mg total) by mouth 2 (two) times daily with a meal.  . fexofenadine (ALLEGRA) 180 MG tablet Take 180 mg by mouth daily.  . Misc Natural Products (OSTEO BI-FLEX/5-LOXIN ADVANCED PO) Take by mouth.  . mupirocin nasal ointment (BACTROBAN) 2 % Place 1 application into the nose 2 (two) times daily.  . Pitavastatin Calcium (LIVALO) 2 MG TABS Take 1 tablet by mouth daily. Pt given samples  . Probiotic Product (PROBIOTIC-10 PO) Take by mouth.  . sitaGLIPtin (JANUVIA) 100 MG tablet Take 1 tablet (100 mg total) by mouth daily.  Marland Kitchen triamcinolone (NASACORT ALLERGY 24HR) 55 MCG/ACT AERO nasal inhaler Place 2 sprays into the nose daily.  . valACYclovir (VALTREX) 1000 MG tablet Take by mouth.    PHQ 2/9 Scores 11/12/2018 07/26/2018 07/26/2017 12/03/2015  PHQ - 2 Score 0 0 0 0  PHQ- 9 Score 1 - - -   Wt Readings from Last 3 Encounters:  11/12/18 (!) 314 lb (142.4 kg)  11/10/18 (!) 313 lb (142 kg)    07/26/18 (!) 311 lb (141.1 kg)    Physical Exam Vitals signs and nursing note reviewed.  Constitutional:      General: She is not in acute distress.    Appearance: She is well-developed. She is obese.  HENT:     Head: Normocephalic and atraumatic.     Right Ear: Tympanic membrane and ear canal normal.     Left Ear: Tympanic membrane and ear canal normal.     Nose:     Right Sinus: No maxillary sinus tenderness.     Left Sinus: No maxillary sinus tenderness.     Mouth/Throat:     Pharynx: Uvula midline.  Eyes:     General: No scleral icterus.       Right eye: No discharge.        Left eye: No discharge.     Conjunctiva/sclera: Conjunctivae normal.  Neck:     Musculoskeletal: Normal range of motion. No erythema.     Thyroid: No thyromegaly.     Vascular: No carotid bruit.  Cardiovascular:     Rate and Rhythm: Normal rate and regular rhythm.     Pulses: Normal pulses.  Heart sounds: Normal heart sounds.  Pulmonary:     Effort: Pulmonary effort is normal. No respiratory distress.     Breath sounds: No wheezing.  Chest:     Breasts:        Right: No mass, nipple discharge, skin change or tenderness.        Left: No mass, nipple discharge, skin change or tenderness.  Abdominal:     General: Bowel sounds are normal.     Palpations: Abdomen is soft.     Tenderness: There is no abdominal tenderness.  Musculoskeletal: Normal range of motion.  Lymphadenopathy:     Cervical: No cervical adenopathy.  Skin:    General: Skin is warm and dry.     Findings: No rash.     Comments: Buttock lesion not examined per pt request  Neurological:     Mental Status: She is alert and oriented to person, place, and time.     Cranial Nerves: No cranial nerve deficit.     Sensory: No sensory deficit.     Deep Tendon Reflexes: Reflexes are normal and symmetric.  Psychiatric:        Speech: Speech normal.        Behavior: Behavior normal.        Thought Content: Thought content normal.      BP 128/80   Pulse 70   Ht 5\' 5"  (1.651 m)   Wt (!) 314 lb (142.4 kg)   SpO2 97%   BMI 52.25 kg/m   Assessment and Plan: 1. Annual physical exam Normal exam except for weight Increase exercise, add diet changes - pt is committed to losing weight and will follow up at next visit  2. Encounter for screening mammogram for breast cancer To schedule at [place]; Future  3. Type 2 diabetes mellitus without complication, without long-term current use of insulin ([place]) Continue current therapy May need to add medication if not improved - probably SGLT-2 or oral GLP-1 - Comprehensive metabolic panel - Hemoglobin A1c - TSH - Microalbumin / creatinine urine ratio - POCT urinalysis dipstick - sitaGLIPtin (JANUVIA) 100 MG tablet; Take 1 tablet (100 mg total) by mouth daily.  Dispense: 90 tablet; Refill: 1  4. Hyperlipidemia associated with type 2 diabetes mellitus (HCC) Tolerated 2 weeks of Livalo - will send Rx - Lipid panel - Pitavastatin Calcium (LIVALO) 2 MG TABS; Take 1 tablet (2 mg total) by mouth daily. Pt given samples  Dispense: 90 tablet; Refill: 1  5. Cellulitis and abscess of buttock Continue doxycycline, local care Return if not improving - CBC with Differential/Platelet   Partially dictated using Editor, commissioning. Any errors are unintentional.  Halina Maidens, MD [facility]  11/12/2018

## 2018-11-13 LAB — CBC WITH DIFFERENTIAL/PLATELET
BASOS: 0 %
Basophils Absolute: 0 10*3/uL (ref 0.0–0.2)
EOS (ABSOLUTE): 0.3 10*3/uL (ref 0.0–0.4)
EOS: 3 %
HEMATOCRIT: 41.8 % (ref 34.0–46.6)
Hemoglobin: 14.1 g/dL (ref 11.1–15.9)
Immature Grans (Abs): 0 10*3/uL (ref 0.0–0.1)
Immature Granulocytes: 0 %
LYMPHS ABS: 2.1 10*3/uL (ref 0.7–3.1)
Lymphs: 21 %
MCH: 30.4 pg (ref 26.6–33.0)
MCHC: 33.7 g/dL (ref 31.5–35.7)
MCV: 90 fL (ref 79–97)
MONOS ABS: 0.6 10*3/uL (ref 0.1–0.9)
Monocytes: 6 %
Neutrophils Absolute: 6.7 10*3/uL (ref 1.4–7.0)
Neutrophils: 70 %
Platelets: 329 10*3/uL (ref 150–450)
RBC: 4.64 x10E6/uL (ref 3.77–5.28)
RDW: 12.9 % (ref 11.7–15.4)
WBC: 9.8 10*3/uL (ref 3.4–10.8)

## 2018-11-13 LAB — COMPREHENSIVE METABOLIC PANEL
ALBUMIN: 4.3 g/dL (ref 3.8–4.9)
ALT: 21 IU/L (ref 0–32)
AST: 21 IU/L (ref 0–40)
Albumin/Globulin Ratio: 1.5 (ref 1.2–2.2)
Alkaline Phosphatase: 66 IU/L (ref 39–117)
BUN/Creatinine Ratio: 16 (ref 9–23)
BUN: 14 mg/dL (ref 6–24)
Bilirubin Total: 0.3 mg/dL (ref 0.0–1.2)
CO2: 23 mmol/L (ref 20–29)
Calcium: 9.2 mg/dL (ref 8.7–10.2)
Chloride: 100 mmol/L (ref 96–106)
Creatinine, Ser: 0.89 mg/dL (ref 0.57–1.00)
GFR calc Af Amer: 85 mL/min/{1.73_m2} (ref >59)
GFR calc non Af Amer: 74 mL/min/{1.73_m2} (ref >59)
Globulin, Total: 2.9 g/dL (ref 1.5–4.5)
Glucose: 130 mg/dL — ABNORMAL HIGH (ref 65–99)
POTASSIUM: 4.5 mmol/L (ref 3.5–5.2)
Sodium: 141 mmol/L (ref 134–144)
Total Protein: 7.2 g/dL (ref 6.0–8.5)

## 2018-11-13 LAB — LIPID PANEL
CHOL/HDL RATIO: 3.2 ratio (ref 0.0–4.4)
Cholesterol, Total: 152 mg/dL (ref 100–199)
HDL: 48 mg/dL (ref >39)
LDL Calculated: 79 mg/dL (ref 0–99)
Triglycerides: 123 mg/dL (ref 0–149)
VLDL Cholesterol Cal: 25 mg/dL (ref 5–40)

## 2018-11-13 LAB — MICROALBUMIN / CREATININE URINE RATIO
Creatinine, Urine: 118.7 mg/dL
MICROALBUM., U, RANDOM: 29 ug/mL
Microalb/Creat Ratio: 24 mg/g creat (ref 0–29)

## 2018-11-13 LAB — HEMOGLOBIN A1C
Est. average glucose Bld gHb Est-mCnc: 154 mg/dL
Hgb A1c MFr Bld: 7 % — ABNORMAL HIGH (ref 4.8–5.6)

## 2018-11-13 LAB — TSH: TSH: 2.5 u[IU]/mL (ref 0.450–4.500)

## 2018-11-26 ENCOUNTER — Ambulatory Visit
Admission: RE | Admit: 2018-11-26 | Discharge: 2018-11-26 | Disposition: A | Discharge status: Home / Self Care | Payer: 59 | Source: Ambulatory Visit | Attending: Internal Medicine | Admitting: Internal Medicine

## 2018-11-26 DIAGNOSIS — Z1231 Encounter for screening mammogram for malignant neoplasm of breast: Secondary | ICD-10-CM | POA: Diagnosis present

## 2019-01-25 ENCOUNTER — Other Ambulatory Visit: Payer: Self-pay | Admitting: Internal Medicine

## 2019-01-25 DIAGNOSIS — E119 Type 2 diabetes mellitus without complications: Secondary | ICD-10-CM

## 2019-03-04 ENCOUNTER — Ambulatory Visit (INDEPENDENT_AMBULATORY_CARE_PROVIDER_SITE_OTHER): Payer: 59 | Admitting: Internal Medicine

## 2019-03-04 ENCOUNTER — Encounter: Payer: Self-pay | Admitting: Internal Medicine

## 2019-03-04 ENCOUNTER — Other Ambulatory Visit: Payer: Self-pay

## 2019-03-04 VITALS — BP 138/90 | HR 66 | Ht 65.0 in | Wt 313.0 lb

## 2019-03-04 DIAGNOSIS — I493 Ventricular premature depolarization: Secondary | ICD-10-CM | POA: Diagnosis not present

## 2019-03-04 DIAGNOSIS — E1169 Type 2 diabetes mellitus with other specified complication: Secondary | ICD-10-CM | POA: Diagnosis not present

## 2019-03-04 DIAGNOSIS — R609 Edema, unspecified: Secondary | ICD-10-CM | POA: Diagnosis not present

## 2019-03-04 DIAGNOSIS — E785 Hyperlipidemia, unspecified: Secondary | ICD-10-CM

## 2019-03-04 LAB — POCT URINALYSIS DIPSTICK
Bilirubin, UA: NEGATIVE
Blood, UA: NEGATIVE
Glucose, UA: NEGATIVE
Ketones, UA: NEGATIVE
Leukocytes, UA: NEGATIVE
Nitrite, UA: NEGATIVE
Protein, UA: NEGATIVE
Spec Grav, UA: 1.01 (ref 1.010–1.025)
Urobilinogen, UA: 0.2 E.U./dL
pH, UA: 6.5 (ref 5.0–8.0)

## 2019-03-04 NOTE — Progress Notes (Signed)
Date:  03/04/2019   Name:  Mary Macias   DOB:  04/23/1966   MRN:  [id number]   Chief Complaint: Back Pain (In the morning she wakes up in right side of back and flank. Low uriune output. 4-5 days late last week with only 2 cups daily. ) and Edema (In feet and hands. Concerned all related to livalo. Also can feel she is having PVCs. )  Flank Pain  This is a new problem. The pain is present in the thoracic spine. The quality of the pain is described as aching. Pertinent negatives include no abdominal pain, chest pain, dysuria or fever. (A sense that she is not producing as much urine as usual )  Palpitations   This is a recurrent problem. Progression since onset: worse over the past 2 weeks. Pertinent negatives include no chest pain, fever or shortness of breath. She has tried nothing for the symptoms.  Edema - worse over the past 6 weeks since starting Livalo.  She wonders if this could be the cause.  She has tried to drink more water and leg/ankle edema is much improved.   Review of Systems  Constitutional: Negative for chills, fatigue, fever and unexpected weight change.  Respiratory: Negative for shortness of breath and wheezing.   Cardiovascular: Positive for palpitations and leg swelling. Negative for chest pain.  Gastrointestinal: Negative for abdominal pain, blood in stool, constipation and diarrhea.  Genitourinary: Positive for flank pain. Negative for dysuria, hematuria and urgency.    Patient Active Problem List   Diagnosis Date Noted  . Peripheral edema 02/08/2018  . Morbid (severe) obesity due to excess calories ([place]) 01/25/2018  . Colon polyp, hyperplastic 07/16/2017  . Type 2 diabetes mellitus without complication, without long-term current use of insulin ([place]) 08/18/2015  . Hyperlipidemia associated with type 2 diabetes mellitus ([place]) 07/08/2015  . Environmental and seasonal allergies 07/08/2015  . Avitaminosis D 07/08/2015  . Beat, premature ventricular  02/11/2015    Allergies  Allergen Reactions  . Povidone Iodine Rash    Other Reaction: Other reaction  . Atorvastatin Other (See Comments)    Severe body aches  . Meloxicam Other (See Comments)    Kidney function reduced   . Cephalexin Rash  . Metformin And Related Diarrhea  . Peanut-Containing Drug Products Rash    Past Surgical History:  Procedure Laterality Date  . ABDOMINAL HYSTERECTOMY    . CHOLECYSTECTOMY    . COLONOSCOPY WITH PROPOFOL N/A 07/13/2017   Procedure: COLONOSCOPY WITH PROPOFOL;  Surgeon: Lollie Sails, MD;  Location: [facility] ENDOSCOPY;  Service: Endoscopy;  Laterality: N/A;  . NASAL SINUS SURGERY    . OOPHORECTOMY    . TOTAL VAGINAL HYSTERECTOMY    . TUBAL LIGATION      Social History   Tobacco Use  . Smoking status: Never Smoker  . Smokeless tobacco: Never Used  Substance Use Topics  . Alcohol use: No    Alcohol/week: 0.0 standard drinks  . Drug use: No     Medication list has been reviewed and updated.  Current Meds  Medication Sig  . acetaminophen (TYLENOL) 650 MG CR tablet Take 650 mg by mouth every 8 (eight) hours as needed for pain.  Marland Kitchen acyclovir ointment (ZOVIRAX) 5 % Apply 1 application topically every 3 (three) hours.  . Cholecalciferol (VITAMIN D3) 5000 units TABS Take 1 tablet by mouth daily.  . fexofenadine (ALLEGRA) 180 MG tablet Take 180 mg by mouth daily.  Marland Kitchen JANUVIA 100 MG tablet  TAKE 1 TABLET BY MOUTH EVERY DAY  . Misc Natural Products (OSTEO BI-FLEX/5-LOXIN ADVANCED PO) Take by mouth.  . mupirocin nasal ointment (BACTROBAN) 2 % Place 1 application into the nose 2 (two) times daily.  Marland Kitchen nystatin cream (MYCOSTATIN) Apply 1 application topically 2 (two) times daily.  . Pitavastatin Calcium (LIVALO) 2 MG TABS Take 1 tablet (2 mg total) by mouth daily. Pt given samples  . Probiotic Product (PROBIOTIC-10 PO) Take by mouth.  . triamcinolone (NASACORT ALLERGY 24HR) 55 MCG/ACT AERO nasal inhaler Place 2 sprays into the nose daily.  .  valACYclovir (VALTREX) 1000 MG tablet Take by mouth.    PHQ 2/9 Scores 11/12/2018 07/26/2018 07/26/2017 12/03/2015  PHQ - 2 Score 0 0 0 0  PHQ- 9 Score 1 - - -    BP Readings from Last 3 Encounters:  03/04/19 138/90  11/12/18 128/80  11/10/18 (!) 143/91    Physical Exam Vitals signs and nursing note reviewed.  Constitutional:      General: She is not in acute distress.    Appearance: She is well-developed.  HENT:     Head: Normocephalic and atraumatic.  Neck:     Musculoskeletal: Normal range of motion and neck supple.  Cardiovascular:     Rate and Rhythm: Normal rate and regular rhythm.  No extrasystoles are present.    Pulses: Normal pulses.     Heart sounds: Normal heart sounds.  Pulmonary:     Effort: Pulmonary effort is normal. No respiratory distress.     Breath sounds: Normal breath sounds. No wheezing or rhonchi.  Abdominal:     General: There is no distension.     Palpations: There is no mass.  Musculoskeletal: Normal range of motion.     Right lower leg: 1+ Edema present.     Left lower leg: 1+ Edema present.  Skin:    General: Skin is warm and dry.     Findings: No rash.  Neurological:     Mental Status: She is alert and oriented to person, place, and time.  Psychiatric:        Behavior: Behavior normal.        Thought Content: Thought content normal.     Wt Readings from Last 3 Encounters:  03/04/19 (!) 313 lb (142 kg)  11/12/18 (!) 314 lb (142.4 kg)  11/10/18 (!) 313 lb (142 kg)    BP 138/90   Pulse 66   Ht 5\' 5"  (1.651 m)   Wt (!) 313 lb (142 kg)   SpO2 97%   BMI 52.09 kg/m   Assessment and Plan: 1. Peripheral edema Continue to drink water, avoid sodium, elevate Would not prescribe diuretic which might worsen electrolyte abnormalities - Comprehensive metabolic panel - POCT urinalysis dipstick - UA negative  2. Beat, premature ventricular Moderately sx but much improved with none on exam today Continue to monitor sx Rule out electrolyte  abnormality - Comprehensive metabolic panel  3. Hyperlipidemia associated with type 2 diabetes mellitus ([place]) Stop Livalo for now Will discuss options such as low dose weekly at next visit   Partially dictated using Editor, commissioning. Any errors are unintentional.  Halina Maidens, MD [facility]  03/04/2019

## 2019-03-05 LAB — COMPREHENSIVE METABOLIC PANEL
ALT: 36 IU/L — ABNORMAL HIGH (ref 0–32)
AST: 33 IU/L (ref 0–40)
Albumin/Globulin Ratio: 1.8 (ref 1.2–2.2)
Albumin: 4.4 g/dL (ref 3.8–4.9)
Alkaline Phosphatase: 61 IU/L (ref 39–117)
BUN/Creatinine Ratio: 11 (ref 9–23)
BUN: 12 mg/dL (ref 6–24)
Bilirubin Total: 0.4 mg/dL (ref 0.0–1.2)
CO2: 25 mmol/L (ref 20–29)
Calcium: 9.4 mg/dL (ref 8.7–10.2)
Chloride: 100 mmol/L (ref 96–106)
Creatinine, Ser: 1.09 mg/dL — ABNORMAL HIGH (ref 0.57–1.00)
GFR calc Af Amer: 67 mL/min/{1.73_m2} (ref >59)
GFR calc non Af Amer: 58 mL/min/{1.73_m2} — ABNORMAL LOW (ref >59)
Globulin, Total: 2.4 g/dL (ref 1.5–4.5)
Glucose: 111 mg/dL — ABNORMAL HIGH (ref 65–99)
Potassium: 4.1 mmol/L (ref 3.5–5.2)
Sodium: 142 mmol/L (ref 134–144)
Total Protein: 6.8 g/dL (ref 6.0–8.5)

## 2019-03-14 ENCOUNTER — Ambulatory Visit: Payer: 59 | Admitting: Internal Medicine

## 2019-04-07 ENCOUNTER — Ambulatory Visit (INDEPENDENT_AMBULATORY_CARE_PROVIDER_SITE_OTHER): Payer: 59 | Admitting: Internal Medicine

## 2019-04-07 ENCOUNTER — Encounter: Payer: Self-pay | Admitting: Internal Medicine

## 2019-04-07 ENCOUNTER — Other Ambulatory Visit: Payer: Self-pay

## 2019-04-07 VITALS — BP 136/80 | HR 66 | Ht 65.0 in | Wt 314.0 lb

## 2019-04-07 DIAGNOSIS — E785 Hyperlipidemia, unspecified: Secondary | ICD-10-CM

## 2019-04-07 DIAGNOSIS — R609 Edema, unspecified: Secondary | ICD-10-CM

## 2019-04-07 DIAGNOSIS — E118 Type 2 diabetes mellitus with unspecified complications: Secondary | ICD-10-CM

## 2019-04-07 DIAGNOSIS — I493 Ventricular premature depolarization: Secondary | ICD-10-CM | POA: Diagnosis not present

## 2019-04-07 DIAGNOSIS — E1169 Type 2 diabetes mellitus with other specified complication: Secondary | ICD-10-CM

## 2019-04-07 NOTE — Progress Notes (Signed)
Date:  04/07/2019   Name:  Mary Macias   DOB:  [birth year]   MRN:  [id number]   Chief Complaint: Diabetes (4 month follow up.)  Diabetes She presents for her follow-up diabetic visit. She has type 2 diabetes mellitus. Pertinent negatives for hypoglycemia include no dizziness, headaches or tremors. Pertinent negatives for diabetes include no chest pain, no fatigue, no polydipsia and no polyuria. An ACE inhibitor/angiotensin II receptor blocker is being taken.  Hyperlipidemia This is a chronic problem. Pertinent negatives include no chest pain or shortness of breath. Compliance problems include medication side effects (edema and flank pain on livalo).   PVCs - she has been having more of these so went to cardiology and is now wearing a monitor for 2 weeks. Edema - has been better since stopping Livalo.  Still slightly worse at the end of the day.   Lab Results  Component Value Date   HGBA1C 7.0 (H) 11/12/2018   Lab Results  Component Value Date   CREATININE 1.09 (H) 03/04/2019   BUN 12 03/04/2019   NA 142 03/04/2019   K 4.1 03/04/2019   CL 100 03/04/2019   CO2 25 03/04/2019   Lab Results  Component Value Date   CHOL 152 11/12/2018   HDL 48 11/12/2018   LDLCALC 79 11/12/2018   TRIG 123 11/12/2018   CHOLHDL 3.2 11/12/2018     Review of Systems  Constitutional: Negative for appetite change, fatigue, fever and unexpected weight change.  HENT: Negative for tinnitus and trouble swallowing.   Eyes: Negative for visual disturbance.  Respiratory: Negative for cough, chest tightness and shortness of breath.   Cardiovascular: Positive for palpitations and leg swelling. Negative for chest pain.  Gastrointestinal: Negative for abdominal pain.  Endocrine: Negative for polydipsia and polyuria.  Genitourinary: Negative for dysuria and hematuria.  Musculoskeletal: Negative for arthralgias.  Skin: Negative for wound.  Neurological: Negative for dizziness, tremors, numbness and  headaches.  Psychiatric/Behavioral: Negative for dysphoric mood and sleep disturbance.    Patient Active Problem List   Diagnosis Date Noted  . Peripheral edema 02/08/2018  . Morbid (severe) obesity due to excess calories ([place]) 01/25/2018  . Colon polyp, hyperplastic 07/16/2017  . Type II diabetes mellitus with complication ([place]) 84/53/6468  . Hyperlipidemia associated with type 2 diabetes mellitus ([place]) 07/08/2015  . Environmental and seasonal allergies 07/08/2015  . Avitaminosis D 07/08/2015  . Beat, premature ventricular 02/11/2015    Allergies  Allergen Reactions  . Povidone Iodine Rash    Other Reaction: Other reaction  . Atorvastatin Other (See Comments)    Severe body aches  . Meloxicam Other (See Comments)    Kidney function reduced   . Cephalexin Rash  . Metformin And Related Diarrhea  . Peanut-Containing Drug Products Rash    Past Surgical History:  Procedure Laterality Date  . ABDOMINAL HYSTERECTOMY    . CHOLECYSTECTOMY    . COLONOSCOPY WITH PROPOFOL N/A 07/13/2017   Procedure: COLONOSCOPY WITH PROPOFOL;  Surgeon: Lollie Sails, MD;  Location: [facility] ENDOSCOPY;  Service: Endoscopy;  Laterality: N/A;  . NASAL SINUS SURGERY    . OOPHORECTOMY    . TOTAL VAGINAL HYSTERECTOMY    . TUBAL LIGATION      Social History   Tobacco Use  . Smoking status: Never Smoker  . Smokeless tobacco: Never Used  Substance Use Topics  . Alcohol use: No    Alcohol/week: 0.0 standard drinks  . Drug use: No     Medication list  has been reviewed and updated.  Current Meds  Medication Sig  . acetaminophen (TYLENOL) 650 MG CR tablet Take 650 mg by mouth every 8 (eight) hours as needed for pain.  Marland Kitchen acyclovir ointment (ZOVIRAX) 5 % Apply 1 application topically every 3 (three) hours.  . fexofenadine (ALLEGRA) 180 MG tablet Take 180 mg by mouth daily.  Marland Kitchen JANUVIA 100 MG tablet TAKE 1 TABLET BY MOUTH EVERY DAY  . mupirocin nasal ointment (BACTROBAN) 2 % Place 1 application into  the nose 2 (two) times daily.  Marland Kitchen nystatin cream (MYCOSTATIN) Apply 1 application topically 2 (two) times daily.  . Probiotic Product (PROBIOTIC-10 PO) Take by mouth.  . triamcinolone (NASACORT ALLERGY 24HR) 55 MCG/ACT AERO nasal inhaler Place 2 sprays into the nose daily.  . valACYclovir (VALTREX) 1000 MG tablet Take by mouth.    PHQ 2/9 Scores 04/07/2019 11/12/2018 07/26/2018 07/26/2017  PHQ - 2 Score 0 0 0 0  PHQ- 9 Score 0 1 - -    BP Readings from Last 3 Encounters:  04/07/19 136/80  03/04/19 138/90  11/12/18 128/80    Physical Exam Vitals signs and nursing note reviewed.  Constitutional:      General: She is not in acute distress.    Appearance: She is well-developed.  HENT:     Head: Normocephalic and atraumatic.  Eyes:     Pupils: Pupils are equal, round, and reactive to light.  Neck:     Musculoskeletal: Normal range of motion.  Cardiovascular:     Rate and Rhythm: Normal rate and regular rhythm.  No extrasystoles are present.    Pulses:          Dorsalis pedis pulses are 2+ on the right side and 2+ on the left side.       Posterior tibial pulses are 2+ on the right side and 2+ on the left side.     Heart sounds: No murmur.  Pulmonary:     Effort: Pulmonary effort is normal. No respiratory distress.     Breath sounds: Normal breath sounds. No wheezing or rhonchi.  Chest:    Musculoskeletal: Normal range of motion.     Right lower leg: No edema.     Left lower leg: No edema.  Lymphadenopathy:     Cervical: No cervical adenopathy.  Skin:    General: Skin is warm and dry.     Findings: No rash.  Neurological:     Mental Status: She is alert and oriented to person, place, and time.  Psychiatric:        Attention and Perception: Attention normal.        Mood and Affect: Mood normal.        Behavior: Behavior normal.        Thought Content: Thought content normal.     Wt Readings from Last 3 Encounters:  04/07/19 (!) 314 lb (142.4 kg)  03/04/19 (!) 313 lb  (142 kg)  11/12/18 (!) 314 lb (142.4 kg)    BP 136/80 (BP Location: Right Wrist, Patient Position: Sitting, Cuff Size: Large)   Pulse 66   Ht 5\' 5"  (1.651 m)   Wt (!) 314 lb (142.4 kg)   SpO2 98%   BMI 52.25 kg/m   Assessment and Plan: 1. Type II diabetes mellitus with complication (HCC) Continue Januvia Recheck renal function - Hemoglobin I7T - Basic metabolic panel  2. Hyperlipidemia associated with type 2 diabetes mellitus ([place]) Discussed low dose statin twice a week as a trial -  such as simvastin 10 mg - Lipid panel  3. Peripheral edema Improved, check renal function  4. Beat, premature ventricular Follow up with Cardiology for monitor results - Magnesium   Partially dictated using [place]. Any errors are unintentional.  Halina Maidens, MD [facility]  04/07/2019

## 2019-04-08 ENCOUNTER — Other Ambulatory Visit: Payer: Self-pay | Admitting: Internal Medicine

## 2019-04-08 DIAGNOSIS — E1169 Type 2 diabetes mellitus with other specified complication: Secondary | ICD-10-CM

## 2019-04-08 DIAGNOSIS — E785 Hyperlipidemia, unspecified: Secondary | ICD-10-CM

## 2019-04-08 LAB — LIPID PANEL
Chol/HDL Ratio: 4.3 ratio (ref 0.0–4.4)
Cholesterol, Total: 175 mg/dL (ref 100–199)
HDL: 41 mg/dL (ref >39)
LDL Calculated: 87 mg/dL (ref 0–99)
Triglycerides: 235 mg/dL — ABNORMAL HIGH (ref 0–149)
VLDL Cholesterol Cal: 47 mg/dL — ABNORMAL HIGH (ref 5–40)

## 2019-04-08 LAB — BASIC METABOLIC PANEL
BUN/Creatinine Ratio: 11 (ref 9–23)
BUN: 12 mg/dL (ref 6–24)
CO2: 21 mmol/L (ref 20–29)
Calcium: 8.9 mg/dL (ref 8.7–10.2)
Chloride: 101 mmol/L (ref 96–106)
Creatinine, Ser: 1.06 mg/dL — ABNORMAL HIGH (ref 0.57–1.00)
GFR calc Af Amer: 69 mL/min/{1.73_m2} (ref >59)
GFR calc non Af Amer: 60 mL/min/{1.73_m2} (ref >59)
Glucose: 140 mg/dL — ABNORMAL HIGH (ref 65–99)
Potassium: 4.1 mmol/L (ref 3.5–5.2)
Sodium: 139 mmol/L (ref 134–144)

## 2019-04-08 LAB — HEMOGLOBIN A1C
Est. average glucose Bld gHb Est-mCnc: 157 mg/dL
Hgb A1c MFr Bld: 7.1 % — ABNORMAL HIGH (ref 4.8–5.6)

## 2019-04-08 LAB — MAGNESIUM: Magnesium: 2.1 mg/dL (ref 1.6–2.3)

## 2019-04-08 MED ORDER — SIMVASTATIN 10 MG PO TABS: 10.0000 mg | ORAL_TABLET | Freq: Every day | ORAL | 0 refills | Status: DC

## 2019-04-08 NOTE — Progress Notes (Signed)
Patient informed of labs. DM good. Cholesterol meds needed. She said due to going on vacations, she does not want to try new medications but will try this medicine at her 4 month follow up.

## 2019-05-03 ENCOUNTER — Other Ambulatory Visit: Payer: Self-pay | Admitting: Internal Medicine

## 2019-05-03 DIAGNOSIS — R059 Cough, unspecified: Secondary | ICD-10-CM

## 2019-05-03 DIAGNOSIS — R05 Cough: Secondary | ICD-10-CM

## 2019-08-07 ENCOUNTER — Ambulatory Visit (INDEPENDENT_AMBULATORY_CARE_PROVIDER_SITE_OTHER): Payer: 59 | Admitting: Internal Medicine

## 2019-08-07 ENCOUNTER — Encounter: Payer: Self-pay | Admitting: Internal Medicine

## 2019-08-07 ENCOUNTER — Other Ambulatory Visit: Payer: Self-pay

## 2019-08-07 VITALS — BP 120/84 | HR 60 | Temp 97.0°F | Ht 65.0 in | Wt 306.0 lb

## 2019-08-07 DIAGNOSIS — F418 Other specified anxiety disorders: Secondary | ICD-10-CM | POA: Diagnosis not present

## 2019-08-07 DIAGNOSIS — Z23 Encounter for immunization: Secondary | ICD-10-CM

## 2019-08-07 DIAGNOSIS — E785 Hyperlipidemia, unspecified: Secondary | ICD-10-CM

## 2019-08-07 DIAGNOSIS — E118 Type 2 diabetes mellitus with unspecified complications: Secondary | ICD-10-CM

## 2019-08-07 DIAGNOSIS — E1169 Type 2 diabetes mellitus with other specified complication: Secondary | ICD-10-CM

## 2019-08-07 MED ORDER — ESCITALOPRAM OXALATE 10 MG PO TABS: 10.0000 mg | ORAL_TABLET | Freq: Every day | ORAL | 1 refills | Status: DC

## 2019-08-07 MED ORDER — SITAGLIPTIN PHOSPHATE 100 MG PO TABS: 100.0000 mg | ORAL_TABLET | Freq: Every day | ORAL | 3 refills | Status: DC

## 2019-08-07 NOTE — Progress Notes (Signed)
Date:  08/07/2019   Name:  Mary Macias   DOB:  1965-10-20   MRN:  [id number]   Chief Complaint: Diabetes (4 month follow up. ), Hypertension, and Anxiety (16-GAD7, [id number])  Diabetes She presents for her follow-up diabetic visit. She has type 2 diabetes mellitus. Her disease course has been stable. Hypoglycemia symptoms include nervousness/anxiousness. Pertinent negatives for hypoglycemia include no headaches or tremors. Pertinent negatives for diabetes include no chest pain, no fatigue, no polydipsia and no polyuria. Current diabetic treatment includes oral agent (monotherapy) Celesta Gentile). She is compliant with treatment all of the time. An ACE inhibitor/angiotensin II receptor blocker is not being taken. Eye exam is not current.  Hyperlipidemia This is a chronic problem. The problem is uncontrolled. Pertinent negatives include no chest pain or shortness of breath. Current antihyperlipidemic treatment includes statins (started pravastatin 40 mg one month ago).  Anxiety Presents for follow-up visit. Symptoms include decreased concentration, depressed mood, excessive worry, insomnia, nausea, nervous/anxious behavior and palpitations. Patient reports no chest pain, feeling of choking, shortness of breath or suicidal ideas. Symptoms occur most days. Duration: pt has been seeing a counselor for about one year - The severity of symptoms is interfering with daily activities. The quality of sleep is fair.     Lab Results  Component Value Date   HGBA1C 7.1 (H) 04/07/2019   Lab Results  Component Value Date   CHOL 175 04/07/2019   HDL 41 04/07/2019   LDLCALC 87 04/07/2019   TRIG 235 (H) 04/07/2019   CHOLHDL 4.3 04/07/2019   Lab Results  Component Value Date   CREATININE 1.06 (H) 04/07/2019   BUN 12 04/07/2019   NA 139 04/07/2019   K 4.1 04/07/2019   CL 101 04/07/2019   CO2 21 04/07/2019    Review of Systems  Constitutional: Negative for appetite change, fatigue, fever and  unexpected weight change.  HENT: Negative for tinnitus and trouble swallowing.   Eyes: Negative for visual disturbance.  Respiratory: Negative for cough, chest tightness and shortness of breath.   Cardiovascular: Positive for palpitations. Negative for chest pain and leg swelling.  Gastrointestinal: Positive for nausea. Negative for abdominal pain.  Endocrine: Negative for polydipsia and polyuria.  Genitourinary: Negative for dysuria and hematuria.  Musculoskeletal: Negative for arthralgias.  Neurological: Negative for tremors, numbness and headaches.  Psychiatric/Behavioral: Positive for decreased concentration and sleep disturbance. Negative for dysphoric mood and suicidal ideas. The patient is nervous/anxious and has insomnia.     Patient Active Problem List   Diagnosis Date Noted  . Peripheral edema 02/08/2018  . Morbid (severe) obesity due to excess calories ([place]) 01/25/2018  . Colon polyp, hyperplastic 07/16/2017  . Type II diabetes mellitus with complication ([place]) 99991111  . Hyperlipidemia associated with type 2 diabetes mellitus ([place]) 07/08/2015  . Environmental and seasonal allergies 07/08/2015  . Avitaminosis D 07/08/2015  . Beat, premature ventricular 02/11/2015    Allergies  Allergen Reactions  . Povidone Iodine Rash    Other Reaction: Other reaction  . Atorvastatin Other (See Comments)    Severe body aches  . Meloxicam Other (See Comments)    Kidney function reduced   . Cephalexin Rash  . Metformin And Related Diarrhea  . Peanut-Containing Drug Products Rash    Past Surgical History:  Procedure Laterality Date  . ABDOMINAL HYSTERECTOMY    . CHOLECYSTECTOMY    . COLONOSCOPY WITH PROPOFOL N/A 07/13/2017   Procedure: COLONOSCOPY WITH PROPOFOL;  Surgeon: Lollie Sails, MD;  Location: [facility] ENDOSCOPY;  Service: Endoscopy;  Laterality: N/A;  . NASAL SINUS SURGERY    . OOPHORECTOMY    . TOTAL VAGINAL HYSTERECTOMY    . TUBAL LIGATION      Social History    Tobacco Use  . Smoking status: Never Smoker  . Smokeless tobacco: Never Used  Substance Use Topics  . Alcohol use: No    Alcohol/week: 0.0 standard drinks  . Drug use: No     Medication list has been reviewed and updated.  Current Meds  Medication Sig  . acetaminophen (TYLENOL) 650 MG CR tablet Take 650 mg by mouth every 8 (eight) hours as needed for pain.  Marland Kitchen acyclovir ointment (ZOVIRAX) 5 % Apply 1 application topically every 3 (three) hours.  . fexofenadine (ALLEGRA) 180 MG tablet Take 180 mg by mouth daily.  Marland Kitchen JANUVIA 100 MG tablet TAKE 1 TABLET BY MOUTH EVERY DAY  . mupirocin nasal ointment (BACTROBAN) 2 % Place 1 application into the nose 2 (two) times daily.  Marland Kitchen nystatin cream (MYCOSTATIN) Apply 1 application topically 2 (two) times daily.  . pravastatin (PRAVACHOL) 40 MG tablet Take 40 mg by mouth daily.  . Probiotic Product (PROBIOTIC-10 PO) Take by mouth.  . triamcinolone (NASACORT ALLERGY 24HR) 55 MCG/ACT AERO nasal inhaler Place 2 sprays into the nose daily.  . valACYclovir (VALTREX) 1000 MG tablet Take by mouth.    PHQ 2/9 Scores 08/07/2019 08/07/2019 04/07/2019 11/12/2018  PHQ - 2 Score 0 0 0 0  PHQ- 9 Score 12 - 0 1    BP Readings from Last 3 Encounters:  08/07/19 120/84  04/07/19 136/80  03/04/19 138/90    Physical Exam Vitals signs and nursing note reviewed.  Constitutional:      General: She is not in acute distress.    Appearance: She is well-developed.  HENT:     Head: Normocephalic and atraumatic.  Neck:     Musculoskeletal: Normal range of motion and neck supple.  Cardiovascular:     Rate and Rhythm: Normal rate and regular rhythm.     Pulses: Normal pulses.     Heart sounds: No murmur.  Pulmonary:     Effort: Pulmonary effort is normal. No respiratory distress.     Breath sounds: No wheezing or rhonchi.  Musculoskeletal:     Right lower leg: No edema.     Left lower leg: No edema.  Lymphadenopathy:     Cervical: No cervical adenopathy.   Skin:    General: Skin is warm and dry.     Capillary Refill: Capillary refill takes less than 2 seconds.     Findings: No rash.  Neurological:     General: No focal deficit present.     Mental Status: She is alert and oriented to person, place, and time.  Psychiatric:        Attention and Perception: Attention normal.        Mood and Affect: Mood normal.        Speech: Speech normal.        Behavior: Behavior normal.        Thought Content: Thought content normal. Thought content does not include suicidal ideation. Thought content does not include suicidal plan.        Cognition and Memory: Cognition normal.     Wt Readings from Last 3 Encounters:  08/07/19 (!) 306 lb (138.8 kg)  04/07/19 (!) 314 lb (142.4 kg)  03/04/19 (!) 313 lb (142 kg)    BP 120/84   Pulse 60  Temp (!) 97 F (36.1 C) (Temporal)   Ht 5\' 5"  (1.651 m)   Wt (!) 306 lb (138.8 kg)   SpO2 96%   BMI 50.92 kg/m   Assessment and Plan: 1. Hyperlipidemia associated with type 2 diabetes mellitus ([place]) Now on pravastatin 40 mg and tolerating it fairly well Will check labs - Lipid panel  2. Type II diabetes mellitus with complication (HCC) Clinically stable by exam and report without s/s of hypoglycemia. DM complicated by lipids. Tolerating medications januvia 100 mg well without side effects or other concerns. - Hemoglobin A1c - Comprehensive metabolic panel - sitaGLIPtin (JANUVIA) 100 MG tablet; Take 1 tablet (100 mg total) by mouth daily.  Dispense: 90 tablet; Refill: 3  3. Depression with anxiety Long standing symptoms that are worsening and interfering with work and home Continue counseling.   No SI/HI. Start lexapro - low dose due to least side effect - escitalopram (LEXAPRO) 10 MG tablet; Take 1 tablet (10 mg total) by mouth daily.  Dispense: 30 tablet; Refill: 1  5. Need for immunization against influenza - Flu Vaccine QUAD 36+ mos IM  Partially dictated using Editor, commissioning. Any errors are  unintentional.  Halina Maidens, MD [facility]  08/07/2019

## 2019-08-08 LAB — COMPREHENSIVE METABOLIC PANEL
ALT: 43 IU/L — ABNORMAL HIGH (ref 0–32)
AST: 36 IU/L (ref 0–40)
Albumin/Globulin Ratio: 1.5 (ref 1.2–2.2)
Albumin: 4.1 g/dL (ref 3.8–4.9)
Alkaline Phosphatase: 83 IU/L (ref 39–117)
BUN/Creatinine Ratio: 17 (ref 9–23)
BUN: 16 mg/dL (ref 6–24)
Bilirubin Total: 0.6 mg/dL (ref 0.0–1.2)
CO2: 27 mmol/L (ref 20–29)
Calcium: 8.9 mg/dL (ref 8.7–10.2)
Chloride: 98 mmol/L (ref 96–106)
Creatinine, Ser: 0.96 mg/dL (ref 0.57–1.00)
GFR calc Af Amer: 78 mL/min/{1.73_m2} (ref >59)
GFR calc non Af Amer: 67 mL/min/{1.73_m2} (ref >59)
Globulin, Total: 2.7 g/dL (ref 1.5–4.5)
Glucose: 139 mg/dL — ABNORMAL HIGH (ref 65–99)
Potassium: 4.6 mmol/L (ref 3.5–5.2)
Sodium: 139 mmol/L (ref 134–144)
Total Protein: 6.8 g/dL (ref 6.0–8.5)

## 2019-08-08 LAB — LIPID PANEL
Chol/HDL Ratio: 2.8 ratio (ref 0.0–4.4)
Cholesterol, Total: 174 mg/dL (ref 100–199)
HDL: 62 mg/dL (ref >39)
LDL Chol Calc (NIH): 86 mg/dL (ref 0–99)
Triglycerides: 149 mg/dL (ref 0–149)
VLDL Cholesterol Cal: 26 mg/dL (ref 5–40)

## 2019-08-08 LAB — HEMOGLOBIN A1C
Est. average glucose Bld gHb Est-mCnc: 200 mg/dL
Hgb A1c MFr Bld: 8.6 % — ABNORMAL HIGH (ref 4.8–5.6)

## 2019-08-12 ENCOUNTER — Other Ambulatory Visit: Payer: Self-pay | Admitting: Internal Medicine

## 2019-08-20 ENCOUNTER — Other Ambulatory Visit: Payer: Self-pay

## 2019-08-20 ENCOUNTER — Encounter: Payer: Self-pay | Admitting: Internal Medicine

## 2019-08-20 ENCOUNTER — Other Ambulatory Visit: Payer: Self-pay | Admitting: Internal Medicine

## 2019-08-20 ENCOUNTER — Ambulatory Visit (INDEPENDENT_AMBULATORY_CARE_PROVIDER_SITE_OTHER): Payer: 59 | Admitting: Internal Medicine

## 2019-08-20 VITALS — BP 128/82 | HR 61 | Ht 65.0 in | Wt 312.0 lb

## 2019-08-20 DIAGNOSIS — E118 Type 2 diabetes mellitus with unspecified complications: Secondary | ICD-10-CM | POA: Diagnosis not present

## 2019-08-20 NOTE — Progress Notes (Signed)
Date:  08/20/2019   Name:  Mary Macias   DOB:  06/16/1966   MRN:  [id number]   Chief Complaint: Diabetes (Instructions on new DM meds.) Patient is here to start Trulicity to better control DM.  She has been intolerant of metformin due to diarrhea.  She needs to lose weight so we are going to start GLP-1 agent. She is familiar with giving injections - her father's insulin and she also administered Lovenox to herself years ago. HPI  Lab Results  Component Value Date   CREATININE 0.96 08/07/2019   BUN 16 08/07/2019   NA 139 08/07/2019   K 4.6 08/07/2019   CL 98 08/07/2019   CO2 27 08/07/2019   Lab Results  Component Value Date   CHOL 174 08/07/2019   HDL 62 08/07/2019   LDLCALC 86 08/07/2019   TRIG 149 08/07/2019   CHOLHDL 2.8 08/07/2019   Lab Results  Component Value Date   TSH 2.500 11/12/2018   Lab Results  Component Value Date   HGBA1C 8.6 (H) 08/07/2019     Review of Systems  Constitutional: Negative for chills, fatigue and fever.  Respiratory: Negative for chest tightness and shortness of breath.     Patient Active Problem List   Diagnosis Date Noted  . Depression with anxiety 08/07/2019  . Peripheral edema 02/08/2018  . Morbid (severe) obesity due to excess calories ([place]) 01/25/2018  . Colon polyp, hyperplastic 07/16/2017  . Type II diabetes mellitus with complication ([place]) 99991111  . Hyperlipidemia associated with type 2 diabetes mellitus ([place]) 07/08/2015  . Environmental and seasonal allergies 07/08/2015  . Avitaminosis D 07/08/2015  . Beat, premature ventricular 02/11/2015    Allergies  Allergen Reactions  . Povidone Iodine Rash    Other Reaction: Other reaction  . Atorvastatin Other (See Comments)    Severe body aches  . Meloxicam Other (See Comments)    Kidney function reduced   . Cephalexin Rash  . Metformin And Related Diarrhea  . Peanut-Containing Drug Products Rash    Past Surgical History:  Procedure Laterality Date  .  ABDOMINAL HYSTERECTOMY    . CHOLECYSTECTOMY    . COLONOSCOPY WITH PROPOFOL N/A 07/13/2017   Procedure: COLONOSCOPY WITH PROPOFOL;  Surgeon: Lollie Sails, MD;  Location: [facility] ENDOSCOPY;  Service: Endoscopy;  Laterality: N/A;  . NASAL SINUS SURGERY    . OOPHORECTOMY    . TOTAL VAGINAL HYSTERECTOMY    . TUBAL LIGATION      Social History   Tobacco Use  . Smoking status: Never Smoker  . Smokeless tobacco: Never Used  Substance Use Topics  . Alcohol use: No    Alcohol/week: 0.0 standard drinks  . Drug use: No     Medication list has been reviewed and updated.  Current Meds  Medication Sig  . acetaminophen (TYLENOL) 650 MG CR tablet Take 650 mg by mouth every 8 (eight) hours as needed for pain.  Marland Kitchen acyclovir ointment (ZOVIRAX) 5 % Apply 1 application topically every 3 (three) hours.  Marland Kitchen escitalopram (LEXAPRO) 10 MG tablet Take 1 tablet (10 mg total) by mouth daily.  . fexofenadine (ALLEGRA) 180 MG tablet Take 180 mg by mouth daily.  . mupirocin nasal ointment (BACTROBAN) 2 % Place 1 application into the nose 2 (two) times daily.  Marland Kitchen nystatin cream (MYCOSTATIN) Apply 1 application topically 2 (two) times daily.  . pravastatin (PRAVACHOL) 40 MG tablet Take 40 mg by mouth daily.  . Probiotic Product (PROBIOTIC-10 PO) Take by  mouth.  . sitaGLIPtin (JANUVIA) 100 MG tablet Take 1 tablet (100 mg total) by mouth daily.  Marland Kitchen triamcinolone (NASACORT ALLERGY 24HR) 55 MCG/ACT AERO nasal inhaler Place 2 sprays into the nose daily.  . valACYclovir (VALTREX) 1000 MG tablet Take by mouth.    PHQ 2/9 Scores 08/20/2019 08/07/2019 08/07/2019 04/07/2019  PHQ - 2 Score 2 0 0 0  PHQ- 9 Score 5 12 - 0    BP Readings from Last 3 Encounters:  08/20/19 128/82  08/07/19 120/84  04/07/19 136/80    Physical Exam Vitals signs and nursing note reviewed.  Constitutional:      General: She is not in acute distress.    Appearance: She is well-developed.  HENT:     Head: Normocephalic and atraumatic.   Pulmonary:     Effort: Pulmonary effort is normal. No respiratory distress.  Musculoskeletal: Normal range of motion.  Skin:    General: Skin is warm and dry.     Findings: No rash.  Neurological:     Mental Status: She is alert and oriented to person, place, and time.  Psychiatric:        Behavior: Behavior normal.        Thought Content: Thought content normal.     Wt Readings from Last 3 Encounters:  08/20/19 (!) 312 lb (141.5 kg)  08/07/19 (!) 306 lb (138.8 kg)  04/07/19 (!) 314 lb (142.4 kg)    BP 128/82   Pulse 61   Ht 5\' 5"  (1.651 m)   Wt (!) 312 lb (141.5 kg)   SpO2 97%   BMI 51.92 kg/m   Assessment and Plan: 1. Type II diabetes mellitus with complication (HCC) DM is not controlled on diet and Januvia. Pt instructed in use of Trulicity.  Samples for 0.75 mg weekly x 2 weeks then 1.5 mg weekly x 2 weeks given Call after the third dose for the prescription. Follow up for problems or concerns and in February.   Partially dictated using Editor, [place]. Any errors are unintentional.  Halina Maidens, MD [facility]  08/20/2019

## 2019-09-01 ENCOUNTER — Other Ambulatory Visit: Payer: Self-pay | Admitting: Internal Medicine

## 2019-09-01 DIAGNOSIS — F418 Other specified anxiety disorders: Secondary | ICD-10-CM

## 2019-09-15 ENCOUNTER — Telehealth: Payer: Self-pay

## 2019-09-15 ENCOUNTER — Other Ambulatory Visit: Payer: Self-pay | Admitting: Internal Medicine

## 2019-09-15 DIAGNOSIS — E118 Type 2 diabetes mellitus with unspecified complications: Secondary | ICD-10-CM

## 2019-09-15 MED ORDER — TRULICITY 1.5 MG/0.5ML ~~LOC~~ SOAJ: 1.5000 mg | SUBCUTANEOUS | 1 refills | Status: DC

## 2019-09-15 NOTE — Telephone Encounter (Signed)
Pt called saying she is having SEVERE acid reflux for the past week. She wonders if this could be a combination of the lexapro and the Trulicity. She said her throat is constantly burning and feels like it is "on fire." She said she knows she is supposed to avoid PPI's but she had to buy one over the counter to give her some sleep lastnight because the burning was so bad.   Wants to know if she needs to change her medications up or what to do before her throat "becomes raw."  Please advise.

## 2019-09-15 NOTE — Telephone Encounter (Signed)
She said its on the label for her Lexapro medication. Informed patient you found no interaction with Lexapro and Nexium. Pt will take OTC Nexium daily. She did use all 4 injections for Trulicity and is now out. Said if you want her to continue medication she will need a Rx for Trulicity sent to [facility].

## 2019-09-15 NOTE — Telephone Encounter (Signed)
What was she told not to take PPIs?  That would be the next thing I would prescribe for her symptoms.

## 2019-09-24 LAB — HM DIABETES EYE EXAM

## 2019-09-29 ENCOUNTER — Ambulatory Visit: Payer: 59 | Admitting: Internal Medicine

## 2019-10-22 ENCOUNTER — Encounter: Payer: Self-pay | Admitting: Internal Medicine

## 2019-10-22 ENCOUNTER — Ambulatory Visit (INDEPENDENT_AMBULATORY_CARE_PROVIDER_SITE_OTHER): Payer: 59 | Admitting: Internal Medicine

## 2019-10-22 ENCOUNTER — Other Ambulatory Visit: Payer: Self-pay

## 2019-10-22 VITALS — BP 134/78 | HR 82 | Ht 65.0 in | Wt 322.0 lb

## 2019-10-22 DIAGNOSIS — F418 Other specified anxiety disorders: Secondary | ICD-10-CM | POA: Diagnosis not present

## 2019-10-22 DIAGNOSIS — E118 Type 2 diabetes mellitus with unspecified complications: Secondary | ICD-10-CM

## 2019-10-22 NOTE — Patient Instructions (Signed)
Covid Vaccine locations: [employer] Dept. [phone]

## 2019-10-22 NOTE — Progress Notes (Signed)
Date:  10/22/2019   Name:  Mary Macias   DOB:  1966-07-21   MRN:  [id number]   Chief Complaint: Depression (Follow up.) and Diabetes (MICRO. )  Depression        Chronicity: and anxiety.  The problem has been gradually improving since onset.  Associated symptoms include no fatigue, no decreased interest, no headaches and no suicidal ideas.  Past treatments include SSRIs - Selective serotonin reuptake inhibitors.  Compliance with treatment is good. Diabetes She presents for her follow-up diabetic visit. She has type 2 diabetes mellitus. Pertinent negatives for hypoglycemia include no dizziness or headaches. Pertinent negatives for diabetes include no chest pain and no fatigue. Current diabetic treatments: [place] and Trulicity. She is compliant with treatment all of the time (some excess gas that has improved with nexium daily). Home blood sugar record trend: she has not been checking BS.    Lab Results  Component Value Date   CREATININE 0.96 08/07/2019   BUN 16 08/07/2019   NA 139 08/07/2019   K 4.6 08/07/2019   CL 98 08/07/2019   CO2 27 08/07/2019   Lab Results  Component Value Date   CHOL 174 08/07/2019   HDL 62 08/07/2019   LDLCALC 86 08/07/2019   TRIG 149 08/07/2019   CHOLHDL 2.8 08/07/2019   Lab Results  Component Value Date   TSH 2.500 11/12/2018   Lab Results  Component Value Date   HGBA1C 8.6 (H) 08/07/2019     Review of Systems  Constitutional: Negative for chills, fatigue and fever.  Respiratory: Negative for shortness of breath and wheezing.   Cardiovascular: Negative for chest pain, palpitations and leg swelling.  Gastrointestinal: Negative for abdominal pain.       Excess gas - belching and flatus  Neurological: Negative for dizziness, light-headedness and headaches.  Psychiatric/Behavioral: Positive for depression. Negative for suicidal ideas.    Patient Active Problem List   Diagnosis Date Noted  . Depression with anxiety 08/07/2019  .  Peripheral edema 02/08/2018  . Morbid (severe) obesity due to excess calories ([place]) 01/25/2018  . Colon polyp, hyperplastic 07/16/2017  . Type II diabetes mellitus with complication ([place]) 99991111  . Hyperlipidemia associated with type 2 diabetes mellitus ([place]) 07/08/2015  . Environmental and seasonal allergies 07/08/2015  . Avitaminosis D 07/08/2015  . Beat, premature ventricular 02/11/2015    Allergies  Allergen Reactions  . Povidone Iodine Rash    Other Reaction: Other reaction  . Atorvastatin Other (See Comments)    Severe body aches  . Meloxicam Other (See Comments)    Kidney function reduced   . Cephalexin Rash  . Metformin And Related Diarrhea  . Peanut-Containing Drug Products Rash    Past Surgical History:  Procedure Laterality Date  . ABDOMINAL HYSTERECTOMY    . CHOLECYSTECTOMY    . COLONOSCOPY WITH PROPOFOL N/A 07/13/2017   Procedure: COLONOSCOPY WITH PROPOFOL;  Surgeon: Lollie Sails, MD;  Location: [facility] ENDOSCOPY;  Service: Endoscopy;  Laterality: N/A;  . NASAL SINUS SURGERY    . OOPHORECTOMY    . TOTAL VAGINAL HYSTERECTOMY    . TUBAL LIGATION      Social History   Tobacco Use  . Smoking status: Never Smoker  . Smokeless tobacco: Never Used  Substance Use Topics  . Alcohol use: No    Alcohol/week: 0.0 standard drinks  . Drug use: No     Medication list has been reviewed and updated.  Current Meds  Medication Sig  . acetaminophen (  TYLENOL) 650 MG CR tablet Take 650 mg by mouth every 8 (eight) hours as needed for pain.  Marland Kitchen acyclovir ointment (ZOVIRAX) 5 % Apply 1 application topically every 3 (three) hours.  . Dulaglutide (TRULICITY) 1.5 0000000 SOPN Inject 1.5 mg into the skin once a week.  . escitalopram (LEXAPRO) 10 MG tablet TAKE 1 TABLET BY MOUTH EVERY DAY  . fexofenadine (ALLEGRA) 180 MG tablet Take 180 mg by mouth daily.  . mupirocin nasal ointment (BACTROBAN) 2 % Place 1 application into the nose 2 (two) times daily.  Marland Kitchen nystatin cream  (MYCOSTATIN) Apply 1 application topically 2 (two) times daily.  . pravastatin (PRAVACHOL) 40 MG tablet Take 40 mg by mouth daily.  . Probiotic Product (PROBIOTIC-10 PO) Take by mouth.  . sitaGLIPtin (JANUVIA) 100 MG tablet Take 1 tablet (100 mg total) by mouth daily.  Marland Kitchen triamcinolone (NASACORT ALLERGY 24HR) 55 MCG/ACT AERO nasal inhaler Place 2 sprays into the nose daily.  . valACYclovir (VALTREX) 1000 MG tablet Take by mouth.    PHQ 2/9 Scores 10/22/2019 08/20/2019 08/07/2019 08/07/2019  PHQ - 2 Score 0 2 0 0  PHQ- 9 Score 2 5 12  -   GAD 7 : Generalized Anxiety Score 10/22/2019 08/07/2019  Nervous, Anxious, on Edge 3 3  Control/stop worrying 2 3  Worry too much - different things 2 3  Trouble relaxing 2 2  Restless 0 1  Easily annoyed or irritable 1 3  Afraid - awful might happen 0 1  Total GAD 7 Score 10 16  Anxiety Difficulty Somewhat difficult Somewhat difficult     BP Readings from Last 3 Encounters:  10/22/19 134/78  08/20/19 128/82  08/07/19 120/84    Physical Exam Vitals and nursing note reviewed.  Constitutional:      General: She is not in acute distress.    Appearance: She is well-developed.  HENT:     Head: Normocephalic and atraumatic.  Cardiovascular:     Rate and Rhythm: Normal rate and regular rhythm.     Pulses: Normal pulses.  Pulmonary:     Effort: Pulmonary effort is normal. No respiratory distress.  Musculoskeletal:     Right lower leg: No edema.     Left lower leg: No edema.  Skin:    General: Skin is warm and dry.     Findings: No rash.  Neurological:     Mental Status: She is alert and oriented to person, place, and time.  Psychiatric:        Attention and Perception: Attention normal.        Mood and Affect: Mood normal.        Behavior: Behavior normal.        Thought Content: Thought content normal. Thought content does not include suicidal plan.     Wt Readings from Last 3 Encounters:  10/22/19 (!) 322 lb (146.1 kg)  08/20/19 (!)  312 lb (141.5 kg)  08/07/19 (!) 306 lb (138.8 kg)    BP 134/78   Pulse 82   Ht 5\' 5"  (1.651 m)   Wt (!) 322 lb (146.1 kg)   SpO2 97%   BMI 53.58 kg/m   Assessment and Plan: 1. Depression with anxiety Clinically stable and improved on lexapro. No side effects noted Will continue current dose and recheck next visit  2. Type II diabetes mellitus with complication (HCC) Now on Januvia andTrulicity - side effects are currently tolerable with the addition of Nexium She is not checking blood sugars - has  appt next month for labs  Will review and consider changing medications if needed   Partially dictated using Editor, commissioning. Any errors are unintentional.  Halina Maidens, MD [facility]  10/22/2019

## 2019-11-14 ENCOUNTER — Encounter: Payer: 59 | Admitting: Internal Medicine

## 2019-11-14 ENCOUNTER — Other Ambulatory Visit: Payer: Self-pay | Admitting: Internal Medicine

## 2019-11-14 DIAGNOSIS — Z1231 Encounter for screening mammogram for malignant neoplasm of breast: Secondary | ICD-10-CM

## 2019-11-17 ENCOUNTER — Other Ambulatory Visit: Payer: Self-pay

## 2019-11-17 ENCOUNTER — Encounter: Payer: Self-pay | Admitting: Internal Medicine

## 2019-11-17 ENCOUNTER — Ambulatory Visit (INDEPENDENT_AMBULATORY_CARE_PROVIDER_SITE_OTHER): Payer: 59 | Admitting: Internal Medicine

## 2019-11-17 ENCOUNTER — Other Ambulatory Visit: Payer: Self-pay | Admitting: Internal Medicine

## 2019-11-17 VITALS — BP 140/80 | HR 85 | Temp 98.2°F | Ht 65.0 in | Wt 324.0 lb

## 2019-11-17 DIAGNOSIS — Z0000 Encounter for general adult medical examination without abnormal findings: Secondary | ICD-10-CM

## 2019-11-17 DIAGNOSIS — Z23 Encounter for immunization: Secondary | ICD-10-CM | POA: Diagnosis not present

## 2019-11-17 DIAGNOSIS — Z1231 Encounter for screening mammogram for malignant neoplasm of breast: Secondary | ICD-10-CM | POA: Diagnosis not present

## 2019-11-17 DIAGNOSIS — I493 Ventricular premature depolarization: Secondary | ICD-10-CM

## 2019-11-17 DIAGNOSIS — E118 Type 2 diabetes mellitus with unspecified complications: Secondary | ICD-10-CM

## 2019-11-17 DIAGNOSIS — Z6841 Body Mass Index (BMI) 40.0 and over, adult: Secondary | ICD-10-CM

## 2019-11-17 DIAGNOSIS — E785 Hyperlipidemia, unspecified: Secondary | ICD-10-CM

## 2019-11-17 DIAGNOSIS — F418 Other specified anxiety disorders: Secondary | ICD-10-CM | POA: Diagnosis not present

## 2019-11-17 DIAGNOSIS — E1169 Type 2 diabetes mellitus with other specified complication: Secondary | ICD-10-CM

## 2019-11-17 DIAGNOSIS — K635 Polyp of colon: Secondary | ICD-10-CM

## 2019-11-17 LAB — POCT URINALYSIS DIPSTICK
Bilirubin, UA: NEGATIVE
Blood, UA: NEGATIVE
Glucose, UA: NEGATIVE
Ketones, UA: NEGATIVE
Leukocytes, UA: NEGATIVE
Nitrite, UA: NEGATIVE
Protein, UA: NEGATIVE
Spec Grav, UA: 1.015 (ref 1.010–1.025)
Urobilinogen, UA: 0.2 E.U./dL
pH, UA: 6 (ref 5.0–8.0)

## 2019-11-17 NOTE — Progress Notes (Signed)
Date:  11/17/2019   Name:  Mary Macias   DOB:  [birth year]   MRN:  [id number]   Chief Complaint: Annual Exam (Breast Exam. ) Mary Macias is a 54 y.o. female who presents today for her Complete Annual Exam. She feels well. She reports exercising twice a week with personal trainer. She reports she is sleeping fairly well. She denies breast complaints.  Mammogram is scheduled.  Mammogram  11/2018 - scheduled Pap smear - discontinued Colonoscopy  07/2017 9 polyps - follow up 1yrs?  Immunization History  Administered Date(s) Administered  . Influenza,inj,Quad PF,6+ Mos 10/12/2015, 07/14/2016, 06/14/2018, 08/07/2019  . Influenza-Unspecified 08/08/2017  . Pneumococcal Polysaccharide-23 01/25/2018  . Tdap 01/07/2015    Diabetes She presents for her follow-up diabetic visit. She has type 2 diabetes mellitus. Pertinent negatives for hypoglycemia include no dizziness, headaches, nervousness/anxiousness or tremors. Pertinent negatives for diabetes include no chest pain, no fatigue, no polydipsia and no polyuria. Symptoms are stable. Current diabetic treatment includes oral agent (dual therapy) (Januvia and Trulicity; metformin stopped due to diarrhea). She is compliant with treatment most of the time (side effects to Trulicity). Her weight is stable. Her breakfast blood glucose is taken between 6-7 am. Her breakfast blood glucose range is generally 180-200 mg/dl. An ACE inhibitor/angiotensin II receptor blocker is not being taken. Eye exam is not current.  Hyperlipidemia This is a chronic problem. The problem is controlled. Pertinent negatives include no chest pain or shortness of breath. Current antihyperlipidemic treatment includes statins. The current treatment provides significant improvement of lipids.    Lab Results  Component Value Date   CREATININE 0.96 08/07/2019   BUN 16 08/07/2019   NA 139 08/07/2019   K 4.6 08/07/2019   CL 98 08/07/2019   CO2 27 08/07/2019   Lab  Results  Component Value Date   CHOL 174 08/07/2019   HDL 62 08/07/2019   LDLCALC 86 08/07/2019   TRIG 149 08/07/2019   CHOLHDL 2.8 08/07/2019   Lab Results  Component Value Date   TSH 2.500 11/12/2018   Lab Results  Component Value Date   HGBA1C 8.6 (H) 08/07/2019     Review of Systems  Constitutional: Negative for chills, fatigue, fever and unexpected weight change.  HENT: Negative for congestion, hearing loss, tinnitus, trouble swallowing and voice change.   Eyes: Negative for visual disturbance.  Respiratory: Negative for cough, chest tightness, shortness of breath and wheezing.   Cardiovascular: Negative for chest pain, palpitations and leg swelling.  Gastrointestinal: Positive for abdominal distention (bloating and gas). Negative for abdominal pain, constipation, diarrhea and vomiting.  Endocrine: Negative for polydipsia and polyuria.  Genitourinary: Negative for dysuria, frequency, genital sores, vaginal bleeding and vaginal discharge.  Musculoskeletal: Negative for arthralgias, gait problem and joint swelling.  Skin: Negative for color change and rash.  Neurological: Negative for dizziness, tremors, light-headedness and headaches.  Hematological: Negative for adenopathy. Does not bruise/bleed easily.  Psychiatric/Behavioral: Negative for dysphoric mood and sleep disturbance. The patient is not nervous/anxious.     Patient Active Problem List   Diagnosis Date Noted  . Depression with anxiety 08/07/2019  . Peripheral edema 02/08/2018  . BMI 50.0-59.9, adult ([place]) 01/25/2018  . Colon polyp, hyperplastic 07/16/2017  . Type II diabetes mellitus with complication ([place]) 99991111  . Hyperlipidemia associated with type 2 diabetes mellitus ([place]) 07/08/2015  . Environmental and seasonal allergies 07/08/2015  . Avitaminosis D 07/08/2015  . Beat, premature ventricular 02/11/2015    Allergies  Allergen Reactions  .  Povidone Iodine Rash    Other Reaction: Other reaction    . Atorvastatin Other (See Comments)    Severe body aches  . Meloxicam Other (See Comments)    Kidney function reduced   . Cephalexin Rash  . Metformin And Related Diarrhea  . Peanut-Containing Drug Products Rash    Past Surgical History:  Procedure Laterality Date  . ABDOMINAL HYSTERECTOMY    . CHOLECYSTECTOMY    . COLONOSCOPY WITH PROPOFOL N/A 07/13/2017   Procedure: COLONOSCOPY WITH PROPOFOL;  Surgeon: Lollie Sails, MD;  Location: [facility] ENDOSCOPY;  Service: Endoscopy;  Laterality: N/A;  . NASAL SINUS SURGERY    . OOPHORECTOMY    . TOTAL VAGINAL HYSTERECTOMY    . TUBAL LIGATION      Social History   Tobacco Use  . Smoking status: Never Smoker  . Smokeless tobacco: Never Used  Substance Use Topics  . Alcohol use: No    Alcohol/week: 0.0 standard drinks  . Drug use: No     Medication list has been reviewed and updated.  Current Meds  Medication Sig  . acetaminophen (TYLENOL) 650 MG CR tablet Take 650 mg by mouth every 8 (eight) hours as needed for pain.  Marland Kitchen acyclovir ointment (ZOVIRAX) 5 % Apply 1 application topically every 3 (three) hours.  . Dulaglutide (TRULICITY) 1.5 0000000 SOPN Inject 1.5 mg into the skin once a week.  . escitalopram (LEXAPRO) 10 MG tablet TAKE 1 TABLET BY MOUTH EVERY DAY  . fexofenadine (ALLEGRA) 180 MG tablet Take 180 mg by mouth daily.  . mupirocin nasal ointment (BACTROBAN) 2 % Place 1 application into the nose 2 (two) times daily.  Marland Kitchen nystatin cream (MYCOSTATIN) Apply 1 application topically 2 (two) times daily.  . pravastatin (PRAVACHOL) 40 MG tablet Take 40 mg by mouth daily.  . Probiotic Product (PROBIOTIC-10 PO) Take by mouth.  . sitaGLIPtin (JANUVIA) 100 MG tablet Take 1 tablet (100 mg total) by mouth daily.  Marland Kitchen triamcinolone (NASACORT ALLERGY 24HR) 55 MCG/ACT AERO nasal inhaler Place 2 sprays into the nose daily.  . valACYclovir (VALTREX) 1000 MG tablet Take by mouth.    PHQ 2/9 Scores 11/17/2019 10/22/2019 08/20/2019 08/07/2019   PHQ - 2 Score 0 0 2 0  PHQ- 9 Score 0 2 5 12     BP Readings from Last 3 Encounters:  11/17/19 140/80  10/22/19 134/78  08/20/19 128/82    Physical Exam Vitals and nursing note reviewed.  Constitutional:      General: She is not in acute distress.    Appearance: She is well-developed. She is obese.  HENT:     Head: Normocephalic and atraumatic.     Right Ear: Tympanic membrane and ear canal normal.     Left Ear: Tympanic membrane and ear canal normal.     Nose:     Right Sinus: No maxillary sinus tenderness.     Left Sinus: No maxillary sinus tenderness.  Eyes:     General: No scleral icterus.       Right eye: No discharge.        Left eye: No discharge.     Conjunctiva/sclera: Conjunctivae normal.  Neck:     Thyroid: No thyromegaly.     Vascular: No carotid bruit.  Cardiovascular:     Rate and Rhythm: Normal rate and regular rhythm.     Pulses: Normal pulses.     Heart sounds: Normal heart sounds. No murmur.  Pulmonary:     Effort: Pulmonary effort is normal.  No respiratory distress.     Breath sounds: No wheezing.  Chest:     Breasts:        Right: No mass, nipple discharge, skin change or tenderness.        Left: No mass, nipple discharge, skin change or tenderness.  Abdominal:     General: Bowel sounds are normal.     Palpations: Abdomen is soft. There is no mass.     Tenderness: There is no abdominal tenderness. There is no guarding.  Musculoskeletal:        General: Normal range of motion.     Cervical back: Normal range of motion. No erythema.     Right lower leg: No edema.     Left lower leg: No edema.  Lymphadenopathy:     Cervical: No cervical adenopathy.  Skin:    General: Skin is warm and dry.     Capillary Refill: Capillary refill takes less than 2 seconds.     Findings: No rash.  Neurological:     Mental Status: She is alert and oriented to person, place, and time.     Cranial Nerves: No cranial nerve deficit.     Sensory: No sensory deficit.      Deep Tendon Reflexes: Reflexes are normal and symmetric.  Psychiatric:        Speech: Speech normal.        Behavior: Behavior normal.        Thought Content: Thought content normal.     Wt Readings from Last 3 Encounters:  11/17/19 (!) 324 lb (147 kg)  10/22/19 (!) 322 lb (146.1 kg)  08/20/19 (!) 312 lb (141.5 kg)    BP 140/80   Pulse 85   Temp 98.2 F (36.8 C) (Oral)   Ht 5\' 5"  (1.651 m)   Wt (!) 324 lb (147 kg)   SpO2 95%   BMI 53.92 kg/m   Assessment and Plan: 1. Annual physical exam Normal exam except for weight  2. Encounter for screening mammogram for breast cancer Mammogram is scheduled  3. Type II diabetes mellitus with complication (HCC) Clinically stable by exam and report without s/s of hypoglycemia. DM complicated by lipids. Not tolerating Trulicity due to gas and abdominal discomfort and nausea Will try Farxiga 10 mg - sample given Continue Januvia - Comprehensive metabolic panel - Hemoglobin A1c - Microalbumin / creatinine urine ratio - POCT urinalysis dipstick  4. Hyperlipidemia associated with type 2 diabetes mellitus ([place]) Tolerating statin medication (pravachol)without side effects at this time,  LDL is at goal of < 70 on current dose Continue same therapy without change at this time. - Lipid panel  5. Need for shingles vaccine First dose today - Varicella-zoster vaccine IM  6. Depression with anxiety Clinically stable on Lexapro without side effects Pt desires to continue medication - TSH  7. BMI 50.0-59.9, adult ([place]) Hopefully Farxiga with encourage weight loss  8. Beat, premature ventricular Being followed by Cardiology Had been advised to take a magnesium supplement - Magnesium  9. Hyperplastic colonic polyp, unspecified part of colon Due for repeat in 2022 - CBC with Differential/Platelet   Partially dictated using Editor, commissioning. Any errors are unintentional.  Halina Maidens, MD [facility]  11/17/2019

## 2019-11-17 NOTE — Patient Instructions (Signed)
Stop Trulicity  Begin Farxiga 10 mg once a day; continue Januvia Call for a prescription if tolerated and be sure to use the savings card.

## 2019-11-18 ENCOUNTER — Encounter: Payer: 59 | Admitting: Internal Medicine

## 2019-11-19 LAB — MICROALBUMIN / CREATININE URINE RATIO
Creatinine, Urine: 106.8 mg/dL
Microalb/Creat Ratio: 10 mg/g creat (ref 0–29)
Microalbumin, Urine: 10.7 ug/mL

## 2019-11-22 ENCOUNTER — Encounter: Payer: Self-pay | Admitting: Internal Medicine

## 2019-11-22 DIAGNOSIS — R7989 Other specified abnormal findings of blood chemistry: Secondary | ICD-10-CM | POA: Insufficient documentation

## 2019-11-22 DIAGNOSIS — K760 Fatty (change of) liver, not elsewhere classified: Secondary | ICD-10-CM | POA: Insufficient documentation

## 2019-11-22 LAB — COMPREHENSIVE METABOLIC PANEL
ALT: 39 IU/L — ABNORMAL HIGH (ref 0–32)
AST: 56 IU/L — ABNORMAL HIGH (ref 0–40)
Albumin/Globulin Ratio: 1.6 (ref 1.2–2.2)
Albumin: 4.1 g/dL (ref 3.8–4.9)
Alkaline Phosphatase: 74 IU/L (ref 39–117)
BUN/Creatinine Ratio: 11 (ref 9–23)
BUN: 12 mg/dL (ref 6–24)
Bilirubin Total: 0.5 mg/dL (ref 0.0–1.2)
CO2: 23 mmol/L (ref 20–29)
Calcium: 9.1 mg/dL (ref 8.7–10.2)
Chloride: 99 mmol/L (ref 96–106)
Creatinine, Ser: 1.06 mg/dL — ABNORMAL HIGH (ref 0.57–1.00)
GFR calc Af Amer: 68 mL/min/{1.73_m2} (ref >59)
GFR calc non Af Amer: 59 mL/min/{1.73_m2} — ABNORMAL LOW (ref >59)
Globulin, Total: 2.6 g/dL (ref 1.5–4.5)
Glucose: 155 mg/dL — ABNORMAL HIGH (ref 65–99)
Potassium: 4.4 mmol/L (ref 3.5–5.2)
Sodium: 140 mmol/L (ref 134–144)
Total Protein: 6.7 g/dL (ref 6.0–8.5)

## 2019-11-22 LAB — HEMOGLOBIN A1C
Est. average glucose Bld gHb Est-mCnc: 174 mg/dL
Hgb A1c MFr Bld: 7.7 % — ABNORMAL HIGH (ref 4.8–5.6)

## 2019-11-22 LAB — CBC WITH DIFFERENTIAL/PLATELET
Basophils Absolute: 0 10*3/uL (ref 0.0–0.2)
Basos: 1 %
EOS (ABSOLUTE): 0.3 10*3/uL (ref 0.0–0.4)
Eos: 3 %
Hematocrit: 44.8 % (ref 34.0–46.6)
Hemoglobin: 14.6 g/dL (ref 11.1–15.9)
Immature Grans (Abs): 0 10*3/uL (ref 0.0–0.1)
Immature Granulocytes: 0 %
Lymphocytes Absolute: 2.1 10*3/uL (ref 0.7–3.1)
Lymphs: 27 %
MCH: 30.5 pg (ref 26.6–33.0)
MCHC: 32.6 g/dL (ref 31.5–35.7)
MCV: 94 fL (ref 79–97)
Monocytes Absolute: 0.5 10*3/uL (ref 0.1–0.9)
Monocytes: 7 %
Neutrophils Absolute: 4.8 10*3/uL (ref 1.4–7.0)
Neutrophils: 62 %
Platelets: 323 10*3/uL (ref 150–450)
RBC: 4.79 x10E6/uL (ref 3.77–5.28)
RDW: 12.3 % (ref 11.7–15.4)
WBC: 7.7 10*3/uL (ref 3.4–10.8)

## 2019-11-22 LAB — LIPID PANEL
Chol/HDL Ratio: 3.4 ratio (ref 0.0–4.4)
Cholesterol, Total: 151 mg/dL (ref 100–199)
HDL: 44 mg/dL (ref >39)
LDL Chol Calc (NIH): 76 mg/dL (ref 0–99)
Triglycerides: 183 mg/dL — ABNORMAL HIGH (ref 0–149)
VLDL Cholesterol Cal: 31 mg/dL (ref 5–40)

## 2019-11-22 LAB — TSH: TSH: 2.34 u[IU]/mL (ref 0.450–4.500)

## 2019-11-22 LAB — MAGNESIUM: Magnesium: 2 mg/dL (ref 1.6–2.3)

## 2019-11-26 LAB — HEPATITIS PANEL, ACUTE
Hep A IgM: NEGATIVE
Hep B C IgM: NEGATIVE
Hep C Virus Ab: <0.1 s/co ratio (ref 0.0–0.9)
Hepatitis B Surface Ag: NEGATIVE

## 2019-11-26 LAB — SPECIMEN STATUS REPORT

## 2019-12-02 ENCOUNTER — Ambulatory Visit
Admission: RE | Admit: 2019-12-02 | Discharge: 2019-12-02 | Disposition: A | Discharge status: Home / Self Care | Payer: 59 | Source: Ambulatory Visit | Attending: Internal Medicine | Admitting: Internal Medicine

## 2019-12-02 ENCOUNTER — Other Ambulatory Visit: Payer: Self-pay

## 2019-12-02 DIAGNOSIS — Z1231 Encounter for screening mammogram for malignant neoplasm of breast: Secondary | ICD-10-CM | POA: Diagnosis present

## 2019-12-23 ENCOUNTER — Telehealth: Payer: Self-pay

## 2019-12-23 ENCOUNTER — Other Ambulatory Visit: Payer: Self-pay | Admitting: Internal Medicine

## 2019-12-23 DIAGNOSIS — E118 Type 2 diabetes mellitus with unspecified complications: Secondary | ICD-10-CM

## 2019-12-23 MED ORDER — FARXIGA 10 MG PO TABS: 10.0000 mg | ORAL_TABLET | Freq: Every day | ORAL | 0 refills | Status: DC

## 2019-12-23 NOTE — Telephone Encounter (Signed)
Patient called and left Vm saying she just finished the 1 month of samples given for Farxiga. She said her FASTING BS is still over 200. She feels even though she has not had any side effects, that the medication is not enough to keep her BS down.  Wants advise on what to do? She said if you want her to continue the farxiga to call in RX to her pharmacy.  Please advise.  CM

## 2019-12-23 NOTE — Telephone Encounter (Signed)
I think she should continue Farxiga until her next appointment.  I will send in the Rx.

## 2019-12-23 NOTE — Telephone Encounter (Signed)
Patient informed and will pick up meds and take until next visit.  CM

## 2019-12-26 ENCOUNTER — Encounter: Payer: Self-pay | Admitting: Internal Medicine

## 2020-01-19 ENCOUNTER — Ambulatory Visit (INDEPENDENT_AMBULATORY_CARE_PROVIDER_SITE_OTHER): Payer: 59

## 2020-01-19 ENCOUNTER — Other Ambulatory Visit: Payer: Self-pay

## 2020-01-19 DIAGNOSIS — Z23 Encounter for immunization: Secondary | ICD-10-CM | POA: Diagnosis not present

## 2020-02-02 DIAGNOSIS — G4733 Obstructive sleep apnea (adult) (pediatric): Secondary | ICD-10-CM | POA: Insufficient documentation

## 2020-03-17 ENCOUNTER — Ambulatory Visit: Payer: 59 | Admitting: Internal Medicine

## 2020-03-21 ENCOUNTER — Other Ambulatory Visit: Payer: Self-pay | Admitting: Internal Medicine

## 2020-03-21 DIAGNOSIS — E118 Type 2 diabetes mellitus with unspecified complications: Secondary | ICD-10-CM

## 2020-04-08 ENCOUNTER — Other Ambulatory Visit: Payer: Self-pay | Admitting: Internal Medicine

## 2020-04-08 DIAGNOSIS — F418 Other specified anxiety disorders: Secondary | ICD-10-CM

## 2020-04-10 ENCOUNTER — Other Ambulatory Visit: Payer: Self-pay | Admitting: Internal Medicine

## 2020-04-10 DIAGNOSIS — E118 Type 2 diabetes mellitus with unspecified complications: Secondary | ICD-10-CM

## 2020-04-10 NOTE — Telephone Encounter (Signed)
Requested Prescriptions  Pending Prescriptions Disp Refills   JANUVIA 100 MG tablet [Pharmacy Med Name: JANUVIA 100 MG TABLET] 90 tablet 0    Sig: TAKE 1 TABLET BY Casey DAY     Endocrinology:  Diabetes - DPP-4 Inhibitors Failed - 04/10/2020  9:49 AM      Failed - Cr in normal range and within 360 days    Creatinine, Ser  Date Value Ref Range Status  11/21/2019 1.06 (H) 0.57 - 1.00 mg/dL Final         Passed - HBA1C is between 0 and 7.9 and within 180 days    Hgb A1c MFr Bld  Date Value Ref Range Status  11/21/2019 7.7 (H) 4.8 - 5.6 % Final    Comment:             Prediabetes: 5.7 - 6.4          Diabetes: >6.4          Glycemic control for adults with diabetes: <7.0          Passed - Valid encounter within last 6 months    Recent Outpatient Visits          4 months ago Annual physical exam   [facility] Glean Hess, MD   5 months ago Depression with anxiety   [facility] Glean Hess, MD   7 months ago Type II diabetes mellitus with complication [facility])   [facility] Glean Hess, MD   8 months ago Hyperlipidemia associated with type 2 diabetes mellitus [facility])   [facility] Glean Hess, MD   1 year ago Type II diabetes mellitus with complication [facility])   [facility] Glean Hess, MD      Future Appointments            In 2 weeks [facility] Melia Jesse Sans, MD [facility], [facility]

## 2020-04-26 ENCOUNTER — Ambulatory Visit: Payer: 59 | Admitting: Internal Medicine

## 2020-06-03 ENCOUNTER — Ambulatory Visit (INDEPENDENT_AMBULATORY_CARE_PROVIDER_SITE_OTHER): Payer: 59 | Admitting: Internal Medicine

## 2020-06-03 ENCOUNTER — Other Ambulatory Visit
Admission: RE | Admit: 2020-06-03 | Discharge: 2020-06-03 | Disposition: A | Discharge status: Home / Self Care | Payer: 59 | Attending: Internal Medicine | Admitting: Internal Medicine

## 2020-06-03 ENCOUNTER — Other Ambulatory Visit: Payer: Self-pay

## 2020-06-03 ENCOUNTER — Encounter: Payer: Self-pay | Admitting: Internal Medicine

## 2020-06-03 VITALS — BP 122/74 | HR 65 | Temp 97.6°F | Ht 65.0 in | Wt 309.0 lb

## 2020-06-03 DIAGNOSIS — R7989 Other specified abnormal findings of blood chemistry: Secondary | ICD-10-CM | POA: Insufficient documentation

## 2020-06-03 DIAGNOSIS — E118 Type 2 diabetes mellitus with unspecified complications: Secondary | ICD-10-CM | POA: Diagnosis not present

## 2020-06-03 DIAGNOSIS — Z7984 Long term (current) use of oral hypoglycemic drugs: Secondary | ICD-10-CM | POA: Insufficient documentation

## 2020-06-03 DIAGNOSIS — Z23 Encounter for immunization: Secondary | ICD-10-CM

## 2020-06-03 DIAGNOSIS — F418 Other specified anxiety disorders: Secondary | ICD-10-CM

## 2020-06-03 LAB — LIPID PANEL
Cholesterol: 209 mg/dL — ABNORMAL HIGH (ref 0–200)
HDL: 51 mg/dL (ref >40)
LDL Cholesterol: 98 mg/dL (ref 0–99)
Total CHOL/HDL Ratio: 4.1 RATIO
Triglycerides: 298 mg/dL — ABNORMAL HIGH (ref <150)
VLDL: 60 mg/dL — ABNORMAL HIGH (ref 0–40)

## 2020-06-03 LAB — COMPREHENSIVE METABOLIC PANEL
ALT: 44 U/L (ref 0–44)
AST: 40 U/L (ref 15–41)
Albumin: 4 g/dL (ref 3.5–5.0)
Alkaline Phosphatase: 61 U/L (ref 38–126)
Anion gap: 10 (ref 5–15)
BUN: 16 mg/dL (ref 6–20)
CO2: 25 mmol/L (ref 22–32)
Calcium: 8.9 mg/dL (ref 8.9–10.3)
Chloride: 99 mmol/L (ref 98–111)
Creatinine, Ser: 0.94 mg/dL (ref 0.44–1.00)
GFR calc Af Amer: >60 mL/min (ref >60)
GFR calc non Af Amer: >60 mL/min (ref >60)
Glucose, Bld: 269 mg/dL — ABNORMAL HIGH (ref 70–99)
Potassium: 4.4 mmol/L (ref 3.5–5.1)
Sodium: 134 mmol/L — ABNORMAL LOW (ref 135–145)
Total Bilirubin: 0.8 mg/dL (ref 0.3–1.2)
Total Protein: 7.8 g/dL (ref 6.5–8.1)

## 2020-06-03 LAB — HEMOGLOBIN A1C
Hgb A1c MFr Bld: 9.6 % — ABNORMAL HIGH (ref 4.8–5.6)
Mean Plasma Glucose: 228.82 mg/dL

## 2020-06-03 MED ORDER — SITAGLIPTIN PHOSPHATE 100 MG PO TABS: 100.0000 mg | ORAL_TABLET | Freq: Every day | ORAL | 1 refills | Status: DC

## 2020-06-03 MED ORDER — ESCITALOPRAM OXALATE 10 MG PO TABS: 10.0000 mg | ORAL_TABLET | Freq: Every day | ORAL | 1 refills | Status: DC

## 2020-06-03 NOTE — Progress Notes (Signed)
Date:  06/03/2020   Name:  Mary Macias   DOB:  1966-04-25   MRN:  [id number]   Chief Complaint: Diabetes (follow up/ flu shot )  Diabetes She presents for her follow-up diabetic visit. She has type 2 diabetes mellitus. Her disease course has been stable. Pertinent negatives for hypoglycemia include no dizziness, headaches, nervousness/anxiousness or tremors. Pertinent negatives for diabetes include no chest pain, no fatigue, no polydipsia and no polyuria. Symptoms are stable. Her weight is decreasing steadily. There is no compliance with monitoring of blood glucose. An ACE inhibitor/angiotensin II receptor blocker is not being taken. Eye exam is current.  Palpitations  This is a recurrent problem. The problem occurs intermittently. The problem has been gradually improving. Nothing aggravates the symptoms. Pertinent negatives include no anxiety, chest pain, coughing, dizziness, fever, nausea, numbness or shortness of breath. Risk factors include diabetes mellitus, dyslipidemia and obesity. evaluated by cardiology - no treatment recommended    Lab Results  Component Value Date   CREATININE 1.06 (H) 11/21/2019   BUN 12 11/21/2019   NA 140 11/21/2019   K 4.4 11/21/2019   CL 99 11/21/2019   CO2 23 11/21/2019   Lab Results  Component Value Date   CHOL 151 11/21/2019   HDL 44 11/21/2019   LDLCALC 76 11/21/2019   TRIG 183 (H) 11/21/2019   CHOLHDL 3.4 11/21/2019   Lab Results  Component Value Date   TSH 2.340 11/21/2019   Lab Results  Component Value Date   HGBA1C 7.7 (H) 11/21/2019   Lab Results  Component Value Date   WBC 7.7 11/21/2019   HGB 14.6 11/21/2019   HCT 44.8 11/21/2019   MCV 94 11/21/2019   PLT 323 11/21/2019   Lab Results  Component Value Date   ALT 39 (H) 11/21/2019   AST 56 (H) 11/21/2019   ALKPHOS 74 11/21/2019   BILITOT 0.5 11/21/2019     Review of Systems  Constitutional: Negative for appetite change, fatigue, fever and unexpected weight  change.  HENT: Negative for tinnitus and trouble swallowing.   Eyes: Negative for visual disturbance.  Respiratory: Negative for cough, chest tightness and shortness of breath.   Cardiovascular: Positive for palpitations. Negative for chest pain and leg swelling.  Gastrointestinal: Negative for abdominal pain, diarrhea and nausea.  Endocrine: Negative for polydipsia and polyuria.  Genitourinary: Negative for dysuria and hematuria.  Musculoskeletal: Negative for arthralgias.  Skin: Negative for wound.  Neurological: Negative for dizziness, tremors, numbness and headaches.  Psychiatric/Behavioral: Negative for dysphoric mood and sleep disturbance. The patient is not nervous/anxious.        Some recent stresses with mother's illnesses and recurrent hospitalizations    Patient Active Problem List   Diagnosis Date Noted  . Elevated liver function tests 11/22/2019  . Depression with anxiety 08/07/2019  . Peripheral edema 02/08/2018  . BMI 50.0-59.9, adult ([place]) 01/25/2018  . Colon polyp, hyperplastic 07/16/2017  . Type II diabetes mellitus with complication ([place]) 95/62/1308  . Hyperlipidemia associated with type 2 diabetes mellitus ([place]) 07/08/2015  . Environmental and seasonal allergies 07/08/2015  . Avitaminosis D 07/08/2015  . Beat, premature ventricular 02/11/2015    Allergies  Allergen Reactions  . Atorvastatin Other (See Comments)    Severe body aches  . Meloxicam Other (See Comments)    Kidney function reduced   . Povidone Iodine Rash    Other Reaction: Other reaction  . Cephalexin Rash  . Metformin And Related Diarrhea  . Peanut-Containing Drug Products Rash  .  Trulicity [Dulaglutide]     Abdominal discomfort, flatus    Past Surgical History:  Procedure Laterality Date  . ABDOMINAL HYSTERECTOMY    . CHOLECYSTECTOMY    . COLONOSCOPY WITH PROPOFOL N/A 07/13/2017   Procedure: COLONOSCOPY WITH PROPOFOL;  Surgeon: Lollie Sails, MD;  [facility] END[facility];   Service: Endoscopy;  Laterality: N/A;  . NASAL SINUS SURGERY    . OOPHORECTOMY    . TOTAL VAGINAL HYSTERECTOMY    . TUBAL LIGATION      Social History   Tobacco Use  . Smoking status: Never Smoker  . Smokeless tobacco: Never Used  Vaping Use  . Vaping Use: Never used  Substance Use Topics  . Alcohol use: No    Alcohol/week: 0.0 standard drinks  . Drug use: No     Medication list has been reviewed and updated.  Current Meds  Medication Sig  . acetaminophen (TYLENOL) 650 MG CR tablet Take 650 mg by mouth every 8 (eight) hours as needed for pain.  Marland Kitchen acyclovir ointment (ZOVIRAX) 5 % Apply 1 application topically every 3 (three) hours.  Marland Kitchen escitalopram (LEXAPRO) 10 MG tablet TAKE 1 TABLET BY MOUTH EVERY DAY  . FARXIGA 10 MG TABS tablet TAKE 1 TABLET BY MOUTH DAILY BEFORE BREAKFAST.  . fexofenadine (ALLEGRA) 180 MG tablet Take 180 mg by mouth as needed.   Marland Kitchen JANUVIA 100 MG tablet TAKE 1 TABLET BY MOUTH EVERY DAY  . mupirocin nasal ointment (BACTROBAN) 2 % Place 1 application into the nose 2 (two) times daily. (Patient taking differently: Place 1 application into the nose as needed. )  . nystatin cream (MYCOSTATIN) Apply 1 application topically 2 (two) times daily. (Patient taking differently: Apply 1 application topically as needed. )  . pravastatin (PRAVACHOL) 40 MG tablet Take 40 mg by mouth daily.  . Probiotic Product (PROBIOTIC-10 PO) Take by mouth.  . triamcinolone (NASACORT ALLERGY 24HR) 55 MCG/ACT AERO nasal inhaler Place 2 sprays into the nose as needed.   . valACYclovir (VALTREX) 1000 MG tablet Take by mouth as needed.   Marland Kitchen VITAMIN D PO Take 1 tablet by mouth daily.    PHQ 2/9 Scores 06/03/2020 11/17/2019 10/22/2019 08/20/2019  PHQ - 2 Score 2 0 0 2  PHQ- 9 Score 3 0 2 5    GAD 7 : Generalized Anxiety Score 06/03/2020 10/22/2019 08/07/2019  Nervous, Anxious, on Edge 1 3 3   Control/stop worrying 1 2 3   Worry too much - different things 1 2 3   Trouble relaxing 1 2 2     Restless 0 0 1  Easily annoyed or irritable 3 1 3   Afraid - awful might happen 0 0 1  Total GAD 7 Score 7 10 16   Anxiety Difficulty Not difficult at all Somewhat difficult Somewhat difficult    BP Readings from Last 3 Encounters:  06/03/20 122/74  11/17/19 140/80  10/22/19 134/78    Physical Exam Vitals and nursing note reviewed.  Constitutional:      General: She is not in acute distress.    Appearance: She is well-developed. She is obese.  HENT:     Head: Normocephalic and atraumatic.  Cardiovascular:     Rate and Rhythm: Normal rate and regular rhythm.     Pulses: Normal pulses.     Heart sounds: No murmur heard.   Pulmonary:     Effort: Pulmonary effort is normal. No respiratory distress.     Breath sounds: No wheezing or rhonchi.  Musculoskeletal:  Cervical back: Normal range of motion.     Right lower leg: No edema.     Left lower leg: No edema.  Lymphadenopathy:     Cervical: No cervical adenopathy.  Skin:    General: Skin is warm and dry.     Capillary Refill: Capillary refill takes less than 2 seconds.     Findings: No rash.  Neurological:     Mental Status: She is alert and oriented to person, place, and time.  Psychiatric:        Behavior: Behavior normal.        Thought Content: Thought content normal.     Wt Readings from Last 3 Encounters:  06/03/20 (!) 309 lb (140.2 kg)  11/17/19 (!) 324 lb (147 kg)  10/22/19 (!) 322 lb (146.1 kg)    BP 122/74   Pulse 65   Temp 97.6 F (36.4 C) (Oral)   Ht 5\' 5"  (1.651 m)   Wt (!) 309 lb (140.2 kg)   SpO2 97%   BMI 51.42 kg/m   Assessment and Plan: 1. Type II diabetes mellitus with complication (HCC) Clinically stable by exam and report without s/s of hypoglycemia. DM complicated by obesity. Tolerating medications januvia and farxiga  well without side effects or other concerns. Last A1C was improving.  She has lost more weight - hopefully as a result of the medication rather than uncontrolled BS  (pt does not check BS). - sitaGLIPtin (JANUVIA) 100 MG tablet; Take 1 tablet (100 mg total) by mouth daily.  Dispense: 90 tablet; Refill: 1 - Hemoglobin A1c - Lipid panel  2. Elevated liver function tests Continue to monitor Hepatitis serologies negative; consider hepatic imaging - Comprehensive metabolic panel  3. Depression with anxiety Clinically stable on current regimen with good control of symptoms, No SI or HI. Will continue current therapy.  Doing well in light of recent family health issues. - escitalopram (LEXAPRO) 10 MG tablet; Take 1 tablet (10 mg total) by mouth daily.  Dispense: 90 tablet; Refill: 1   Partially dictated using Editor, commissioning. Any errors are unintentional.  Halina Maidens, MD [facility]  06/03/2020

## 2020-06-04 ENCOUNTER — Other Ambulatory Visit: Payer: Self-pay | Admitting: Internal Medicine

## 2020-06-04 ENCOUNTER — Telehealth: Payer: Self-pay

## 2020-06-04 DIAGNOSIS — E1169 Type 2 diabetes mellitus with other specified complication: Secondary | ICD-10-CM

## 2020-06-04 MED ORDER — ICOSAPENT ETHYL 1 G PO CAPS: 2.0000 g | ORAL_CAPSULE | Freq: Two times a day (BID) | ORAL | 5 refills | Status: DC

## 2020-06-04 NOTE — Progress Notes (Signed)
Please Advise. Pt viewed labs on Mychart. Called pt about starting insulin and starting Vascepa. Pt said her eating habits hasn't been good she wants to try to eat better and see how her A1C is. Pt wants to hold off for about 3 months. She does want to start Vascepa.

## 2020-06-04 NOTE — Telephone Encounter (Signed)
Sent in [place] for [place] waiting for insurance to approve.  (Key: [id number])  KP

## 2020-06-07 ENCOUNTER — Telehealth: Payer: Self-pay | Admitting: Internal Medicine

## 2020-06-07 ENCOUNTER — Other Ambulatory Visit: Payer: Self-pay

## 2020-06-07 MED ORDER — EPINEPHRINE 0.3 MG/0.3ML IJ SOAJ: 0.3000 mg | INTRAMUSCULAR | 1 refills | Status: DC | PRN

## 2020-06-07 NOTE — Telephone Encounter (Signed)
Medication Refill - Medication: Epi-pen  Has the patient contacted their pharmacy? No. (Agent: If no, request that the patient contact the pharmacy for the refill.) (Agent: If yes, when and what did the pharmacy advise?)  Preferred Pharmacy (with phone number or street name): [id number] - [place], [place]: Please be advised that RX refills may take up to 3 business days. We ask that you follow-up with your pharmacy.

## 2020-06-10 ENCOUNTER — Telehealth: Payer: Self-pay | Admitting: Internal Medicine

## 2020-06-10 NOTE — Telephone Encounter (Signed)
Pt is calling and would like a new rx for a continuing glucose monitor. Pt will leave the brand etc up to dr berglund. [facility] [place] [address]

## 2020-06-11 ENCOUNTER — Other Ambulatory Visit: Payer: Self-pay

## 2020-06-11 MED ORDER — BLOOD GLUCOSE METER KIT: PACK | 0 refills | Status: DC

## 2020-06-28 ENCOUNTER — Telehealth: Payer: Self-pay

## 2020-06-28 NOTE — Telephone Encounter (Signed)
Completed PA on [email or web address] for Vascepa 1mg  capsule.  ([employer]lo Shanks) Rx #: [id number]  Awaiting outcome from insurance company.  CM

## 2020-06-30 NOTE — Telephone Encounter (Signed)
Received FAX from [facility] to answer more clinical questions. Refaxed those back to the patient insurance company.  CM

## 2020-07-01 NOTE — Telephone Encounter (Signed)
Received FAX with APPROVAL from 06/29/20 to 06/30/2021.  Sent my chart message to also inform the patient.   CM

## 2020-07-01 NOTE — Telephone Encounter (Signed)
Insurance has approved medication.   Took a while to get approved  CM helped work on this PA.  KP

## 2020-07-05 ENCOUNTER — Other Ambulatory Visit: Payer: Self-pay | Admitting: Internal Medicine

## 2020-09-04 ENCOUNTER — Other Ambulatory Visit: Payer: Self-pay

## 2020-09-04 ENCOUNTER — Ambulatory Visit
Admission: EM | Admit: 2020-09-04 | Discharge: 2020-09-04 | Disposition: A | Discharge status: Home / Self Care | Payer: 59 | Attending: Internal Medicine | Admitting: Internal Medicine

## 2020-09-04 ENCOUNTER — Encounter: Payer: Self-pay | Admitting: Emergency Medicine

## 2020-09-04 DIAGNOSIS — J322 Chronic ethmoidal sinusitis: Secondary | ICD-10-CM | POA: Diagnosis not present

## 2020-09-04 MED ORDER — LEVOFLOXACIN 500 MG PO TABS: 500.0000 mg | ORAL_TABLET | Freq: Every day | ORAL | 0 refills | Status: DC

## 2020-09-04 NOTE — ED Triage Notes (Signed)
Pt c/o sinus pain and sinus drainage x 10 days. She states she has had green mucus with some blood in it. Pt states she does have a dry cough.

## 2020-09-04 NOTE — ED Provider Notes (Signed)
[facility]    CSN: [id number] Arrival date & time: 09/04/20  0805      History   Chief Complaint Chief Complaint  Patient presents with  . Sinus Problem    HPI Mary Macias is a 54 y.o. female who presents with nose congestion and PND x 10 days. She is having green mucous with blood. She also has a non productive cough. Has had ethmoid surgery to open this areas few years ago. She has been out of town and not done saline rinses or used the Rx nose spray. Denies aches. Had a few loose stools 2 days ago and night sweats that night. The last time she had sinusitis she did not respond to Augmentin and was placed on Levaquin which helped resolve the infection.      Past Medical History:  Diagnosis Date  . Allergy   . Diabetes mellitus without complication ([place])   . Sleep apnea    cpap  . Stable angina pectoris ([place]) 08/10/2017    Patient Active Problem List   Diagnosis Date Noted  . Elevated liver function tests 11/22/2019  . Depression with anxiety 08/07/2019  . Peripheral edema 02/08/2018  . BMI 50.0-59.9, adult ([place]) 01/25/2018  . Colon polyp, hyperplastic 07/16/2017  . Type II diabetes mellitus with complication ([place]) 86/76/7209  . Hyperlipidemia associated with type 2 diabetes mellitus ([place]) 07/08/2015  . Environmental and seasonal allergies 07/08/2015  . Avitaminosis D 07/08/2015  . Beat, premature ventricular 02/11/2015    Past Surgical History:  Procedure Laterality Date  . ABDOMINAL HYSTERECTOMY    . CHOLECYSTECTOMY    . COLONOSCOPY WITH PROPOFOL N/A 07/13/2017   Procedure: COLONOSCOPY WITH PROPOFOL;  Surgeon: Lollie Sails, MD;  Location: [facility] ENDOSCOPY;  Service: Endoscopy;  Laterality: N/A;  . NASAL SINUS SURGERY    . OOPHORECTOMY    . TOTAL VAGINAL HYSTERECTOMY    . TUBAL LIGATION      OB History   No obstetric history on file.      Home Medications    Prior to Admission medications   Medication Sig Start Date End Date  Taking? Authorizing Provider  acetaminophen (TYLENOL) 650 MG CR tablet Take 650 mg by mouth every 8 (eight) hours as needed for pain.   Yes [provider]  acyclovir ointment (ZOVIRAX) 5 % Apply 1 application topically every 3 (three) hours. 08/25/16  Yes Glean Hess, MD  blood glucose meter kit and supplies Dispense based on patient and insurance preference. Use up to four times daily as directed. (FOR ICD-10 E10.9, E11.9). 06/11/20  Yes Glean Hess, MD  EPINEPHrine 0.3 mg/0.3 mL IJ SOAJ injection Inject 0.3 mLs (0.3 mg total) into the muscle as needed for anaphylaxis. 06/07/20  Yes Glean Hess, MD  escitalopram (LEXAPRO) 10 MG tablet Take 1 tablet (10 mg total) by mouth daily. 06/03/20  Yes Glean Hess, MD  FARXIGA 10 MG TABS tablet TAKE 1 TABLET BY MOUTH DAILY BEFORE BREAKFAST. 03/21/20  Yes Glean Hess, MD  fexofenadine (ALLEGRA) 180 MG tablet Take 180 mg by mouth as needed.    Yes [provider]  icosapent Ethyl (VASCEPA) 1 g capsule Take 2 capsules (2 g total) by mouth 2 (two) times daily. 06/04/20  Yes Glean Hess, MD  [facility] VERIO test strip USE UP TO 4 TIMES DAILY AS DIRECTED 07/05/20  Yes Glean Hess, MD  pravastatin (PRAVACHOL) 40 MG tablet Take 40 mg by mouth daily.  Yes [provider]  Probiotic Product (PROBIOTIC-10 PO) Take by mouth.   Yes [provider]  sitaGLIPtin (JANUVIA) 100 MG tablet Take 1 tablet (100 mg total) by mouth daily. 06/03/20  Yes Glean Hess, MD  triamcinolone (NASACORT ALLERGY 24HR) 55 MCG/ACT AERO nasal inhaler Place 2 sprays into the nose as needed.    Yes [provider]  valACYclovir (VALTREX) 1000 MG tablet Take by mouth as needed.  08/25/16  Yes [provider]  VITAMIN D PO Take 1 tablet by mouth daily.   Yes [provider]  mupirocin nasal ointment (BACTROBAN) 2 % Place 1 application into the nose 2 (two) times daily. Patient taking differently:  Place 1 application into the nose as needed.  07/26/18   Glean Hess, MD  nystatin cream (MYCOSTATIN) Apply 1 application topically 2 (two) times daily. Patient taking differently: Apply 1 application topically as needed.  11/12/18   Glean Hess, MD    Family History Family History  Problem Relation Age of Onset  . Breast cancer Paternal Grandmother   . Colon cancer Mother   . Hypertension Mother   . Diabetes Father   . Hypertension Father   . CAD Father   . Hypertension Sister     Social History Social History   Tobacco Use  . Smoking status: Never Smoker  . Smokeless tobacco: Never Used  Vaping Use  . Vaping Use: Never used  Substance Use Topics  . Alcohol use: No    Alcohol/week: 0.0 standard drinks  . Drug use: No     Allergies   Peanut-containing drug products, Atorvastatin, Meloxicam, Povidone iodine, Cephalexin, Metformin and related, and Trulicity [dulaglutide]   Review of Systems Review of Systems  Constitutional: Positive for diaphoresis. Negative for activity change, appetite change, chills, fatigue and fever.  HENT: Positive for congestion, postnasal drip, sinus pressure and sinus pain. Negative for ear discharge, ear pain, facial swelling and sore throat.   Eyes: Negative for discharge.  Respiratory: Positive for cough. Negative for chest tightness and shortness of breath.   Cardiovascular: Negative for chest pain.  Gastrointestinal: Positive for diarrhea. Negative for nausea and vomiting.  Musculoskeletal: Negative for myalgias.  Skin: Negative for rash.  Allergic/Immunologic: Positive for environmental allergies.  Neurological: Negative for headaches.  Hematological: Negative for adenopathy.   Physical Exam Triage Vital Signs ED Triage Vitals [09/04/20 0815]  Enc Vitals Group     BP (!) 152/77     Pulse Rate 65     Resp      Temp 97.7 F (36.5 C)     Temp Source Oral     SpO2 99 %     Weight      Height      Head Circumference        Peak Flow      Pain Score      Pain Loc      Pain Edu?      Excl. in [place]?    No data found.  Updated Vital Signs BP (!) 152/77 (BP Location: Left Arm)   Pulse 65   Temp 97.7 F (36.5 C) (Oral)   SpO2 99%   Visual Acuity Right Eye Distance:   Left Eye Distance:   Bilateral Distance:    Right Eye Near:   Left Eye Near:    Bilateral Near:     Physical Exam Physical Exam Vitals signs and nursing note reviewed.  Constitutional:      General:  She is not in acute distress.    Appearance: Normal appearance. She is not ill-appearing, toxic-appearing or diaphoretic.  HENT:     Head: Normocephalic.     Right Ear: Tympanic membrane, ear canal and external ear normal.     Left Ear: Tympanic membrane, ear canal and external ear normal.     Nose: with mild congestion, has tender  Bilateral ethmoid sinuses    Mouth/Throat: clear    Mouth: Mucous membranes are moist.  Eyes:     General: No scleral icterus.       Right eye: No discharge.        Left eye: No discharge.     Conjunctiva/sclera: Conjunctivae normal.  Neck:     Musculoskeletal: Neck supple. No neck rigidity.  Cardiovascular:     Rate and Rhythm: Normal rate and regular rhythm.     Heart sounds: No murmur.  Pulmonary:     Effort: Pulmonary effort is normal.     Breath sounds: Normal breath sounds.   Musculoskeletal: Normal range of motion.  Lymphadenopathy:     Cervical: No cervical adenopathy.  Skin:    General: Skin is warm and dry.     Coloration: Skin is not jaundiced.     Findings: No rash.  Neurological:     Mental Status: She is alert and oriented to person, place, and time.     Gait: Gait normal.  Psychiatric:        Mood and Affect: Mood normal.        Behavior: Behavior normal.        Thought Content: Thought content normal.        Judgment: Judgment normal.     UC Treatments / Results  Labs (all labs ordered are listed, but only abnormal results are displayed) Labs Reviewed - No data to  display  EKG   Radiology No results found.  Procedures Procedures (including critical care time)  Medications Ordered in UC Medications - No data to display  Initial Impression / Assessment and Plan / UC Course  I have reviewed the triage vital signs and the nursing notes. Has ethmoid sinusitis and due to her hx I placed her on Levaquin as noted. Advised to do saline rinses.  Final Clinical Impressions(s) / UC Diagnoses   Final diagnoses:  None   Discharge Instructions   None    ED Prescriptions    None     PDMP not reviewed this encounter.   Shelby Mattocks, PA-C 09/04/20 0840

## 2020-09-04 NOTE — Discharge Instructions (Signed)
Watch this video who to do saline nose rinses.  [email or web address]   Do not use tap water, only boiled water that has been cooled down.  Do it twice a day  for 5-7 days, but avoid bed time.

## 2020-09-28 ENCOUNTER — Other Ambulatory Visit: Payer: Self-pay | Admitting: Orthopedic Surgery

## 2020-09-28 DIAGNOSIS — M25311 Other instability, right shoulder: Secondary | ICD-10-CM

## 2020-09-29 ENCOUNTER — Encounter: Payer: Self-pay | Admitting: Internal Medicine

## 2020-09-29 ENCOUNTER — Other Ambulatory Visit: Payer: Self-pay

## 2020-09-29 ENCOUNTER — Ambulatory Visit (INDEPENDENT_AMBULATORY_CARE_PROVIDER_SITE_OTHER): Payer: 59 | Admitting: Internal Medicine

## 2020-09-29 VITALS — BP 122/82 | HR 78 | Temp 98.0°F | Ht 65.0 in | Wt 310.0 lb

## 2020-09-29 DIAGNOSIS — M62830 Muscle spasm of back: Secondary | ICD-10-CM

## 2020-09-29 DIAGNOSIS — E118 Type 2 diabetes mellitus with unspecified complications: Secondary | ICD-10-CM

## 2020-09-29 DIAGNOSIS — A084 Viral intestinal infection, unspecified: Secondary | ICD-10-CM | POA: Diagnosis not present

## 2020-09-29 MED ORDER — METHOCARBAMOL 500 MG PO TABS: 500.0000 mg | ORAL_TABLET | Freq: Four times a day (QID) | ORAL | 0 refills | Status: DC

## 2020-09-29 MED ORDER — FREESTYLE LIBRE 14 DAY SENSOR MISC: 1.0000 | 5 refills | Status: DC

## 2020-09-29 MED ORDER — FREESTYLE LIBRE 14 DAY READER DEVI: 1.0000 | Freq: Every day | 0 refills | Status: DC

## 2020-09-29 NOTE — Progress Notes (Signed)
Date:  09/29/2020   Name:  Mary Macias   DOB:  01/14/1966   MRN:  [id number]   Chief Complaint: Back Pain (X1.5 month has got worse over the past week. Mid back, pain comes and goes, shooting pain that radiates to side)  Back Pain This is a new problem. The current episode started 1 to 4 weeks ago. The problem occurs daily. The problem has been gradually worsening since onset. The pain is present in the lumbar spine. The quality of the pain is described as cramping. The pain does not radiate. The pain is mild. The symptoms are aggravated by twisting. Associated symptoms include abdominal pain (epigastric - now resolved). Pertinent negatives include no bladder incontinence, bowel incontinence, chest pain, dysuria, fever, headaches, leg pain, numbness, paresthesias or weakness. She has tried heat and NSAIDs for the symptoms. The treatment provided mild relief.  Diabetes She presents for her follow-up diabetic visit. She has type 2 diabetes mellitus. Her disease course has been stable. Pertinent negatives for hypoglycemia include no dizziness, headaches or tremors. Pertinent negatives for diabetes include no chest pain, no fatigue, no polydipsia, no polyuria and no weakness. Symptoms are stable. Her weight is stable. When asked about meal planning, she reported none. Home blood sugar record trend: she wants to get a Freestyle Libre to help her track her glucoses and improve her diet.    Lab Results  Component Value Date   CREATININE 0.94 06/03/2020   BUN 16 06/03/2020   NA 134 (L) 06/03/2020   K 4.4 06/03/2020   CL 99 06/03/2020   CO2 25 06/03/2020   Lab Results  Component Value Date   CHOL 209 (H) 06/03/2020   HDL 51 06/03/2020   LDLCALC 98 06/03/2020   TRIG 298 (H) 06/03/2020   CHOLHDL 4.1 06/03/2020   Lab Results  Component Value Date   TSH 2.340 11/21/2019   Lab Results  Component Value Date   HGBA1C 9.6 (H) 06/03/2020   Lab Results  Component Value Date   WBC 7.7  11/21/2019   HGB 14.6 11/21/2019   HCT 44.8 11/21/2019   MCV 94 11/21/2019   PLT 323 11/21/2019   Lab Results  Component Value Date   ALT 44 06/03/2020   AST 40 06/03/2020   ALKPHOS 61 06/03/2020   BILITOT 0.8 06/03/2020     Review of Systems  Constitutional: Negative for appetite change, fatigue, fever and unexpected weight change.  HENT: Negative for tinnitus and trouble swallowing.   Eyes: Negative for visual disturbance.  Respiratory: Negative for cough, chest tightness and shortness of breath.   Cardiovascular: Negative for chest pain, palpitations and leg swelling.  Gastrointestinal: Positive for abdominal pain (epigastric - now resolved) and diarrhea (resolved). Negative for bowel incontinence and constipation.  Endocrine: Negative for polydipsia and polyuria.  Genitourinary: Negative for bladder incontinence, dysuria and hematuria.  Musculoskeletal: Positive for back pain and myalgias. Negative for arthralgias.  Neurological: Negative for dizziness, tremors, weakness, numbness, headaches and paresthesias.  Psychiatric/Behavioral: Negative for dysphoric mood.    Patient Active Problem List   Diagnosis Date Noted  . Elevated liver function tests 11/22/2019  . Depression with anxiety 08/07/2019  . Peripheral edema 02/08/2018  . BMI 50.0-59.9, adult ([place]) 01/25/2018  . Colon polyp, hyperplastic 07/16/2017  . Type II diabetes mellitus with complication ([place]) 61/68/3729  . Hyperlipidemia associated with type 2 diabetes mellitus ([place]) 07/08/2015  . Environmental and seasonal allergies 07/08/2015  . Avitaminosis D 07/08/2015  . Beat, premature  ventricular 02/11/2015    Allergies  Allergen Reactions  . Peanut-Containing Drug Products Hives and Swelling    Uses EPI-PEN As Needed for this.  . Atorvastatin Other (See Comments)    Severe body aches  . Meloxicam Other (See Comments)    Kidney function reduced   . Povidone Iodine Rash    Other Reaction: Other reaction  .  Cephalexin Rash  . Metformin And Related Diarrhea  . Trulicity [Dulaglutide]     Abdominal discomfort, flatus    Past Surgical History:  Procedure Laterality Date  . ABDOMINAL HYSTERECTOMY    . CHOLECYSTECTOMY    . COLONOSCOPY WITH PROPOFOL N/A 07/13/2017   Procedure: COLONOSCOPY WITH PROPOFOL;  Surgeon: Lollie Sails, MD;  Location: [facility] ENDOSCOPY;  Service: Endoscopy;  Laterality: N/A;  . NASAL SINUS SURGERY    . OOPHORECTOMY    . TOTAL VAGINAL HYSTERECTOMY    . TUBAL LIGATION      Social History   Tobacco Use  . Smoking status: Never Smoker  . Smokeless tobacco: Never Used  Vaping Use  . Vaping Use: Never used  Substance Use Topics  . Alcohol use: No    Alcohol/week: 0.0 standard drinks  . Drug use: No     Medication list has been reviewed and updated.  Current Meds  Medication Sig  . acetaminophen (TYLENOL) 650 MG CR tablet Take 650 mg by mouth every 8 (eight) hours as needed for pain.  Marland Kitchen acyclovir ointment (ZOVIRAX) 5 % Apply 1 application topically every 3 (three) hours.  . blood glucose meter kit and supplies Dispense based on patient and insurance preference. Use up to four times daily as directed. (FOR ICD-10 E10.9, E11.9).  Marland Kitchen EPINEPHrine 0.3 mg/0.3 mL IJ SOAJ injection Inject 0.3 mLs (0.3 mg total) into the muscle as needed for anaphylaxis.  Marland Kitchen escitalopram (LEXAPRO) 10 MG tablet Take 1 tablet (10 mg total) by mouth daily.  Marland Kitchen FARXIGA 10 MG TABS tablet TAKE 1 TABLET BY MOUTH DAILY BEFORE BREAKFAST.  . fexofenadine (ALLEGRA) 180 MG tablet Take 180 mg by mouth as needed.   Marland Kitchen icosapent Ethyl (VASCEPA) 1 g capsule Take 2 capsules (2 g total) by mouth 2 (two) times daily.  . mupirocin nasal ointment (BACTROBAN) 2 % Place 1 application into the nose 2 (two) times daily. (Patient taking differently: Place 1 application into the nose as needed.)  . nystatin cream (MYCOSTATIN) Apply 1 application topically 2 (two) times daily. (Patient taking differently: Apply 1  application topically as needed.)  . ONETOUCH VERIO test strip USE UP TO 4 TIMES DAILY AS DIRECTED  . pravastatin (PRAVACHOL) 40 MG tablet Take 40 mg by mouth daily.  . Probiotic Product (PROBIOTIC-10 PO) Take by mouth.  . sitaGLIPtin (JANUVIA) 100 MG tablet Take 1 tablet (100 mg total) by mouth daily.  Marland Kitchen triamcinolone (NASACORT) 55 MCG/ACT AERO nasal inhaler Place 2 sprays into the nose as needed.   . valACYclovir (VALTREX) 1000 MG tablet Take by mouth as needed.   Marland Kitchen VITAMIN D PO Take 1 tablet by mouth daily.    PHQ 2/9 Scores 09/29/2020 06/03/2020 11/17/2019 10/22/2019  PHQ - 2 Score 0 2 0 0  PHQ- 9 Score 1 3 0 2    GAD 7 : Generalized Anxiety Score 09/29/2020 06/03/2020 10/22/2019 08/07/2019  Nervous, Anxious, on Edge 1 1 3 3   Control/stop worrying 1 1 2 3   Worry too much - different things 1 1 2 3   Trouble relaxing 0 1 2 2  Restless 0 0 0 1  Easily annoyed or irritable 0 3 1 3   Afraid - awful might happen 0 0 0 1  Total GAD 7 Score 3 7 10 16   Anxiety Difficulty - Not difficult at all Somewhat difficult Somewhat difficult    BP Readings from Last 3 Encounters:  09/29/20 122/82  09/04/20 (!) 152/77  06/03/20 122/74    Physical Exam Vitals and nursing note reviewed.  Constitutional:      General: She is not in acute distress.    Appearance: Normal appearance. She is well-developed.  HENT:     Head: Normocephalic and atraumatic.  Cardiovascular:     Rate and Rhythm: Normal rate and regular rhythm.     Pulses: Normal pulses.  Pulmonary:     Effort: Pulmonary effort is normal. No respiratory distress.     Breath sounds: No wheezing or rhonchi.  Musculoskeletal:     Lumbar back: Spasms and tenderness present. Normal range of motion. Negative right straight leg raise test and negative left straight leg raise test.       Back:     Comments: Muscle spasm and tenderness  Skin:    General: Skin is warm and dry.     Findings: No rash.  Neurological:     Mental Status: She is  alert and oriented to person, place, and time.  Psychiatric:        Mood and Affect: Mood and affect normal.        Behavior: Behavior normal.        Thought Content: Thought content normal.     Wt Readings from Last 3 Encounters:  09/29/20 (!) 310 lb (140.6 kg)  06/03/20 (!) 309 lb (140.2 kg)  11/17/19 (!) 324 lb (147 kg)    BP 122/82   Pulse 78   Temp 98 F (36.7 C) (Oral)   Ht 5' 5"  (1.651 m)   Wt (!) 310 lb (140.6 kg)   SpO2 94%   BMI 51.59 kg/m   Assessment and Plan: 1. Back muscle spasm Continue heat/ice and tylenol Massage scheduled - methocarbamol (ROBAXIN) 500 MG tablet; Take 1 tablet (500 mg total) by mouth 4 (four) times daily.  Dispense: 120 tablet; Refill: 0  2. Type II diabetes mellitus with complication (HCC) BS uncontrolled.  Continue current medications. Work on diet - pt is planning to start Atkins Will see if she can afford Freestyle libre - Continuous Blood Gluc Receiver (FREESTYLE LIBRE 14 DAY READER) [place]; 1 each by Does not apply route 5 (five) times daily.  Dispense: 1 each; Refill: 0 - Continuous Blood Gluc Sensor (FREESTYLE LIBRE 14 DAY SENSOR) MISC; Apply 1 each topically every 14 (fourteen) days. Use to test BS 5 times+ per day  Dispense: 2 each; Refill: 5 - Hemoglobin A1c - Comprehensive metabolic panel  3. Viral gastroenteritis Now resolved   Partially dictated using Editor, commissioning. Any errors are unintentional.  Halina Maidens, MD [facility]  09/29/2020

## 2020-09-30 LAB — COMPREHENSIVE METABOLIC PANEL
ALT: 23 IU/L (ref 0–32)
AST: 22 IU/L (ref 0–40)
Albumin/Globulin Ratio: 1.4 (ref 1.2–2.2)
Albumin: 4.3 g/dL (ref 3.8–4.9)
Alkaline Phosphatase: 85 IU/L (ref 44–121)
BUN/Creatinine Ratio: 17 (ref 9–23)
BUN: 14 mg/dL (ref 6–24)
Bilirubin Total: 0.3 mg/dL (ref 0.0–1.2)
CO2: 24 mmol/L (ref 20–29)
Calcium: 9.3 mg/dL (ref 8.7–10.2)
Chloride: 96 mmol/L (ref 96–106)
Creatinine, Ser: 0.84 mg/dL (ref 0.57–1.00)
GFR calc Af Amer: 90 mL/min/{1.73_m2} (ref >59)
GFR calc non Af Amer: 78 mL/min/{1.73_m2} (ref >59)
Globulin, Total: 3 g/dL (ref 1.5–4.5)
Glucose: 173 mg/dL — ABNORMAL HIGH (ref 65–99)
Potassium: 4 mmol/L (ref 3.5–5.2)
Sodium: 138 mmol/L (ref 134–144)
Total Protein: 7.3 g/dL (ref 6.0–8.5)

## 2020-09-30 LAB — HEMOGLOBIN A1C
Est. average glucose Bld gHb Est-mCnc: 189 mg/dL
Hgb A1c MFr Bld: 8.2 % — ABNORMAL HIGH (ref 4.8–5.6)

## 2020-10-20 ENCOUNTER — Other Ambulatory Visit: Payer: 59

## 2020-11-08 ENCOUNTER — Other Ambulatory Visit: Payer: Self-pay | Admitting: Orthopedic Surgery

## 2020-11-12 ENCOUNTER — Encounter
Admission: RE | Admit: 2020-11-12 | Discharge: 2020-11-12 | Disposition: A | Discharge status: Home / Self Care | Payer: 59 | Source: Ambulatory Visit | Attending: Orthopedic Surgery | Admitting: Orthopedic Surgery

## 2020-11-12 ENCOUNTER — Other Ambulatory Visit: Payer: Self-pay

## 2020-11-12 HISTORY — DX: Other specified postprocedural states: Z98.890

## 2020-11-12 HISTORY — DX: Other complications of anesthesia, initial encounter: T88.59XA

## 2020-11-12 HISTORY — DX: Nausea with vomiting, unspecified: R11.2

## 2020-11-12 HISTORY — DX: Pneumonia, unspecified organism: J18.9

## 2020-11-12 NOTE — Patient Instructions (Addendum)
Your procedure is scheduled on: 11/22/20- MONDAY Report to the Registration Desk on the 1st floor of the [facility]. To find out your arrival time, please call [phone] between 1PM - 3PM on: 11/19/20- FRIDAY  REMEMBER: Instructions that are not followed completely may result in serious medical risk, up to and including death; or upon the discretion of your surgeon and anesthesiologist your surgery may need to be rescheduled.  Do not eat food after midnight the night before surgery.  No gum chewing, lozengers or hard candies.  You may however, drink CLEAR liquids up to 2 hours before you are scheduled to arrive for your surgery. Do not drink anything within 2 hours of your scheduled arrival time. Type 1 and Type 2 diabetics should only drink water.  TAKE THESE MEDICATIONS THE MORNING OF SURGERY WITH A SIP OF WATER: - escitalopram (LEXAPRO) 10 MG tablet  STOP one week before procedure: Stop Anti-inflammatories (NSAIDS) such as Advil, Aleve, Ibuprofen, Motrin, Naproxen, Naprosyn and Aspirin based products such as Excedrin, Goodys Powder, BC Powder.  Stop one week before procedure ANY OVER THE COUNTER supplements until after surgery. (However, you may continue taking Vitamin D, Vitamin B, and multivitamin up until the day before surgery.)  No Alcohol for 24 hours before or after surgery.  No Smoking including e-cigarettes for 24 hours prior to surgery.  No chewable tobacco products for at least 6 hours prior to surgery.  No nicotine patches on the day of surgery.  Do not use any "recreational" drugs for at least a week prior to your surgery.  Please be advised that the combination of cocaine and anesthesia may have negative outcomes, up to and including death. If you test positive for cocaine, your surgery will be cancelled.  On the morning of surgery brush your teeth with toothpaste and water, you may rinse your mouth with mouthwash if you wish. Do not swallow any toothpaste or  mouthwash.  Do not wear jewelry, make-up, hairpins, clips or nail polish.  Do not wear lotions, powders, or perfumes.   Do not shave body from the neck down 48 hours prior to surgery just in case you cut yourself which could leave a site for infection.  Also, freshly shaved skin may become irritated if using the CHG soap.  Contact lenses, hearing aids and dentures may not be worn into surgery.  Do not bring valuables to the hospital. [facility] is not responsible for any missing/lost belongings or valuables.   Use CHG Soap or wipes as directed on instruction sheet.  Bring your C-PAP to the hospital with you in case you may have to spend the night.   Notify your doctor if there is any change in your medical condition (cold, fever, infection).  Wear comfortable clothing (specific to your surgery type) to the hospital.  Plan for stool softeners for home use; pain medications have a tendency to cause constipation. You can also help prevent constipation by eating foods high in fiber such as fruits and vegetables and drinking plenty of fluids as your diet allows.  After surgery, you can help prevent lung complications by doing breathing exercises.  Take deep breaths and cough every 1-2 hours. Your doctor may order a device called an Incentive Spirometer to help you take deep breaths. When coughing or sneezing, hold a pillow firmly against your incision with both hands. This is called "splinting." Doing this helps protect your incision. It also decreases belly discomfort.  If you are being admitted to the  hospital overnight, leave your suitcase in the car. After surgery it may be brought to your room.  If you are being discharged the day of surgery, you will not be allowed to drive home. You will need a responsible adult (18 years or older) to drive you home and stay with you that night.   If you are taking public transportation, you will need to have a responsible adult (18 years or  older) with you. Please confirm with your physician that it is acceptable to use public transportation.   Please call the [facility]. at [phone] if you have any questions about these instructions.  Visitation Policy:  Patients undergoing a surgery or procedure may have one family member or support person with them as long as that person is not COVID-19 positive or experiencing its symptoms.  That person may remain in the waiting area during the procedure.  Inpatient Visitation:    Visiting hours are 7 a.m. to 8 p.m. Patients will be allowed one visitor. The visitor may change daily. The visitor must pass COVID-19 screenings, use hand sanitizer when entering and exiting the patient's room and wear a mask at all times, including in the patient's room. Patients must also wear a mask when staff or their visitor are in the room. Masking is required regardless of vaccination status. Systemwide, no visitors 17 or younger.

## 2020-11-12 NOTE — Pre-Procedure Instructions (Signed)
Patients reports that she cannot use anti bacterial soap on her skin that it makes her itch and have a rash. She reports that she use's [employer].

## 2020-11-12 NOTE — Progress Notes (Signed)
[facility] Perioperative Services: Pre-Admission/Anesthesia Testing   Date: 11/12/20 Name: Mary Macias MRN:   [id number]  Re: Consideration of preoperative prophylactic antibiotic change   Request sent to: Leim Fabry, MD (routed and/or faxed via [facility])  Planned Surgical Procedure(s):    Case: [id number] Date/Time: 11/22/20 0715   Procedure: Right shoulder arthroscopic rotator cuff repair, subacromial decompression, and biceps tenodesis - Reche Dixon to Assist (Right )   Anesthesia type: Choice   Pre-op diagnosis: Nontraumatic complete tear of right rotator cuff M75.121   Location: [place] 08 / [facility]   Surgeons: Leim Fabry, MD    Notes: 1. Patient has a documented allergy to CEPHALEXIN onlin  . Advising that this medication has caused her to experience a low severity rash in the past.   2. Received PCN/cephalosporin with no documented complications . Amoxicillin-clavulanate received on 07/09/2015, 04/24/2016, 12/25/2017 . PCN G recieved on 11/04/2016  3. Screened as appropriate for cephalosporin use during medication reconciliation . No immediate angioedema, dysphagia, SOB, anaphylaxis symptoms. . No severe rash involving mucous membranes or skin necrosis. . No hospital admissions related to side effects of PCN/cephalosporin use.  . No documented reaction to PCN in the last 10 years.  Request:  As an evidence based approach to reducing the rate of incidence for post-operative SSI and the development of MDROs, could an agent with narrower coverage for preoperative prophylaxis in this patient's upcoming surgical course be considered?   1. Currently ordered preoperative prophylactic ABX: clindamycin.   2. Specifically requesting change to cephalosporin (CEFAZOLIN).   3. Please communicate decision with me and I will change the orders in Epic as per your direction.   Things to consider:  Many patients report that they  were "allergic" to PCN earlier in life, however this does not translate into a true lifelong allergy. Patients can lose sensitivity to specific IgE antibodies over time if PCN is avoided (Kleris & Lugar, 2019).   Up to 10% of the adult population and 15% of hospitalized patients report an allergy to PCN, however clinical studies suggest that 90% of those reporting an allergy can tolerate PCN antibiotics (Kleris & Lugar, 2019).   Cross-sensitivity between PCN and cephalosporins has been documented as being as high as 10%, however this estimation included data believed to have been collected in a setting where there was contamination. Newer data suggests that the prevalence of cross-sensitivity between PCN and cephalosporins is actually estimated to be closer to 1% (Hermanides et al., 2018).    Patients labeled as PCN allergic, whether they are truly allergic or not, have been found to have inferior outcomes in terms of rates of serious infection, and these patients tend to have longer hospital stays (Lafitte, 2019).   Treatment related secondary infections, such as Clostridioides difficile, have been linked to the improper use of broad spectrum antibiotics in patients improperly labeled as PCN allergic (Kleris & Lugar, 2019).   Anaphylaxis from cephalosporins is rare and the evidence suggests that there is no increased risk of an anaphylactic type reaction when cephalosporins are used in a PCN allergic patient (Pichichero, 2006).  Citations: Hermanides J, Lemkes BA, Prins Pearla Dubonnet MW, [place] I. Presumed ?-Lactam Allergy and Cross-reactivity in the Operating Theater: A Practical Approach. Anesthesiology. 2018 Aug;129(2):335-342. doi: 10.1097/ALN.0000000000002252. PMID: 60630160.  Kleris, [place] & Lugar, P. L. (2019). Things We Do For No Reason: Failing to Question a Penicillin Allergy History. Journal of hospital medicine, 14(10), [phone].  Advance online publication.  https://www.wallace-[place].info/  Pichichero, M. E. (2006). Cephalosporins can be prescribed safely for penicillin-allergic patients. Journal of family medicine, 55(2), 106-112. Accessed: https://cdn.mdedge.com/files/s62fs-public/Document/September-2017/5502JFP_AppliedEvidence1.pdf   Honor Loh, MSN, APRN, FNP-C, CEN [facility]  Peri-operative Services Nurse Practitioner FAX: [phone] 11/12/20 3:18 PM

## 2020-11-16 NOTE — Progress Notes (Signed)
  [facility] Perioperative Services: Pre-Admission/Anesthesia Testing     Date: 11/16/20  Name: Mary Macias MRN:   [id number]  Re: Change in [place] for upcoming surgery   Case: [id number] Date/Time: 11/22/20 0715   Procedure: Right shoulder arthroscopic rotator cuff repair, subacromial decompression, and biceps tenodesis - Reche Dixon to Assist (Right )   Anesthesia type: Choice   Pre-op diagnosis: Nontraumatic complete tear of right rotator cuff M75.121   Location: [facility] OR ROOM 08 / [facility]   Surgeons: Leim Fabry, MD    Primary attending surgeon was consulted regarding consideration of therapeutic change in antimicrobial agent being used for preoperative prophylaxis in this patient's upcoming surgical case. Following analysis of the risk versus benefits, Dr. Posey Pronto, Tarry Kos, MD advising that it would be acceptable to discontinue the ordered clindamycin and place an order for cefazolin 2 gm IV on call to the OR. Orders for this patient were amended by me following collaborative conversation with attending surgeon.  Honor Loh, MSN, APRN, FNP-C, CEN [facility]  Peri-operative Services Nurse Practitioner Phone: [phone] 11/16/20 8:13 AM

## 2020-11-18 ENCOUNTER — Other Ambulatory Visit: Payer: Self-pay

## 2020-11-18 ENCOUNTER — Encounter
Admission: RE | Admit: 2020-11-18 | Discharge: 2020-11-18 | Disposition: A | Discharge status: Home / Self Care | Payer: No Typology Code available for payment source | Source: Ambulatory Visit | Attending: Orthopedic Surgery | Admitting: Orthopedic Surgery

## 2020-11-18 DIAGNOSIS — Z20822 Contact with and (suspected) exposure to covid-19: Secondary | ICD-10-CM | POA: Insufficient documentation

## 2020-11-18 DIAGNOSIS — I10 Essential (primary) hypertension: Secondary | ICD-10-CM | POA: Insufficient documentation

## 2020-11-18 DIAGNOSIS — E119 Type 2 diabetes mellitus without complications: Secondary | ICD-10-CM | POA: Diagnosis not present

## 2020-11-18 DIAGNOSIS — Z01818 Encounter for other preprocedural examination: Secondary | ICD-10-CM | POA: Diagnosis present

## 2020-11-18 LAB — SARS CORONAVIRUS 2 (TAT 6-24 HRS): SARS Coronavirus 2: NEGATIVE

## 2020-11-22 ENCOUNTER — Ambulatory Visit: Payer: No Typology Code available for payment source | Admitting: Urgent Care

## 2020-11-22 ENCOUNTER — Encounter: Payer: Self-pay | Admitting: Orthopedic Surgery

## 2020-11-22 ENCOUNTER — Other Ambulatory Visit: Payer: Self-pay

## 2020-11-22 ENCOUNTER — Encounter
Admission: RE | Disposition: A | Discharge status: Home / Self Care | Payer: Self-pay | Source: Home / Self Care | Attending: Orthopedic Surgery

## 2020-11-22 ENCOUNTER — Ambulatory Visit
Admission: RE | Admit: 2020-11-22 | Discharge: 2020-11-22 | Disposition: A | Discharge status: Home / Self Care | Payer: No Typology Code available for payment source | Attending: Orthopedic Surgery | Admitting: Orthopedic Surgery

## 2020-11-22 ENCOUNTER — Ambulatory Visit: Payer: No Typology Code available for payment source

## 2020-11-22 DIAGNOSIS — M25511 Pain in right shoulder: Secondary | ICD-10-CM

## 2020-11-22 DIAGNOSIS — Z79899 Other long term (current) drug therapy: Secondary | ICD-10-CM | POA: Insufficient documentation

## 2020-11-22 DIAGNOSIS — M75121 Complete rotator cuff tear or rupture of right shoulder, not specified as traumatic: Secondary | ICD-10-CM | POA: Diagnosis not present

## 2020-11-22 DIAGNOSIS — Z833 Family history of diabetes mellitus: Secondary | ICD-10-CM | POA: Insufficient documentation

## 2020-11-22 DIAGNOSIS — Z7984 Long term (current) use of oral hypoglycemic drugs: Secondary | ICD-10-CM | POA: Diagnosis not present

## 2020-11-22 DIAGNOSIS — Z808 Family history of malignant neoplasm of other organs or systems: Secondary | ICD-10-CM | POA: Diagnosis not present

## 2020-11-22 DIAGNOSIS — M19011 Primary osteoarthritis, right shoulder: Secondary | ICD-10-CM | POA: Insufficient documentation

## 2020-11-22 DIAGNOSIS — M7521 Bicipital tendinitis, right shoulder: Secondary | ICD-10-CM | POA: Diagnosis not present

## 2020-11-22 DIAGNOSIS — Z86711 Personal history of pulmonary embolism: Secondary | ICD-10-CM | POA: Insufficient documentation

## 2020-11-22 DIAGNOSIS — E119 Type 2 diabetes mellitus without complications: Secondary | ICD-10-CM | POA: Insufficient documentation

## 2020-11-22 DIAGNOSIS — Z6841 Body Mass Index (BMI) 40.0 and over, adult: Secondary | ICD-10-CM | POA: Insufficient documentation

## 2020-11-22 DIAGNOSIS — M25811 Other specified joint disorders, right shoulder: Secondary | ICD-10-CM | POA: Insufficient documentation

## 2020-11-22 DIAGNOSIS — Z803 Family history of malignant neoplasm of breast: Secondary | ICD-10-CM | POA: Insufficient documentation

## 2020-11-22 DIAGNOSIS — Z8249 Family history of ischemic heart disease and other diseases of the circulatory system: Secondary | ICD-10-CM | POA: Diagnosis not present

## 2020-11-22 DIAGNOSIS — Z888 Allergy status to other drugs, medicaments and biological substances status: Secondary | ICD-10-CM | POA: Insufficient documentation

## 2020-11-22 DIAGNOSIS — Z881 Allergy status to other antibiotic agents status: Secondary | ICD-10-CM | POA: Diagnosis not present

## 2020-11-22 DIAGNOSIS — Z886 Allergy status to analgesic agent status: Secondary | ICD-10-CM | POA: Diagnosis not present

## 2020-11-22 DIAGNOSIS — Z823 Family history of stroke: Secondary | ICD-10-CM | POA: Insufficient documentation

## 2020-11-22 DIAGNOSIS — Z86010 Personal history of colonic polyps: Secondary | ICD-10-CM | POA: Insufficient documentation

## 2020-11-22 DIAGNOSIS — Z800 Family history of malignant neoplasm of digestive organs: Secondary | ICD-10-CM | POA: Insufficient documentation

## 2020-11-22 DIAGNOSIS — Z8379 Family history of other diseases of the digestive system: Secondary | ICD-10-CM | POA: Diagnosis not present

## 2020-11-22 HISTORY — PX: SHOULDER ARTHROSCOPY WITH ROTATOR CUFF REPAIR AND SUBACROMIAL DECOMPRESSION: SHX5686

## 2020-11-22 LAB — GLUCOSE, CAPILLARY
Glucose-Capillary: 146 mg/dL — ABNORMAL HIGH (ref 70–99)
Glucose-Capillary: 155 mg/dL — ABNORMAL HIGH (ref 70–99)

## 2020-11-22 SURGERY — SHOULDER ARTHROSCOPY WITH ROTATOR CUFF REPAIR AND SUBACROMIAL DECOMPRESSION
Anesthesia: General | Laterality: Right

## 2020-11-22 MED ORDER — FAMOTIDINE 20 MG PO TABS
ORAL_TABLET | ORAL | Status: AC
  Administered 2020-11-22: 20 mg via ORAL
  Filled 2020-11-22: qty 1

## 2020-11-22 MED ORDER — ROCURONIUM BROMIDE 100 MG/10ML IV SOLN
INTRAVENOUS | Status: DC | PRN
  Administered 2020-11-22: 10 mg via INTRAVENOUS
  Administered 2020-11-22: 20 mg via INTRAVENOUS

## 2020-11-22 MED ORDER — PROPOFOL 10 MG/ML IV BOLUS
INTRAVENOUS | Status: AC
  Filled 2020-11-22: qty 20

## 2020-11-22 MED ORDER — ONDANSETRON 4 MG PO TBDP: 4.0000 mg | ORAL_TABLET | Freq: Three times a day (TID) | ORAL | 0 refills | Status: DC | PRN

## 2020-11-22 MED ORDER — SUCCINYLCHOLINE CHLORIDE 20 MG/ML IJ SOLN
INTRAMUSCULAR | Status: DC | PRN
  Administered 2020-11-22: 120 mg via INTRAVENOUS

## 2020-11-22 MED ORDER — ORAL CARE MOUTH RINSE: 15.0000 mL | Freq: Once | OROMUCOSAL | Status: AC

## 2020-11-22 MED ORDER — LIDOCAINE HCL (CARDIAC) PF 100 MG/5ML IV SOSY
PREFILLED_SYRINGE | INTRAVENOUS | Status: DC | PRN
  Administered 2020-11-22: 100 mg via INTRAVENOUS

## 2020-11-22 MED ORDER — DEXAMETHASONE SODIUM PHOSPHATE 10 MG/ML IJ SOLN
INTRAMUSCULAR | Status: DC | PRN
  Administered 2020-11-22: 5 mg via INTRAVENOUS

## 2020-11-22 MED ORDER — CHLORHEXIDINE GLUCONATE 0.12 % MT SOLN: 15.0000 mL | Freq: Once | OROMUCOSAL | Status: AC

## 2020-11-22 MED ORDER — BUPIVACAINE HCL (PF) 0.5 % IJ SOLN
INTRAMUSCULAR | Status: AC
  Filled 2020-11-22: qty 30

## 2020-11-22 MED ORDER — SEVOFLURANE IN SOLN
RESPIRATORY_TRACT | Status: AC
  Filled 2020-11-22: qty 250

## 2020-11-22 MED ORDER — SODIUM CHLORIDE 0.9 % IV SOLN
INTRAVENOUS | Status: DC | PRN
  Administered 2020-11-22: 20 ug/min via INTRAVENOUS

## 2020-11-22 MED ORDER — EPINEPHRINE PF 1 MG/ML IJ SOLN
INTRAMUSCULAR | Status: AC
  Filled 2020-11-22: qty 4

## 2020-11-22 MED ORDER — LACTATED RINGERS IV SOLN: INTRAVENOUS | Status: DC | PRN

## 2020-11-22 MED ORDER — MIDAZOLAM HCL 2 MG/2ML IJ SOLN: 1.0000 mg | Freq: Once | INTRAMUSCULAR | Status: AC

## 2020-11-22 MED ORDER — BUPIVACAINE HCL (PF) 0.5 % IJ SOLN
INTRAMUSCULAR | Status: DC | PRN
  Administered 2020-11-22: 20 mL

## 2020-11-22 MED ORDER — FENTANYL CITRATE (PF) 100 MCG/2ML IJ SOLN: 50.0000 ug | Freq: Once | INTRAMUSCULAR | Status: DC

## 2020-11-22 MED ORDER — FENTANYL CITRATE (PF) 100 MCG/2ML IJ SOLN: 25.0000 ug | INTRAMUSCULAR | Status: DC | PRN

## 2020-11-22 MED ORDER — CEFAZOLIN SODIUM-DEXTROSE 2-4 GM/100ML-% IV SOLN
2.0000 g | Freq: Once | INTRAVENOUS | Status: AC
  Administered 2020-11-22: 2 g via INTRAVENOUS

## 2020-11-22 MED ORDER — IPRATROPIUM-ALBUTEROL 0.5-2.5 (3) MG/3ML IN SOLN
3.0000 mL | Freq: Once | RESPIRATORY_TRACT | Status: AC
  Administered 2020-11-22: 3 mL via RESPIRATORY_TRACT

## 2020-11-22 MED ORDER — FENTANYL CITRATE (PF) 100 MCG/2ML IJ SOLN
INTRAMUSCULAR | Status: AC
  Filled 2020-11-22: qty 2

## 2020-11-22 MED ORDER — OXYCODONE HCL 5 MG PO TABS: 5.0000 mg | ORAL_TABLET | ORAL | 0 refills | Status: DC | PRN

## 2020-11-22 MED ORDER — CEFAZOLIN SODIUM-DEXTROSE 2-4 GM/100ML-% IV SOLN
INTRAVENOUS | Status: AC
  Filled 2020-11-22: qty 100

## 2020-11-22 MED ORDER — LIDOCAINE HCL (PF) 1 % IJ SOLN
INTRAMUSCULAR | Status: AC
  Filled 2020-11-22: qty 5

## 2020-11-22 MED ORDER — CHLORHEXIDINE GLUCONATE 0.12 % MT SOLN
OROMUCOSAL | Status: AC
  Administered 2020-11-22: 15 mL via OROMUCOSAL
  Filled 2020-11-22: qty 15

## 2020-11-22 MED ORDER — PROPOFOL 10 MG/ML IV BOLUS
INTRAVENOUS | Status: DC | PRN
  Administered 2020-11-22: 150 mg via INTRAVENOUS

## 2020-11-22 MED ORDER — FAMOTIDINE 20 MG PO TABS: 20.0000 mg | ORAL_TABLET | Freq: Once | ORAL | Status: AC

## 2020-11-22 MED ORDER — SUGAMMADEX SODIUM 200 MG/2ML IV SOLN
INTRAVENOUS | Status: DC | PRN
  Administered 2020-11-22: 200 mg via INTRAVENOUS

## 2020-11-22 MED ORDER — ONDANSETRON HCL 4 MG/2ML IJ SOLN: 4.0000 mg | Freq: Once | INTRAMUSCULAR | Status: DC | PRN

## 2020-11-22 MED ORDER — ONDANSETRON HCL 4 MG/2ML IJ SOLN
INTRAMUSCULAR | Status: DC | PRN
  Administered 2020-11-22: 4 mg via INTRAVENOUS

## 2020-11-22 MED ORDER — IPRATROPIUM-ALBUTEROL 0.5-2.5 (3) MG/3ML IN SOLN
RESPIRATORY_TRACT | Status: AC
  Filled 2020-11-22: qty 3

## 2020-11-22 MED ORDER — MIDAZOLAM HCL 2 MG/2ML IJ SOLN
INTRAMUSCULAR | Status: AC
  Filled 2020-11-22: qty 2

## 2020-11-22 MED ORDER — BUPIVACAINE HCL (PF) 0.5 % IJ SOLN
INTRAMUSCULAR | Status: AC
  Filled 2020-11-22: qty 20

## 2020-11-22 MED ORDER — MIDAZOLAM HCL 2 MG/2ML IJ SOLN
INTRAMUSCULAR | Status: DC | PRN
  Administered 2020-11-22: 2 mg via INTRAVENOUS

## 2020-11-22 MED ORDER — ASPIRIN EC 325 MG PO TBEC: 325.0000 mg | DELAYED_RELEASE_TABLET | Freq: Every day | ORAL | 0 refills | Status: AC

## 2020-11-22 MED ORDER — MIDAZOLAM HCL 2 MG/2ML IJ SOLN
INTRAMUSCULAR | Status: AC
  Administered 2020-11-22: 2 mg via INTRAVENOUS
  Filled 2020-11-22: qty 2

## 2020-11-22 MED ORDER — ACETAMINOPHEN 500 MG PO TABS: 1000.0000 mg | ORAL_TABLET | Freq: Three times a day (TID) | ORAL | 2 refills | Status: AC

## 2020-11-22 MED ORDER — FENTANYL CITRATE (PF) 100 MCG/2ML IJ SOLN
INTRAMUSCULAR | Status: DC | PRN
  Administered 2020-11-22: 50 ug via INTRAVENOUS

## 2020-11-22 MED ORDER — SODIUM CHLORIDE 0.9 % IV SOLN: INTRAVENOUS | Status: DC

## 2020-11-22 SURGICAL SUPPLY — 86 items
ADAPTER IRRIG TUBE 2 SPIKE SOL (ADAPTER) ×4 IMPLANT
ADH SKN CLS APL DERMABOND .7 (GAUZE/BANDAGES/DRESSINGS)
ADPR TBG 2 SPK PMP STRL ASCP (ADAPTER) ×2
ANCH SUT 2X2.3 TAPE (Anchor) ×1 IMPLANT
ANCH SUT SHRT 12.5 CANN EYLT (Anchor) ×1 IMPLANT
ANCH SUT SWLK 19.1X4.75 (Anchor) ×1 IMPLANT
ANCHOR 2.3 SP SGL 1.2 XBRAID (Anchor) ×2 IMPLANT
ANCHOR 3.9 PEEK 3 CORKSCREW (Anchor) ×2 IMPLANT
ANCHOR SUT BIO SW 4.75X19.1 (Anchor) ×2 IMPLANT
ANCHOR SUT BIOCOMP LK 2.9X12.5 (Anchor) ×2 IMPLANT
APL PRP STRL LF DISP 70% ISPRP (MISCELLANEOUS) ×1
BLADE AGGRESSIVE PLUS 4.0 (BLADE) ×2 IMPLANT
BNDG ADH 2 X3.75 FABRIC TAN LF (GAUZE/BANDAGES/DRESSINGS) ×2 IMPLANT
BNDG ADH XL 3.75X2 STRCH LF (GAUZE/BANDAGES/DRESSINGS) ×1
BUR BR 5.5 12 FLUTE (BURR) ×2 IMPLANT
BUR RADIUS 4.0X18.5 (BURR) ×2 IMPLANT
CANNULA PART THRD DISP 5.75X7 (CANNULA) ×2 IMPLANT
CANNULA PARTIAL THREAD 2X7 (CANNULA) IMPLANT
CANNULA TWIST IN 8.25X7CM (CANNULA) ×2 IMPLANT
CANNULA TWIST IN 8.25X9CM (CANNULA) IMPLANT
CHLORAPREP W/TINT 26 (MISCELLANEOUS) ×2 IMPLANT
COOLER POLAR GLACIER W/PUMP (MISCELLANEOUS) ×2 IMPLANT
COVER WAND RF STERILE (DRAPES) ×2 IMPLANT
DERMABOND ADVANCED (GAUZE/BANDAGES/DRESSINGS)
DERMABOND ADVANCED .7 DNX12 (GAUZE/BANDAGES/DRESSINGS) IMPLANT
DRAPE 3/4 80X56 (DRAPES) ×2 IMPLANT
DRAPE IMP U-DRAPE 54X76 (DRAPES) ×4 IMPLANT
DRAPE INCISE IOBAN 66X45 STRL (DRAPES) ×2 IMPLANT
DRAPE U-SHAPE 47X51 STRL (DRAPES) ×4 IMPLANT
DRSG TEGADERM 4X4.75 (GAUZE/BANDAGES/DRESSINGS) ×6 IMPLANT
ELECT REM PT RETURN 9FT ADLT (ELECTROSURGICAL) ×2
ELECTRODE REM PT RTRN 9FT ADLT (ELECTROSURGICAL) ×1 IMPLANT
GAUZE SPONGE 4X4 12PLY STRL (GAUZE/BANDAGES/DRESSINGS) ×2 IMPLANT
GAUZE XEROFORM 1X8 LF (GAUZE/BANDAGES/DRESSINGS) ×2 IMPLANT
GLOVE SRG 8 PF TXTR STRL LF DI (GLOVE) ×2 IMPLANT
GLOVE SURG ENC MOIS LTX SZ7.5 (GLOVE) ×2 IMPLANT
GLOVE SURG ORTHO LTX SZ8 (GLOVE) ×2 IMPLANT
GLOVE SURG SYN 8.0 (GLOVE) ×2 IMPLANT
GLOVE SURG UNDER POLY LF SZ8 (GLOVE) ×4
GOWN STRL REUS W/ TWL LRG LVL3 (GOWN DISPOSABLE) ×2 IMPLANT
GOWN STRL REUS W/TWL LRG LVL3 (GOWN DISPOSABLE) ×4
GOWN STRL REUS W/TWL XL LVL4 (GOWN DISPOSABLE) ×2 IMPLANT
IV LACTATED RINGER IRRG 3000ML (IV SOLUTION) ×22
IV LR IRRIG 3000ML ARTHROMATIC (IV SOLUTION) ×11 IMPLANT
KIT CORKSCREW KNTLS 3.9 S/T/P (INSTRUMENTS) IMPLANT
KIT INSERTION 2.9 PUSHLOCK (KITS) ×2 IMPLANT
KIT STABILIZATION SHOULDER (MISCELLANEOUS) ×2 IMPLANT
KIT SUTURETAK 3.0 INSERT PERC (KITS) IMPLANT
KIT TURNOVER KIT A (KITS) ×2 IMPLANT
MANIFOLD NEPTUNE II (INSTRUMENTS) ×2 IMPLANT
MASK FACE SPIDER DISP (MASK) ×2 IMPLANT
MAT ABSORB  FLUID 56X50 GRAY (MISCELLANEOUS) ×4
MAT ABSORB FLUID 56X50 GRAY (MISCELLANEOUS) ×2 IMPLANT
NDL MAYO CATGUT SZ5 (NEEDLE)
NDL SAFETY ECLIPSE 18X1.5 (NEEDLE) ×1 IMPLANT
NDL SUT 5 .5 CRC TPR PNT MAYO (NEEDLE) IMPLANT
NEEDLE HYPO 18GX1.5 SHARP (NEEDLE) ×2
NEEDLE SCORPION MULTI FIRE (NEEDLE) IMPLANT
PACK ARTHROSCOPY SHOULDER (MISCELLANEOUS) ×2 IMPLANT
PAD ARMBOARD 7.5X6 YLW CONV (MISCELLANEOUS) ×2 IMPLANT
PAD WRAPON POLAR SHDR XLG (MISCELLANEOUS) ×1 IMPLANT
PASSER SUT FIRSTPASS SELF (INSTRUMENTS) ×2 IMPLANT
PENCIL SMOKE ULTRAEVAC 22 CON (MISCELLANEOUS) ×2 IMPLANT
SET TUBE SUCT SHAVER OUTFL 24K (TUBING) ×2 IMPLANT
SET TUBE TIP INTRA-ARTICULAR (MISCELLANEOUS) ×2 IMPLANT
SLING ULTRA II M (MISCELLANEOUS) ×2 IMPLANT
STAPLER SKIN PROX 35W (STAPLE) IMPLANT
STRAP SAFETY 5IN WIDE (MISCELLANEOUS) ×2 IMPLANT
SUT ETHILON 3-0 (SUTURE) ×2 IMPLANT
SUT FIBERSNARE 2 CLSD LOOP (SUTURE) ×2 IMPLANT
SUT LASSO 90 DEG CVD (SUTURE) IMPLANT
SUT LASSO 90 DEG SD STR (SUTURE) IMPLANT
SUT MNCRL 4-0 (SUTURE)
SUT MNCRL 4-0 27XMFL (SUTURE)
SUT PROLENE 0 CT 2 (SUTURE) IMPLANT
SUT VIC AB 0 CT1 36 (SUTURE) IMPLANT
SUT VIC AB 2-0 CT2 27 (SUTURE) IMPLANT
SUTURE MNCRL 4-0 27XMF (SUTURE) IMPLANT
SUTURE TAPE FIBERLINK 1.3 LOOP (SUTURE) ×1 IMPLANT
SUTURETAPE FIBERLINK 1.3 LOOP (SUTURE) ×2
TAPE CLOTH 3X10 WHT NS LF (GAUZE/BANDAGES/DRESSINGS) ×2 IMPLANT
TAPE MICROFOAM 4IN (TAPE) ×2 IMPLANT
TUBING ARTHRO INFLOW-ONLY STRL (TUBING) ×2 IMPLANT
TUBING CONNECTING 10 (TUBING) IMPLANT
WAND WEREWOLF FLOW 90D (MISCELLANEOUS) IMPLANT
WRAPON POLAR PAD SHDR XLG (MISCELLANEOUS) ×2

## 2020-11-22 NOTE — Anesthesia Procedure Notes (Signed)
Procedure Name: Intubation Date/Time: 11/22/2020 7:59 AM Performed by: Willette Alma, CRNA Pre-anesthesia Checklist: Patient identified, Patient being monitored, Timeout performed, Emergency Drugs available and Suction available Patient Re-evaluated:Patient Re-evaluated prior to induction Oxygen Delivery Method: Circle system utilized Preoxygenation: Pre-oxygenation with 100% oxygen Induction Type: IV induction Ventilation: Mask ventilation without difficulty Laryngoscope Size: McGraph and 4 Grade View: Grade I Tube type: Oral Tube size: 7.0 mm Number of attempts: 1 Airway Equipment and Method: Stylet Placement Confirmation: ETT inserted through vocal cords under direct vision,  positive ETCO2 and breath sounds checked- equal and bilateral Secured at: 21 cm Tube secured with: Tape Dental Injury: Teeth and Oropharynx as per pre-operative assessment

## 2020-11-22 NOTE — Anesthesia Preprocedure Evaluation (Addendum)
Anesthesia Evaluation  Patient identified by MRN, date of birth, ID band Patient awake    Reviewed: Allergy & Precautions, NPO status , Patient's Chart, lab work & pertinent test results  History of Anesthesia Complications (+) PONV and history of anesthetic complications  Airway Mallampati: III       Dental   Pulmonary sleep apnea and Continuous Positive Airway Pressure Ventilation , neg COPD, Not current smoker,           Cardiovascular (-) hypertension+ angina (-) Past MI and (-) CHF (-) dysrhythmias (-) Valvular Problems/Murmurs     Neuro/Psych neg Seizures PSYCHIATRIC DISORDERS Anxiety Depression negative neurological ROS     GI/Hepatic Neg liver ROS, neg GERD  ,  Endo/Other  diabetes, Type 2, Oral Hypoglycemic AgentsMorbid obesity  Renal/GU negative Renal ROS     Musculoskeletal   Abdominal   Peds  Hematology   Anesthesia Other Findings   Reproductive/Obstetrics                             Anesthesia Physical  Anesthesia Plan  ASA: III  Anesthesia Plan: General   Post-op Pain Management:  Regional for Post-op pain   Induction: Intravenous, Rapid sequence and Cricoid pressure planned  PONV Risk Score and Plan:   Airway Management Planned: Oral ETT  Additional Equipment:   Intra-op Plan:   Post-operative Plan: Extubation in OR  Informed Consent: I have reviewed the patients History and Physical, chart, labs and discussed the procedure including the risks, benefits and alternatives for the proposed anesthesia with the patient or authorized representative who has indicated his/her understanding and acceptance.     Dental advisory given  Plan Discussed with: CRNA and Surgeon  Anesthesia Plan Comments: (Talked with patient about interscalene block for postop pain control.  All risks discussed which include cardiac arrest, seizures, nerve damage, pneumothorax, and block not  working well.  The patient accepts the risks and wishes to proceed with the block.)       Anesthesia Quick Evaluation

## 2020-11-22 NOTE — Progress Notes (Signed)
Pt O2 sats 89-90% on room air. Dr. Kayleen Memos notified. Acknowledged. Orders received.

## 2020-11-22 NOTE — H&P (Signed)
Paper H&P to be scanned into permanent record. H&P reviewed. No significant changes noted.  

## 2020-11-22 NOTE — Op Note (Signed)
SURGERY DATE: 11/22/2020  PRE-OP DIAGNOSIS:  1. Right subacromial impingement 2. Right biceps tendinopathy 3. Right rotator cuff tear (full-thickness supraspinatus) 4. Right acromioclavicular joint osteoarthritis  POST-OP DIAGNOSIS: 1. Right subacromial impingement 2. Right biceps tendinopathy 3. Right rotator cuff tear (full-thickness supraspinatus AND partial thickness subscapularis) 4. Right acromioclavicular joint osteoarthritis  PROCEDURES:  1. Right arthroscopic rotator cuff repair (supraspinatus AND subscapularis) 2. Right arthroscopic biceps tenodesis 3. Right arthroscopic subacromial decompression 4. Right arthroscopic extensive debridement of shoulder (glenohumeral and subacromial spaces) 5. Right arthroscopic distal clavicle excision  SURGEON: Cato Mulligan, MD  ASSISTANT:  Anitra Lauth, PA  ANESTHESIA: Gen with Exparil interscalene block  ESTIMATED BLOOD LOSS: 5cc  DRAINS:  none  TOTAL IV FLUIDS: per anesthesia   SPECIMENS: none  IMPLANTS:  - Iconix SPEED double loaded with 1.2 and 2.57mm tape x 1 - Arthrex 4.75mm SwiveLock anchor x 1 - Arthrex 2.9 mm PushLock anchor x 1 - Arthrex 3.69mm Knotless Corkscrew x1   OPERATIVE FINDINGS:  Examination under anesthesia: A careful examination under anesthesia was performed.  Passive range of motion was: FF: 160; ER at side: 45; ER in abduction: 90; IR in abduction: 60.  Anterior load shift: NT.  Posterior load shift: NT.  Sulcus in neutral: NT.  Sulcus in ER: NT.    Intra-operative findings: A thorough arthroscopic examination of the shoulder was performed.  The findings are: 1. Biceps tendon: tendinopathy with erythema, fraying of the proximal biceps into the anchor, and small longitudinal split tearing of the intra-articular portion 2. Superior labrum: Degenerative 3. Posterior labrum and capsule: normal 4. Inferior capsule and inferior recess: normal 5. Glenoid cartilage surface: Normal 6. Supraspinatus  attachment: full-thickness tear of anterior supraspinatus 7. Posterior rotator cuff attachment: normal 8. Humeral head articular cartilage: normal 9. Rotator interval: significant synovitis 10: Subscapularis tendon: Partial-thickness articular sided tear of the superior border 11. Anterior labrum: Mildly degenerative 12. IGHL: normal  OPERATIVE REPORT:   Indications for procedure: Mary Macias is a 55 y.o. female with approximately 4 months of shoulder pain that has failed nonoperative management consisting of corticosteroid injection, physical therapy, medical management, and activity modifications. Clinical exam and MRI were suggestive of full-thickness rotator cuff tear, biceps tendinopathy, subacromial impingement, and acromioclavicular joint arthritis. After discussion of risks, benefits, and alternatives to surgery, the patient elected to proceed.   Procedure in detail:  I identified Mary Macias in the pre-operative holding area.  I marked the operative shoulder with my initials. I reviewed the risks and benefits of the proposed surgical intervention, and the patient (and/or patient's guardian) wished to proceed.  Anesthesia was then performed with an interscalene block.  Exparel was not used due to concern for possible postoperative ventilation given the patient's history of OSA.  The patient was transferred to the operative suite and placed in the beach chair position.    Appropriate IV antibiotics were administered prior to incision. The operative upper extremity was then prepped and draped in standard fashion. A time out was performed confirming the correct extremity, correct patient, and correct procedure.   I then created a standard posterior portal with an 11 blade. The glenohumeral joint was easily entered with a blunt trocar and the arthroscope introduced. The findings of diagnostic arthroscopy are described above. I debrided degenerative tissue including the synovitic  tissue about the rotator interval and anterior and superior labrum. I then coagulated the inflamed synovium to obtain hemostasis and reduce the risk of post-operative swelling using an Arthrocare  radiofrequency device. I  The subscapularis tear was identified with a posterior lever push.  Tissue about the subscapularis was released anteriorly, superiorly, and posteriorly. The lesser tuberosity footprint was prepared with a combination of electrocautery and burr.  An Arthrex knotless corkscrew was placed into the lesser tuberosity footprint from the anterior portal.  A BirdBeak was used to pass the passing suture through the torn portion of the subscapularis.  The suture was shuttled through the anchor.  The arm was placed in neutral rotation and the repair was tensioned appropriately. This appropriately reduced the subscapularis tear.  The arm was then internally and externally rotated and the subscapularis was noted to move appropriately with rotation.  The remainder of the suture was then cut.  I then turned my attention to the arthroscopic biceps tenodesis.  I used the loop n tack technique to pass a fiber tape through the biceps in a locked fashion adjacent to the biceps anchor.  A hole for a 2.9 mm Arthrex PushLock was drilled in the bicipital groove just superior to the subscapularis tendon insertion.  The biceps tendon was then cut.  The fiber tape was loaded onto the push lock anchor and impacted into place into the previously drilled hole in the bicipital groove.  This appropriately secured the biceps into the bicipital groove and took it off of tension.  Next, the arthroscope was then introduced into the subacromial space. A direct lateral portal was created with an 11-blade after spinal needle localization. An extensive subacromial bursectomy was performed using a combination of the shaver and Arthrocare wand. The entire acromial undersurface was exposed and the CA ligament was subperiosteally  elevated to expose the anterior acromial hook. A burr was used to create a flat anterior and lateral aspect of the acromion, converting it from a Type 2 to a Type 1 acromion. Care was made to keep the deltoid fascia intact.  I then turned my attention to the arthroscopic distal clavicle excision. I identified the acromioclavicular joint. Surrounding bursal tissue was debrided and the edges of the joint were identified. I used the 5.73mm barrel burr to remove the distal clavicle parallel to the edge of the acromion. I was able to fit two widths of the burr into the space between the distal clavicle and acromion, signifying that I had removed ~41mm of distal clavicle. This was confirmed by viewing anteriorly and introducing a probe with measuring marks from the lateral portal. Hemostasis was achieved with an Arthrocare wand. Fluid was evacuated from the shoulder.   Next I created an accessory posterolateral portal to assist with visualization and instrumentation.  I debrided the poor quality edges of the supraspinatus tendon.  This was a U-shaped tear of the anterior supraspinatus.  I cleared the footprint of soft tissue using an ArthroCare wand and prepared the footprint using a burr to expose bleeding bone.   I then percutaneously placed 1 Iconix SPEED medial row anchor. This was placed at the central portion of the tear at the articular margin. I then shuttled all 4 strands of tape through the rotator cuff just lateral to the musculotendinous junction using a suture passer.  All 4 strands of the suture were passed through the SwiveLock anchor for the lateral row fixation.  Sutures were tensioned appropriately and anchor was placed.  There was a small anterior dog ear so the 2 FiberWire sutures were passed through the dog ear and a knot was tied arthroscopically.  This construct achieved excellent compression and reduction of  the rotator cuff tear over its footprint without undue tension.  This construct was  stable to external and internal rotation.  Fluid was evacuated from the shoulder, and the portals were closed with 3-0 Nylon. Xeroform was applied to the portals. A sterile dressing was applied, followed by a Polar Care sleeve and a SlingShot shoulder immobilizer/sling. The patient was awakened from anesthesia without difficulty and was transferred to the PACU in stable condition.   Of note, assistance from a PA was essential to performing the surgery.  PA was present for the entire surgery.  PA assisted with patient positioning, instrumentation, and wound closure. The surgery would have been more difficult and had longer operative time without PA assistance.   Additionally, this case had increased complexity compared to standard arthroscopic rotator cuff repair given the involvement of the subscapularis tear. Repair of this tear increased surgical time by 20 minutes and increased complexity due to additional preparation and repair of subscapularis tear and use of additional implants, which would have otherwise not occurred.   COMPLICATIONS: none  DISPOSITION: plan for discharge home after recovery in PACU   POSTOPERATIVE PLAN: Remain in sling (except hygiene and elbow/wrist/hand RoM exercises as instructed by PT) x 6 weeks and NWB for this time. PT to begin 3-4 days after surgery.  Large rotator cuff repair rehab protocol. ASA 325mg  daily x 2 weeks for DVT ppx.

## 2020-11-22 NOTE — Anesthesia Procedure Notes (Signed)
Anesthesia Regional Block: Interscalene brachial plexus block   Pre-Anesthetic Checklist: ,, timeout performed, Correct Patient, Correct Site, Correct Laterality, Correct Procedure, Correct Position, site marked, Risks and benefits discussed,  Surgical consent,  Pre-op evaluation,  At surgeon's request and post-op pain management  Laterality: Right  Prep: chloraprep, alcohol swabs       Needles:  Injection technique: Single-shot  Needle Type: Stimiplex     Needle Length: 5cm  Needle Gauge: 22     Additional Needles:   Procedures:, nerve stimulator,,, ultrasound used (permanent image in chart),,,,   Nerve Stimulator or Paresthesia:  Response: biceps flexion, 0.5 mA,   Additional Responses:   Narrative:  Start time: 11/22/2020 7:40 AM End time: 11/22/2020 7:46 AM Injection made incrementally with aspirations every 5 mL.  Performed by: Personally  Anesthesiologist: Alvin Critchley, MD  Additional Notes: Functioning IV was confirmed and monitors were applied.  A 40mm 22ga Stimuplex needle was used. Sterile prep and drape,hand hygiene and sterile gloves were used.  Negative aspiration and negative test dose prior to incremental administration of local anesthetic. The patient tolerated the procedure well.  Easy injection and no pain on injection.

## 2020-11-22 NOTE — Progress Notes (Signed)
Actual time [phone]

## 2020-11-22 NOTE — Discharge Instructions (Signed)
Post-Op Instructions - Rotator Cuff Repair  1. Bracing: You will wear a shoulder immobilizer or sling for 6 weeks.   2. Driving: No driving for 3 weeks post-op. When driving, do not wear the immobilizer. Ideally, we recommend no driving for 6 weeks while sling is in place as one arm will be immobilized.   3. Activity: No active lifting for 2 months. Wrist, hand, and elbow motion only. Avoid lifting the upper arm away from the body except for hygiene. You are permitted to bend and straighten the elbow passively only (no active elbow motion). You may use your hand and wrist for typing, writing, and managing utensils (cutting food). Do not lift more than a coffee cup for 8 weeks.  When sleeping or resting, inclined positions (recliner chair or wedge pillow) and a pillow under the forearm for support may provide better comfort for up to 4 weeks.  Avoid long distance travel for 4 weeks.  Return to normal activities after rotator cuff repair repair normally takes 6 months on average. If rehab goes very well, may be able to do most activities at 4 months, except overhead or contact sports.  4. Physical Therapy: Begins 3-4 days after surgery, and proceed 1 time per week for the first 6 weeks, then 1-2 times per week from weeks 6-20 post-op.  5. Medications:  - You will be provided a prescription for narcotic pain medicine. After surgery, take 1-2 narcotic tablets every 4 hours if needed for severe pain.  - A prescription for anti-nausea medication will be provided in case the narcotic medicine causes nausea - take 1 tablet every 6 hours only if nauseated.   - Take tylenol 1000 mg (2 Extra Strength tablets or 3 regular strength) every 8 hours for pain.  May decrease or stop tylenol 5 days after surgery if you are having minimal pain. - Take ASA 325mg/day x 2 weeks to help prevent DVTs/PEs (blood clots).  - DO NOT take ANY nonsteroidal anti-inflammatory pain medications (Advil, Motrin, Ibuprofen, Aleve,  Naproxen, or Naprosyn). These medicines can inhibit healing of your shoulder repair.    If you are taking prescription medication for anxiety, depression, insomnia, muscle spasm, chronic pain, or for attention deficit disorder, you are advised that you are at a higher risk of adverse effects with use of narcotics post-op, including narcotic addiction/dependence, depressed breathing, death. If you use non-prescribed substances: alcohol, marijuana, cocaine, heroin, methamphetamines, etc., you are at a higher risk of adverse effects with use of narcotics post-op, including narcotic addiction/dependence, depressed breathing, death. You are advised that taking > 50 morphine milligram equivalents (MME) of narcotic pain medication per day results in twice the risk of overdose or death. For your prescription provided: oxycodone 5 mg - taking more than 6 tablets per day would result in > 50 morphine milligram equivalents (MME) of narcotic pain medication. Be advised that we will prescribe narcotics short-term, for acute post-operative pain only - 3 weeks for major operations such as shoulder repair/reconstruction surgeries.     6. Post-Op Appointment:  Your first post-op appointment will be 10-14 days post-op.  7. Work or School: For most, but not all procedures, we advise staying out of work or school for at least 1 to 2 weeks in order to recover from the stress of surgery and to allow time for healing.   If you need a work or school note this can be provided.   8. Smoking: If you are a smoker, you need to refrain from   smoking in the postoperative period. The nicotine in cigarettes will inhibit healing of your shoulder repair and decrease the chance of successful repair. Similarly, nicotine containing products (gum, patches) should be avoided.   Post-operative Brace: Apply and remove the brace you received as you were instructed to at the time of fitting and as described in detail as the braces  instructions for use indicate.  Wear the brace for the period of time prescribed by your physician.  The brace can be cleaned with soap and water and allowed to air dry only.  Should the brace result in increased pain, decreased feeling (numbness/tingling), increased swelling or an overall worsening of your medical condition, please contact your doctor immediately.  If an emergency situation occurs as a result of wearing the brace after normal business hours, please dial 911 and seek immediate medical attention.  Let your doctor know if you have any further questions about the brace issued to you. Refer to the shoulder sling instructions for use if you have any questions regarding the correct fit of your shoulder sling.  [place] [facility]: [phone]  Video that illustrates how to properly use a shoulder sling: "Instructions for Proper Use of an Orthopaedic Sling" [email or web address]   AMBULATORY SURGERY  DISCHARGE INSTRUCTIONS   1) The drugs that you were given will stay in your system until tomorrow so for the next 24 hours you should not:  A) Drive an automobile B) Make any legal decisions C) Drink any alcoholic beverage   2) You may resume regular meals tomorrow.  Today it is better to start with liquids and gradually work up to solid foods.  You may eat anything you prefer, but it is better to start with liquids, then soup and crackers, and gradually work up to solid foods.   3) Please notify your doctor immediately if you have any unusual bleeding, trouble breathing, redness and pain at the surgery site, drainage, fever, or pain not relieved by medication.    4) Additional Instructions:        Please contact your physician with any problems or Same Day Surgery at [phone], Monday through Friday 6 am to 4 pm, or [place] [facility] number at [phone].

## 2020-11-22 NOTE — Transfer of Care (Signed)
Immediate Anesthesia Transfer of Care Note  Patient: Mary Macias  Procedure(s) Performed: Right shoulder arthroscopic rotator cuff repair, subacromial decompression, and biceps tenodesis ,distal clavicle excision (Right )  Patient Location: PACU  Anesthesia Type:General  Level of Consciousness: awake, alert  and oriented  Airway & Oxygen Therapy: Patient Spontanous Breathing and Patient connected to face mask oxygen  Post-op Assessment: Report given to RN and Post -op Vital signs reviewed and stable  Post vital signs: Reviewed and stable  Last Vitals:  Vitals Value Taken Time  BP 139/75 11/22/20 1048  Temp 36.4 C 11/22/20 1048  Pulse 80 11/22/20 1053  Resp    SpO2 96 % 11/22/20 1053  Vitals shown include unvalidated device data.  Last Pain:  Vitals:   11/22/20 1048  TempSrc:   PainSc: Asleep         Complications: No complications documented.

## 2020-11-23 ENCOUNTER — Encounter: Payer: Self-pay | Admitting: Orthopedic Surgery

## 2020-11-29 NOTE — Anesthesia Postprocedure Evaluation (Signed)
Anesthesia Post Note  Patient: Mary Macias  Procedure(s) Performed: Right shoulder arthroscopic rotator cuff repair, subacromial decompression, and biceps tenodesis ,distal clavicle excision (Right )  Patient location during evaluation: PACU Anesthesia Type: General Level of consciousness: awake and alert and oriented Pain management: pain level controlled Vital Signs Assessment: post-procedure vital signs reviewed and stable Respiratory status: spontaneous breathing Cardiovascular status: blood pressure returned to baseline Postop Assessment: no headache Anesthetic complications: no   No complications documented.   Last Vitals:  Vitals:   11/22/20 1213 11/22/20 1234  BP: (!) 154/83 (!) 147/79  Pulse: 94 90  Resp: 16 16  Temp: (!) 36.3 C 36.5 C  SpO2: 94% 93%    Last Pain:  Vitals:   11/23/20 0810  TempSrc:   PainSc: 4                  Gerritt Galentine

## 2020-12-03 ENCOUNTER — Telehealth: Payer: Self-pay | Admitting: Internal Medicine

## 2020-12-03 ENCOUNTER — Other Ambulatory Visit: Payer: Self-pay

## 2020-12-03 DIAGNOSIS — B379 Candidiasis, unspecified: Secondary | ICD-10-CM

## 2020-12-03 DIAGNOSIS — T3695XA Adverse effect of unspecified systemic antibiotic, initial encounter: Secondary | ICD-10-CM

## 2020-12-03 MED ORDER — FLUCONAZOLE 150 MG PO TABS: 150.0000 mg | ORAL_TABLET | Freq: Once | ORAL | 0 refills | Status: AC

## 2020-12-03 NOTE — Progress Notes (Signed)
dif

## 2020-12-03 NOTE — Telephone Encounter (Signed)
Spoke to pt sent in diflucan. Let pt know if she is having any other symptoms we would have to see her for a office visit. Pt verbalized understanding.  KP

## 2020-12-03 NOTE — Telephone Encounter (Signed)
Pt is calling to request a script for diflucan. Pt states that she is taking FARXIGA 10 MG TABS tablet [[id number]] That can cause yeast infection. And pt had shoulder surgery and is also on antibiotics for this. Pt states that she has a cpe on 12/15/20. Please [phone] Preferred Pharmacy-[facility] [place].

## 2020-12-15 ENCOUNTER — Ambulatory Visit (INDEPENDENT_AMBULATORY_CARE_PROVIDER_SITE_OTHER): Payer: 59 | Admitting: Internal Medicine

## 2020-12-15 ENCOUNTER — Encounter: Payer: Self-pay | Admitting: Internal Medicine

## 2020-12-15 ENCOUNTER — Other Ambulatory Visit: Payer: Self-pay

## 2020-12-15 VITALS — BP 126/78 | HR 62 | Temp 97.5°F | Ht 66.0 in | Wt 307.0 lb

## 2020-12-15 DIAGNOSIS — Z6841 Body Mass Index (BMI) 40.0 and over, adult: Secondary | ICD-10-CM | POA: Diagnosis not present

## 2020-12-15 DIAGNOSIS — E118 Type 2 diabetes mellitus with unspecified complications: Secondary | ICD-10-CM | POA: Diagnosis not present

## 2020-12-15 DIAGNOSIS — Z1231 Encounter for screening mammogram for malignant neoplasm of breast: Secondary | ICD-10-CM

## 2020-12-15 DIAGNOSIS — G4733 Obstructive sleep apnea (adult) (pediatric): Secondary | ICD-10-CM

## 2020-12-15 DIAGNOSIS — E785 Hyperlipidemia, unspecified: Secondary | ICD-10-CM

## 2020-12-15 DIAGNOSIS — Z0000 Encounter for general adult medical examination without abnormal findings: Secondary | ICD-10-CM | POA: Diagnosis not present

## 2020-12-15 DIAGNOSIS — M75121 Complete rotator cuff tear or rupture of right shoulder, not specified as traumatic: Secondary | ICD-10-CM | POA: Diagnosis not present

## 2020-12-15 DIAGNOSIS — Z90710 Acquired absence of both cervix and uterus: Secondary | ICD-10-CM | POA: Insufficient documentation

## 2020-12-15 DIAGNOSIS — E1169 Type 2 diabetes mellitus with other specified complication: Secondary | ICD-10-CM

## 2020-12-15 LAB — POCT UA - MICROALBUMIN: Microalbumin Ur, POC: NEGATIVE mg/L

## 2020-12-15 MED ORDER — FLUCONAZOLE 100 MG PO TABS: 100.0000 mg | ORAL_TABLET | Freq: Every day | ORAL | 0 refills | Status: DC

## 2020-12-15 NOTE — Progress Notes (Signed)
Date:  12/15/2020   Name:  Mary Macias   DOB:  24-Jan-1966   MRN:  [id number]   Chief Complaint: Annual Exam (No Breast Exam- patient wearing a sling today. No pap. POCT MICRO. Foot Exam.)  Mary Macias is a 56 y.o. female who presents today for her Complete Annual Exam. She feels well. She reports exercising - not much. She reports she is sleeping poorly. Breast complaints - none.  Mammogram: 11/2019 [facility] DEXA: none Pap smear: discontinued Colonoscopy: 07/2017 repeat 5 yrs  Immunization History  Administered Date(s) Administered  . Influenza,inj,Quad PF,6+ Mos 10/12/2015, 07/14/2016, 06/14/2018, 08/07/2019, 06/03/2020  . Influenza-Unspecified 08/08/2017  . PFIZER(Purple Top)SARS-COV-2 Vaccination 12/12/2019, 12/29/2019, 08/15/2020  . Pneumococcal Polysaccharide-23 01/25/2018  . Tdap 01/07/2015  . Zoster Recombinat (Shingrix) 11/17/2019, 01/19/2020    Diabetes She presents for her follow-up diabetic visit. She has type 2 diabetes mellitus. Her disease course has been stable. Pertinent negatives for hypoglycemia include no dizziness, headaches, nervousness/anxiousness or tremors. Pertinent negatives for diabetes include no chest pain, no fatigue, no polydipsia and no polyuria. Current diabetic treatment includes oral agent (dual therapy) Celesta Gentile and farxiga). She is compliant with treatment most of the time. She monitors blood glucose at home 5+ x per day. Her home blood glucose trend is decreasing steadily (average 138 over past 5 weeks). An ACE inhibitor/angiotensin II receptor blocker is not being taken. Eye exam is not current.  Hyperlipidemia This is a chronic problem. The problem is resistant. Recent lipid tests were reviewed and are high. Pertinent negatives include no chest pain or shortness of breath. Current antihyperlipidemic treatment includes statins (and vascepa ordered for high trigs but not taking). The current treatment provides moderate improvement of  lipids.  Depression        This is a chronic problem.The problem is unchanged.  Associated symptoms include no fatigue and no headaches.  Past treatments include SSRIs - Selective serotonin reuptake inhibitors.  Compliance with treatment is good. OSA - on CPAP nightly.    Lab Results  Component Value Date   CREATININE 0.84 09/29/2020   BUN 14 09/29/2020   NA 138 09/29/2020   K 4.0 09/29/2020   CL 96 09/29/2020   CO2 24 09/29/2020   Lab Results  Component Value Date   CHOL 209 (H) 06/03/2020   HDL 51 06/03/2020   LDLCALC 98 06/03/2020   TRIG 298 (H) 06/03/2020   CHOLHDL 4.1 06/03/2020   Lab Results  Component Value Date   TSH 2.340 11/21/2019   Lab Results  Component Value Date   HGBA1C 8.2 (H) 09/29/2020   Lab Results  Component Value Date   WBC 7.7 11/21/2019   HGB 14.6 11/21/2019   HCT 44.8 11/21/2019   MCV 94 11/21/2019   PLT 323 11/21/2019   Lab Results  Component Value Date   ALT 23 09/29/2020   AST 22 09/29/2020   ALKPHOS 85 09/29/2020   BILITOT 0.3 09/29/2020     Review of Systems  Constitutional: Negative for chills, fatigue and fever.  HENT: Negative for congestion, hearing loss, tinnitus, trouble swallowing and voice change.   Eyes: Negative for visual disturbance.  Respiratory: Negative for cough, chest tightness, shortness of breath and wheezing.   Cardiovascular: Negative for chest pain, palpitations and leg swelling.  Gastrointestinal: Negative for abdominal pain, constipation, diarrhea and vomiting.  Endocrine: Negative for polydipsia and polyuria.  Genitourinary: Negative for dysuria, frequency, genital sores, vaginal bleeding and vaginal discharge.  Musculoskeletal: Positive for arthralgias (s/p  shoulder surgery three weeks ago - right arm in sling). Negative for gait problem and joint swelling.  Skin: Negative for color change and rash.  Neurological: Negative for dizziness, tremors, light-headedness and headaches.  Hematological: Negative  for adenopathy. Does not bruise/bleed easily.  Psychiatric/Behavioral: Positive for depression. Negative for dysphoric mood and sleep disturbance. The patient is not nervous/anxious.     Patient Active Problem List   Diagnosis Date Noted  . Hx of total hysterectomy 12/15/2020  . Nontraumatic complete tear of right rotator cuff 12/15/2020  . OSA (obstructive sleep apnea) 02/02/2020  . Elevated liver function tests 11/22/2019  . Depression with anxiety 08/07/2019  . Peripheral edema 02/08/2018  . BMI 50.0-59.9, adult ([place]) 01/25/2018  . Colon polyp, hyperplastic 07/16/2017  . Type II diabetes mellitus with complication ([place]) 17/00/1749  . Hyperlipidemia associated with type 2 diabetes mellitus ([place]) 07/08/2015  . Environmental and seasonal allergies 07/08/2015  . Avitaminosis D 07/08/2015  . Beat, premature ventricular 02/11/2015    Allergies  Allergen Reactions  . Peanut-Containing Drug Products Hives and Swelling    Uses EPI-PEN As Needed for this.  . Anti-Bacterial Hand [Soap & Cleansers] Itching and Rash  . Atorvastatin Other (See Comments)    Severe body aches  . Meloxicam Other (See Comments)    Kidney function reduced   . Povidone Iodine Rash  . Cephalexin Rash  . Metformin And Related Diarrhea  . Trulicity [Dulaglutide]     Abdominal discomfort, flatus    Past Surgical History:  Procedure Laterality Date  . CHOLECYSTECTOMY    . COLONOSCOPY WITH PROPOFOL N/A 07/13/2017   Procedure: COLONOSCOPY WITH PROPOFOL;  Surgeon: Lollie Sails, MD;  Location: [facility] ENDOSCOPY;  Service: Endoscopy;  Laterality: N/A;  . NASAL SINUS SURGERY    . OOPHORECTOMY    . SHOULDER ARTHROSCOPY WITH ROTATOR CUFF REPAIR AND SUBACROMIAL DECOMPRESSION Right 11/22/2020   Procedure: Right shoulder arthroscopic rotator cuff repair, subacromial decompression, and biceps tenodesis ,distal clavicle excision;  Surgeon: Leim Fabry, MD;  Location: [facility] ORS;  Service: Orthopedics;  Laterality: Right;   . TOTAL VAGINAL HYSTERECTOMY    . TUBAL LIGATION      Social History   Tobacco Use  . Smoking status: Never Smoker  . Smokeless tobacco: Never Used  Vaping Use  . Vaping Use: Never used  Substance Use Topics  . Alcohol use: No    Alcohol/week: 0.0 standard drinks  . Drug use: No     Medication list has been reviewed and updated.  Current Meds  Medication Sig  . acetaminophen (TYLENOL) 500 MG tablet Take 2 tablets (1,000 mg total) by mouth every 8 (eight) hours.  Marland Kitchen acyclovir ointment (ZOVIRAX) 5 % Apply 1 application topically every 3 (three) hours. (Patient taking differently: Apply 1 application topically daily as needed (cold sores).)  . blood glucose meter kit and supplies Dispense based on patient and insurance preference. Use up to four times daily as directed. (FOR ICD-10 E10.9, E11.9).  Marland Kitchen Cholecalciferol (DIALYVITE VITAMIN D 5000) 125 MCG (5000 UT) capsule Take 5,000 Units by mouth 3 (three) times a week.  . Continuous Blood Gluc Receiver (FREESTYLE LIBRE 14 DAY READER) DEVI 1 each by Does not apply route 5 (five) times daily.  . Continuous Blood Gluc Sensor (FREESTYLE LIBRE 14 DAY SENSOR) MISC Apply 1 each topically every 14 (fourteen) days. Use to test BS 5 times+ per day  . EPINEPHrine 0.3 mg/0.3 mL IJ SOAJ injection Inject 0.3 mLs (0.3 mg total) into the muscle  as needed for anaphylaxis.  Marland Kitchen escitalopram (LEXAPRO) 10 MG tablet Take 1 tablet (10 mg total) by mouth daily.  . famotidine-calcium carbonate-magnesium hydroxide (PEPCID COMPLETE) 10-800-165 MG chewable tablet Chew 1 tablet by mouth daily as needed (indigestion).  Marland Kitchen FARXIGA 10 MG TABS tablet TAKE 1 TABLET BY MOUTH DAILY BEFORE BREAKFAST. (Patient taking differently: Take 10 mg by mouth daily.)  . nystatin cream (MYCOSTATIN) Apply 1 application topically 2 (two) times daily. (Patient taking differently: Apply 1 application topically as needed (yeast).)  . ONETOUCH VERIO test strip USE UP TO 4 TIMES DAILY AS  DIRECTED  . pravastatin (PRAVACHOL) 40 MG tablet Take 40 mg by mouth daily.  . sitaGLIPtin (JANUVIA) 100 MG tablet Take 1 tablet (100 mg total) by mouth daily.  . valACYclovir (VALTREX) 1000 MG tablet Take 1,000 mg by mouth as needed (cold sores).    PHQ 2/9 Scores 12/15/2020 09/29/2020 06/03/2020 11/17/2019  PHQ - 2 Score 4 0 2 0  PHQ- 9 Score _0 0    GAD 7 : Generalized Anxiety Score 12/15/2020 09/29/2020 06/03/2020 10/22/2019  Nervous, Anxious, on Edge _1 Control/stop worrying _2 Worry too much - different things _3 Trouble relaxing 0 0 1 2  Restless 0 0 0 0  Easily annoyed or irritable 0 0 3 1  Afraid - awful might happen 0 0 0 0  Total GAD 7 Score _4 Anxiety Difficulty Not difficult at all - Not difficult at all Somewhat difficult    BP Readings from Last 3 Encounters:  12/15/20 126/78  11/22/20 (!) 147/79  09/29/20 122/82    Physical Exam Vitals and nursing note reviewed.  Constitutional:      General: She is not in acute distress.    Appearance: She is well-developed.  HENT:     Head: Normocephalic and atraumatic.     Right Ear: Tympanic membrane and ear canal normal.     Left Ear: Tympanic membrane and ear canal normal.     Nose:     Right Sinus: No maxillary sinus tenderness.     Left Sinus: No maxillary sinus tenderness.  Eyes:     General: No scleral icterus.       Right eye: No discharge.        Left eye: No discharge.     Conjunctiva/sclera: Conjunctivae normal.  Neck:     Thyroid: No thyromegaly.     Vascular: No carotid bruit.  Cardiovascular:     Rate and Rhythm: Normal rate and regular rhythm.     Pulses: Normal pulses.     Heart sounds: Normal heart sounds.  Pulmonary:     Effort: Pulmonary effort is normal. No respiratory distress.     Breath sounds: No wheezing.  Chest:     Comments: Breast deferred due to shoulder surgery/sling Musculoskeletal:     Cervical back: Normal range of motion. No erythema.     Right lower  leg: No edema.     Left lower leg: No edema.  Lymphadenopathy:     Cervical: No cervical adenopathy.  Skin:    General: Skin is warm and dry.     Findings: No rash.  Neurological:     Mental Status: She is alert and oriented to person, place, and time.     Cranial Nerves: No cranial nerve deficit.     Sensory: No sensory deficit.     Deep Tendon  Reflexes: Reflexes are normal and symmetric.  Psychiatric:        Attention and Perception: Attention normal.        Mood and Affect: Mood normal.     Wt Readings from Last 3 Encounters:  12/15/20 (!) 307 lb (139.3 kg)  11/22/20 (!) 312 lb (141.5 kg)  09/29/20 (!) 310 lb (140.6 kg)    BP 126/78   Pulse 62   Temp (!) 97.5 F (36.4 C) (Oral)   Ht _0  (1.676 m)   Wt (!) 307 lb (139.3 kg)   SpO2 95%   BMI 49.55 kg/m   Assessment and Plan: 1. Annual physical exam Exam is normal except for weight. Encourage regular exercise and appropriate dietary changes. - CBC with Differential/Platelet  2. Encounter for screening mammogram for breast cancer Schedule in the near future once shoulder has recovered - MM 3D SCREEN BREAST BILATERAL; Future  3. Type II diabetes mellitus with complication (HCC) Clinically stable by exam and report without s/s of hypoglycemia. DM complicated by lipids. Tolerating medications - well without side effects or other concerns. She does have intermittent vaginal itching/yeast due to farxiga - Rx for diflucan given Eye exam is scheduled for the near future - POCT UA - Microalbumin - Comprehensive metabolic panel - Hemoglobin A1c - TSH  4. Hyperlipidemia associated with type 2 diabetes mellitus ([place]) Tolerating statin medication without side effects at this time LDL is at goal of < 70 on current dose Continue same therapy without change at this time. - Lipid panel  5. OSA (obstructive sleep apnea) On CPAP nightly for years - excellent compliance and restful sleep  6. Nontraumatic complete tear of  right rotator cuff S/p repair  7. BMI 50.0-59.9, adult ([place]) Continue to work on diet changes and weight loss   Partially dictated using Editor, commissioning. Any errors are unintentional.  Halina Maidens, MD [facility]  12/15/2020

## 2020-12-16 ENCOUNTER — Other Ambulatory Visit: Payer: Self-pay

## 2020-12-16 DIAGNOSIS — E1169 Type 2 diabetes mellitus with other specified complication: Secondary | ICD-10-CM

## 2020-12-16 DIAGNOSIS — E785 Hyperlipidemia, unspecified: Secondary | ICD-10-CM

## 2020-12-16 LAB — COMPREHENSIVE METABOLIC PANEL
ALT: 21 IU/L (ref 0–32)
AST: 18 IU/L (ref 0–40)
Albumin/Globulin Ratio: 1.5 (ref 1.2–2.2)
Albumin: 4.4 g/dL (ref 3.8–4.9)
Alkaline Phosphatase: 70 IU/L (ref 44–121)
BUN/Creatinine Ratio: 12 (ref 9–23)
BUN: 12 mg/dL (ref 6–24)
Bilirubin Total: 0.6 mg/dL (ref 0.0–1.2)
CO2: 23 mmol/L (ref 20–29)
Calcium: 9.4 mg/dL (ref 8.7–10.2)
Chloride: 96 mmol/L (ref 96–106)
Creatinine, Ser: 1.04 mg/dL — ABNORMAL HIGH (ref 0.57–1.00)
Globulin, Total: 2.9 g/dL (ref 1.5–4.5)
Glucose: 133 mg/dL — ABNORMAL HIGH (ref 65–99)
Potassium: 4.3 mmol/L (ref 3.5–5.2)
Sodium: 137 mmol/L (ref 134–144)
Total Protein: 7.3 g/dL (ref 6.0–8.5)
eGFR: 63 mL/min/{1.73_m2} (ref >59)

## 2020-12-16 LAB — LIPID PANEL
Chol/HDL Ratio: 4.6 ratio — ABNORMAL HIGH (ref 0.0–4.4)
Cholesterol, Total: 206 mg/dL — ABNORMAL HIGH (ref 100–199)
HDL: 45 mg/dL (ref >39)
LDL Chol Calc (NIH): 106 mg/dL — ABNORMAL HIGH (ref 0–99)
Triglycerides: 323 mg/dL — ABNORMAL HIGH (ref 0–149)
VLDL Cholesterol Cal: 55 mg/dL — ABNORMAL HIGH (ref 5–40)

## 2020-12-16 LAB — CBC WITH DIFFERENTIAL/PLATELET
Basophils Absolute: 0.1 10*3/uL (ref 0.0–0.2)
Basos: 1 %
EOS (ABSOLUTE): 0.3 10*3/uL (ref 0.0–0.4)
Eos: 4 %
Hematocrit: 45.8 % (ref 34.0–46.6)
Hemoglobin: 15.2 g/dL (ref 11.1–15.9)
Immature Grans (Abs): 0 10*3/uL (ref 0.0–0.1)
Immature Granulocytes: 0 %
Lymphocytes Absolute: 2.2 10*3/uL (ref 0.7–3.1)
Lymphs: 26 %
MCH: 30.6 pg (ref 26.6–33.0)
MCHC: 33.2 g/dL (ref 31.5–35.7)
MCV: 92 fL (ref 79–97)
Monocytes Absolute: 0.6 10*3/uL (ref 0.1–0.9)
Monocytes: 7 %
Neutrophils Absolute: 5.3 10*3/uL (ref 1.4–7.0)
Neutrophils: 62 %
Platelets: 258 10*3/uL (ref 150–450)
RBC: 4.97 x10E6/uL (ref 3.77–5.28)
RDW: 12.6 % (ref 11.7–15.4)
WBC: 8.5 10*3/uL (ref 3.4–10.8)

## 2020-12-16 LAB — TSH: TSH: 2.44 u[IU]/mL (ref 0.450–4.500)

## 2020-12-16 LAB — HEMOGLOBIN A1C
Est. average glucose Bld gHb Est-mCnc: 174 mg/dL
Hgb A1c MFr Bld: 7.7 % — ABNORMAL HIGH (ref 4.8–5.6)

## 2020-12-16 MED ORDER — ICOSAPENT ETHYL 1 G PO CAPS: 2.0000 g | ORAL_CAPSULE | Freq: Two times a day (BID) | ORAL | 2 refills | Status: DC

## 2020-12-26 LAB — HM DIABETES EYE EXAM

## 2021-01-29 ENCOUNTER — Other Ambulatory Visit: Payer: Self-pay | Admitting: Internal Medicine

## 2021-01-29 DIAGNOSIS — F418 Other specified anxiety disorders: Secondary | ICD-10-CM

## 2021-01-29 DIAGNOSIS — E118 Type 2 diabetes mellitus with unspecified complications: Secondary | ICD-10-CM

## 2021-01-29 NOTE — Telephone Encounter (Signed)
Requested Prescriptions  Pending Prescriptions Disp Refills  . JANUVIA 100 MG tablet [Pharmacy Med Name: JANUVIA 100 MG TABLET] 90 tablet 1    Sig: TAKE 1 TABLET BY [place] DAY     Endocrinology:  Diabetes - DPP-4 Inhibitors Failed - 01/29/2021  9:25 AM      Failed - Cr in normal range and within 360 days    Creatinine, Ser  Date Value Ref Range Status  12/15/2020 1.04 (H) 0.57 - 1.00 mg/dL Final         Passed - HBA1C is between 0 and 7.9 and within 180 days    Hgb A1c MFr Bld  Date Value Ref Range Status  12/15/2020 7.7 (H) 4.8 - 5.6 % Final    Comment:             Prediabetes: 5.7 - 6.4          Diabetes: >6.4          Glycemic control for adults with diabetes: <7.0          Passed - Valid encounter within last 6 months    Recent Outpatient Visits          1 month ago Annual physical exam   [facility] Glean Hess, MD   4 months ago Back muscle spasm   [facility] Glean Hess, MD   8 months ago Type II diabetes mellitus with complication [facility])   [facility] Glean Hess, MD   1 year ago Annual physical exam   [facility] Glean Hess, MD   1 year ago Depression with anxiety   [facility] Glean Hess, MD      Future Appointments            In 2 months Army Melia Jesse Sans, MD [facility] [place]   In 10 months Glean Hess, MD [facility] [place]           . escitalopram (LEXAPRO) 10 MG tablet [Pharmacy Med Name: ESCITALOPRAM 10 MG TABLET] 90 tablet 1    Sig: TAKE 1 TABLET BY [place]     Psychiatry:  Antidepressants - SSRI Passed - 01/29/2021  9:25 AM      Passed - Completed PHQ-2 or PHQ-9 in the last 360 days      Passed - Valid encounter within last 6 months    Recent Outpatient Visits          1 month ago Annual physical exam   [facility] Glean Hess, MD   4 months ago Back muscle spasm   [facility] Glean Hess, MD    8 months ago Type II diabetes mellitus with complication [facility])   [facility] Glean Hess, MD   1 year ago Annual physical exam   [facility] Glean Hess, MD   1 year ago Depression with anxiety   [facility] Glean Hess, MD      Future Appointments            In 2 months Army Melia Jesse Sans, MD [facility], [place]   In 10 months Army Melia Jesse Sans, MD [facility], [facility]

## 2021-03-21 ENCOUNTER — Ambulatory Visit
Admission: RE | Admit: 2021-03-21 | Discharge: 2021-03-21 | Disposition: A | Discharge status: Home / Self Care | Payer: No Typology Code available for payment source | Source: Ambulatory Visit | Attending: Internal Medicine | Admitting: Internal Medicine

## 2021-03-21 ENCOUNTER — Other Ambulatory Visit: Payer: Self-pay

## 2021-03-21 DIAGNOSIS — Z1231 Encounter for screening mammogram for malignant neoplasm of breast: Secondary | ICD-10-CM | POA: Insufficient documentation

## 2021-04-01 ENCOUNTER — Encounter: Payer: Self-pay | Admitting: Orthopedic Surgery

## 2021-04-04 ENCOUNTER — Other Ambulatory Visit: Payer: Self-pay | Admitting: Internal Medicine

## 2021-04-04 DIAGNOSIS — E118 Type 2 diabetes mellitus with unspecified complications: Secondary | ICD-10-CM

## 2021-04-20 ENCOUNTER — Ambulatory Visit: Payer: 59 | Admitting: Internal Medicine

## 2021-04-27 ENCOUNTER — Other Ambulatory Visit: Payer: Self-pay | Admitting: Internal Medicine

## 2021-04-28 ENCOUNTER — Ambulatory Visit (INDEPENDENT_AMBULATORY_CARE_PROVIDER_SITE_OTHER): Payer: 59 | Admitting: Internal Medicine

## 2021-04-28 ENCOUNTER — Encounter: Payer: Self-pay | Admitting: Internal Medicine

## 2021-04-28 ENCOUNTER — Other Ambulatory Visit: Payer: Self-pay

## 2021-04-28 VITALS — BP 124/78 | HR 76 | Ht 66.0 in | Wt 309.6 lb

## 2021-04-28 DIAGNOSIS — E1169 Type 2 diabetes mellitus with other specified complication: Secondary | ICD-10-CM

## 2021-04-28 DIAGNOSIS — E118 Type 2 diabetes mellitus with unspecified complications: Secondary | ICD-10-CM

## 2021-04-28 DIAGNOSIS — E785 Hyperlipidemia, unspecified: Secondary | ICD-10-CM

## 2021-04-28 DIAGNOSIS — F909 Attention-deficit hyperactivity disorder, unspecified type: Secondary | ICD-10-CM | POA: Diagnosis not present

## 2021-04-28 LAB — POCT GLYCOSYLATED HEMOGLOBIN (HGB A1C)
Est. average glucose Bld gHb Est-mCnc: 186
Hemoglobin A1C: 8.1 % — AB (ref 4.0–5.6)

## 2021-04-28 MED ORDER — NYSTATIN 100000 UNIT/GM EX CREA: 1.0000 "application " | TOPICAL_CREAM | Freq: Two times a day (BID) | CUTANEOUS | 0 refills | Status: DC

## 2021-04-28 MED ORDER — FLUCONAZOLE 100 MG PO TABS: 100.0000 mg | ORAL_TABLET | Freq: Every day | ORAL | 0 refills | Status: AC

## 2021-04-28 NOTE — Progress Notes (Signed)
Date:  04/28/2021   Name:  Consepcion Macias   DOB:  [birth year]   MRN:  [id number]   Chief Complaint: Diabetes and Hyperlipidemia (Patient says she has not been taking Vascepa, and only takes her pravastatin "sometimes.")  Diabetes She presents for her follow-up diabetic visit. She has type 2 diabetes mellitus. Her disease course has been stable. Pertinent negatives for hypoglycemia include no headaches or tremors. Pertinent negatives for diabetes include no chest pain, no fatigue, no polydipsia and no polyuria. Current diabetic treatment includes oral agent (dual therapy) Mary Macias and farxiga).  Hyperlipidemia This is a chronic problem. The problem is uncontrolled. Pertinent negatives include no chest pain or shortness of breath. Current antihyperlipidemic treatment includes statins (pravachol: re-tried vascepa last visit).  ADHD - recently started Ritalin for sx relief and is doing better.  Lab Results  Component Value Date   CREATININE 1.04 (H) 12/15/2020   BUN 12 12/15/2020   NA 137 12/15/2020   K 4.3 12/15/2020   CL 96 12/15/2020   CO2 23 12/15/2020   Lab Results  Component Value Date   CHOL 206 (H) 12/15/2020   HDL 45 12/15/2020   LDLCALC 106 (H) 12/15/2020   TRIG 323 (H) 12/15/2020   CHOLHDL 4.6 (H) 12/15/2020   Lab Results  Component Value Date   TSH 2.440 12/15/2020   Lab Results  Component Value Date   HGBA1C 8.1 (A) 04/28/2021   Lab Results  Component Value Date   WBC 8.5 12/15/2020   HGB 15.2 12/15/2020   HCT 45.8 12/15/2020   MCV 92 12/15/2020   PLT 258 12/15/2020   Lab Results  Component Value Date   ALT 21 12/15/2020   AST 18 12/15/2020   ALKPHOS 70 12/15/2020   BILITOT 0.6 12/15/2020    Review of Systems  Constitutional:  Negative for appetite change, fatigue, fever and unexpected weight change.  HENT:  Negative for tinnitus and trouble swallowing.   Eyes:  Negative for visual disturbance.  Respiratory:  Negative for cough, chest  tightness and shortness of breath.   Cardiovascular:  Negative for chest pain, palpitations and leg swelling.  Gastrointestinal:  Negative for abdominal pain.  Endocrine: Negative for polydipsia and polyuria.  Genitourinary:  Negative for dysuria and hematuria.  Musculoskeletal:  Negative for arthralgias.  Neurological:  Negative for tremors, numbness and headaches.  Psychiatric/Behavioral:  Negative for dysphoric mood.    Patient Active Problem List   Diagnosis Date Noted   Adult ADHD 04/28/2021   Hx of total hysterectomy 12/15/2020   Nontraumatic complete tear of right rotator cuff 12/15/2020   OSA (obstructive sleep apnea) 02/02/2020   Elevated liver function tests 11/22/2019   Depression with anxiety 08/07/2019   Peripheral edema 02/08/2018   BMI 50.0-59.9, adult (Mary Macias) 01/25/2018   Colon polyp, hyperplastic 07/16/2017   Type II diabetes mellitus with complication ([place]) 63/10/6008   Hyperlipidemia associated with type 2 diabetes mellitus ([place]) 07/08/2015   Environmental and seasonal allergies 07/08/2015   Avitaminosis D 07/08/2015   Beat, premature ventricular 02/11/2015    Allergies  Allergen Reactions   Peanut-Containing Drug Products Hives and Swelling    Uses EPI-PEN As Needed for this.   Atorvastatin Other (See Comments)    Severe body aches   Meloxicam Other (See Comments)    Kidney function reduced    Povidone Iodine Rash   Cephalexin Rash   Metformin And Related Diarrhea   Trulicity [Dulaglutide]     Abdominal discomfort, flatus  Past Surgical History:  Procedure Laterality Date   CHOLECYSTECTOMY     COLONOSCOPY WITH PROPOFOL N/A 07/13/2017   Procedure: COLONOSCOPY WITH PROPOFOL;  Surgeon: Mary Sails, MD;  Location: [facility] ENDOSCOPY;  Service: Endoscopy;  Laterality: N/A;   NASAL SINUS SURGERY     OOPHORECTOMY     SHOULDER ARTHROSCOPY WITH ROTATOR CUFF REPAIR AND SUBACROMIAL DECOMPRESSION Right 11/22/2020   Procedure: Right shoulder arthroscopic  rotator cuff repair, subacromial decompression, and biceps tenodesis ,distal clavicle excision;  Surgeon: Mary Fabry, MD;  Location: [facility] ORS;  Service: Orthopedics;  Laterality: Right;   TOTAL VAGINAL HYSTERECTOMY     TUBAL LIGATION      Social History   Tobacco Use   Smoking status: Never   Smokeless tobacco: Never  Vaping Use   Vaping Use: Never used  Substance Use Topics   Alcohol use: No    Alcohol/week: 0.0 standard drinks   Drug use: No     Medication list has been reviewed and updated.  Current Meds  Medication Sig   acetaminophen (TYLENOL) 500 MG tablet Take 2 tablets (1,000 mg total) by mouth every 8 (eight) hours.   acyclovir ointment (ZOVIRAX) 5 % Apply 1 application topically every 3 (three) hours. (Patient taking differently: Apply 1 application topically daily as needed (cold sores).)   Cholecalciferol (DIALYVITE VITAMIN D 5000) 125 MCG (5000 UT) capsule Take 5,000 Units by mouth 3 (three) times a week.   Continuous Blood Gluc Receiver (FREESTYLE LIBRE 14 DAY READER) DEVI 1 each by Does not apply route 5 (five) times daily.   Continuous Blood Gluc Sensor (FREESTYLE LIBRE 14 DAY SENSOR) MISC Apply 1 each topically every 14 (fourteen) days. Use to test BS 5 times+ per day   Continuous Blood Gluc Sensor (FREESTYLE LIBRE 14 DAY SENSOR) MISC CHANGE EVERY 14 DAYS TO CHECK BLOOD SUGAR FIVE TIMES DAILY   EPINEPHrine 0.3 mg/0.3 mL IJ SOAJ injection Inject 0.3 mLs (0.3 mg total) into the muscle as needed for anaphylaxis.   escitalopram (LEXAPRO) 10 MG tablet TAKE 1 TABLET BY MOUTH EVERY DAY   famotidine-calcium carbonate-magnesium hydroxide (PEPCID COMPLETE) 10-800-165 MG chewable tablet Chew 1 tablet by mouth daily as needed (indigestion).   FARXIGA 10 MG TABS tablet TAKE 1 TABLET BY MOUTH DAILY BEFORE BREAKFAST.   fexofenadine (ALLEGRA) 180 MG tablet Take by mouth.   fluconazole (DIFLUCAN) 100 MG tablet Take 1 tablet (100 mg total) by mouth daily for 7 days.   JANUVIA 100  MG tablet TAKE 1 TABLET BY MOUTH EVERY DAY   methylphenidate (RITALIN LA) 10 MG 24 hr capsule Take 10 mg by mouth daily as needed.   NON FORMULARY at bedtime. Auto Cpap   ONETOUCH VERIO test strip USE UP TO 4 TIMES DAILY AS DIRECTED   valACYclovir (VALTREX) 1000 MG tablet Take 1,000 mg by mouth as needed (cold sores).   [DISCONTINUED] nystatin cream (MYCOSTATIN) Apply 1 application topically 2 (two) times daily. (Patient taking differently: Apply 1 application topically as needed (yeast).)    PHQ 2/9 Scores 04/28/2021 12/15/2020 09/29/2020 06/03/2020  PHQ - 2 Score 1 4 0 2  PHQ- 9 Score 5 8 1 3     GAD 7 : Generalized Anxiety Score 04/28/2021 12/15/2020 09/29/2020 06/03/2020  Nervous, Anxious, on Edge 2 2 1 1   Control/stop worrying 2 2 1 1   Worry too much - different things 2 2 1 1   Trouble relaxing 2 0 0 1  Restless 0 0 0 0  Easily annoyed or irritable 1  0 0 3  Afraid - awful might happen 1 0 0 0  Total GAD 7 Score 10 6 3 7   Anxiety Difficulty Somewhat difficult Not difficult at all - Not difficult at all    BP Readings from Last 3 Encounters:  04/28/21 124/78  12/15/20 126/78  11/22/20 (!) 147/79    Physical Exam Vitals and nursing note reviewed.  Constitutional:      General: She is not in acute distress.    Appearance: She is well-developed.  HENT:     Head: Normocephalic and atraumatic.  Cardiovascular:     Rate and Rhythm: Normal rate and regular rhythm.     Pulses: Normal pulses.     Heart sounds: No murmur heard. Pulmonary:     Effort: Pulmonary effort is normal. No respiratory distress.  Skin:    General: Skin is warm and dry.     Capillary Refill: Capillary refill takes less than 2 seconds.     Findings: No rash.  Neurological:     General: No focal deficit present.     Mental Status: She is alert and oriented to person, place, and time.  Psychiatric:        Mood and Affect: Mood normal.        Behavior: Behavior normal.    Wt Readings from Last 3 Encounters:   04/28/21 (!) 309 lb 9.6 oz (140.4 kg)  12/15/20 (!) 307 lb (139.3 kg)  11/22/20 (!) 312 lb (141.5 kg)    BP 124/78 (BP Location: Left Wrist, Patient Position: Sitting, Cuff Size: Normal)   Pulse 76   Ht 5\' 6"  (1.676 m)   Wt (!) 309 lb 9.6 oz (140.4 kg)   SpO2 96%   BMI 49.97 kg/m   Assessment and Plan: 1. Type II diabetes mellitus with complication (HCC) Worsening control likely related to diet non compliance Continue Farxiga and Januvia Sample of Ryblesus given - call for 7 mg Rx after one month - POCT glycosylated hemoglobin (Hb A1C) = 8.1 up from 7.7  2. Hyperlipidemia associated with type 2 diabetes mellitus ([place]) Not compliant with Pravachol - takes only occasionally Not take Vascepa for unclear reasons even though she has it frozen Urged her to take pravachol with other meds earlier in the day if this will improve compliance  3. Adult ADHD Now on Ritalin from virtual psych. Hoping to increase the dose and this helps with appetite suppression   Partially dictated using Editor, commissioning. Any errors are unintentional.  Halina Maidens, MD [facility]  04/28/2021

## 2021-04-28 NOTE — Patient Instructions (Signed)
Start Ryblesus 3 mg once a day  Call for a new Rx for 7 mg if tolerated

## 2021-05-01 ENCOUNTER — Encounter: Payer: Self-pay | Admitting: Internal Medicine

## 2021-05-23 ENCOUNTER — Other Ambulatory Visit: Payer: Self-pay | Admitting: Internal Medicine

## 2021-05-23 ENCOUNTER — Encounter: Payer: Self-pay | Admitting: Internal Medicine

## 2021-05-23 DIAGNOSIS — E118 Type 2 diabetes mellitus with unspecified complications: Secondary | ICD-10-CM

## 2021-05-23 MED ORDER — RYBELSUS 7 MG PO TABS: 7.0000 mg | ORAL_TABLET | Freq: Every day | ORAL | 0 refills | Status: DC

## 2021-05-23 NOTE — Telephone Encounter (Signed)
Please review.  KP

## 2021-06-22 ENCOUNTER — Other Ambulatory Visit: Payer: Self-pay | Admitting: Internal Medicine

## 2021-06-22 DIAGNOSIS — E118 Type 2 diabetes mellitus with unspecified complications: Secondary | ICD-10-CM

## 2021-06-22 NOTE — Telephone Encounter (Signed)
Requested medications are due for refill today.  yes  Requested medications are on the active medications list.  yes  Last refill. 05/23/2021  Future visit scheduled.   yes  Notes to clinic.  No protocol for this medication.

## 2021-06-24 ENCOUNTER — Telehealth: Payer: Self-pay

## 2021-06-24 ENCOUNTER — Other Ambulatory Visit: Payer: Self-pay | Admitting: Internal Medicine

## 2021-06-24 DIAGNOSIS — E118 Type 2 diabetes mellitus with unspecified complications: Secondary | ICD-10-CM

## 2021-06-24 MED ORDER — RYBELSUS 14 MG PO TABS: 14.0000 mg | ORAL_TABLET | Freq: Every day | ORAL | 1 refills | Status: DC

## 2021-06-24 NOTE — Telephone Encounter (Signed)
Copied from [place] [id number]. Topic: General - Inquiry >> Jun 24, 2021  9:08 AM Greggory Keen D wrote: Reason for CRM: Pt called saying she is good to go up to the 14 mg of Rybellus.  She ask that it be sent to [facility].

## 2021-06-24 NOTE — Telephone Encounter (Signed)
Please review.  KP

## 2021-07-16 ENCOUNTER — Other Ambulatory Visit: Payer: Self-pay | Admitting: Internal Medicine

## 2021-07-16 DIAGNOSIS — F418 Other specified anxiety disorders: Secondary | ICD-10-CM

## 2021-07-17 NOTE — Telephone Encounter (Signed)
Requested Prescriptions  Pending Prescriptions Disp Refills  . escitalopram (LEXAPRO) 10 MG tablet [Pharmacy Med Name: ESCITALOPRAM 10 MG TABLET] 90 tablet 1    Sig: TAKE 1 TABLET BY MOUTH EVERY DAY     Psychiatry:  Antidepressants - SSRI Passed - 07/16/2021  6:48 PM      Passed - Completed PHQ-2 or PHQ-9 in the last 360 days      Passed - Valid encounter within last 6 months    Recent Outpatient Visits          2 months ago Type II diabetes mellitus with complication [facility])   [facility] Glean Hess, MD   7 months ago Annual physical exam   [facility] Glean Hess, MD   9 months ago Back muscle spasm   [facility] Glean Hess, MD   1 year ago Type II diabetes mellitus with complication [facility])   [facility] Glean Hess, MD   1 year ago Annual physical exam   [facility] Glean Hess, MD      Future Appointments            In 1 month Army Melia Jesse Sans, MD [facility], [place]   In 5 months Army Melia, Jesse Sans, MD [facility], [facility]

## 2021-07-20 ENCOUNTER — Encounter: Payer: Self-pay | Admitting: Internal Medicine

## 2021-07-23 ENCOUNTER — Other Ambulatory Visit: Payer: Self-pay | Admitting: Internal Medicine

## 2021-07-23 DIAGNOSIS — E118 Type 2 diabetes mellitus with unspecified complications: Secondary | ICD-10-CM

## 2021-07-23 NOTE — Telephone Encounter (Signed)
The original prescription was discontinued on 05/23/2021 by Glean Hess, MD for the following reason: Change in therapy. Renewing this prescription may not be appropriate.   Called [facility] and spoke with Ashwani Pharm D. Advised her that Januvia was d/c'd. Requested Prescriptions  Pending Prescriptions Disp Refills   JANUVIA 100 MG tablet [Pharmacy Med Name: JANUVIA 100 MG TABLET] 90 tablet 1    Sig: TAKE 1 TABLET BY [place] DAY     Endocrinology:  Diabetes - DPP-4 Inhibitors Failed - 07/23/2021 10:30 AM      Failed - HBA1C is between 0 and 7.9 and within 180 days    Hemoglobin A1C  Date Value Ref Range Status  04/28/2021 8.1 (A) 4.0 - 5.6 % Final   Hgb A1c MFr Bld  Date Value Ref Range Status  12/15/2020 7.7 (H) 4.8 - 5.6 % Final    Comment:             Prediabetes: 5.7 - 6.4          Diabetes: >6.4          Glycemic control for adults with diabetes: <7.0           Failed - Cr in normal range and within 360 days    Creatinine, Ser  Date Value Ref Range Status  12/15/2020 1.04 (H) 0.57 - 1.00 mg/dL Final          Passed - Valid encounter within last 6 months    Recent Outpatient Visits           2 months ago Type II diabetes mellitus with complication [facility])   [facility] Glean Hess, MD   7 months ago Annual physical exam   [facility] Glean Hess, MD   9 months ago Back muscle spasm   [facility] Glean Hess, MD   1 year ago Type II diabetes mellitus with complication [facility])   [facility] Glean Hess, MD   1 year ago Annual physical exam   [facility] Glean Hess, MD       Future Appointments             In 1 month Army Melia Jesse Sans, MD [facility], [place]   In 5 months Army Melia, Jesse Sans, MD [facility], [facility]

## 2021-07-27 ENCOUNTER — Encounter: Payer: Self-pay | Admitting: Internal Medicine

## 2021-07-27 ENCOUNTER — Other Ambulatory Visit: Payer: Self-pay | Admitting: Internal Medicine

## 2021-08-09 ENCOUNTER — Other Ambulatory Visit: Payer: Self-pay | Admitting: Internal Medicine

## 2021-08-09 NOTE — Telephone Encounter (Signed)
Requested Prescriptions  Pending Prescriptions Disp Refills  . Continuous Blood Gluc Sensor (FREESTYLE LIBRE 14 DAY SENSOR) MISC [Pharmacy Med Name: FREESTYLE LIBRE 14 DAY SENSOR] 2 each 5    Sig: CHANGE EVERY 53 DAYS TO CHECK BLOOD SUGAR FIVE TIMES DAILY     Endocrinology: Diabetes - Testing Supplies Passed - 08/09/2021 11:53 AM      Passed - Valid encounter within last 12 months    Recent Outpatient Visits          3 months ago Type II diabetes mellitus with complication [facility])   [facility] Glean Hess, MD   7 months ago Annual physical exam   [facility] Glean Hess, MD   10 months ago Back muscle spasm   [facility] Glean Hess, MD   1 year ago Type II diabetes mellitus with complication [facility])   [facility] Glean Hess, MD   1 year ago Annual physical exam   [facility] Glean Hess, MD      Future Appointments            In 3 weeks Army Melia Jesse Sans, MD [facility], [place]   In 4 months Army Melia Jesse Sans, MD [facility], [facility]

## 2021-08-29 ENCOUNTER — Other Ambulatory Visit: Payer: Self-pay

## 2021-08-29 ENCOUNTER — Ambulatory Visit (INDEPENDENT_AMBULATORY_CARE_PROVIDER_SITE_OTHER): Payer: 59 | Admitting: Internal Medicine

## 2021-08-29 ENCOUNTER — Encounter: Payer: Self-pay | Admitting: Internal Medicine

## 2021-08-29 VITALS — BP 138/84 | HR 78 | Ht 66.0 in | Wt 307.0 lb

## 2021-08-29 DIAGNOSIS — E785 Hyperlipidemia, unspecified: Secondary | ICD-10-CM | POA: Diagnosis not present

## 2021-08-29 DIAGNOSIS — E118 Type 2 diabetes mellitus with unspecified complications: Secondary | ICD-10-CM

## 2021-08-29 DIAGNOSIS — B009 Herpesviral infection, unspecified: Secondary | ICD-10-CM | POA: Diagnosis not present

## 2021-08-29 DIAGNOSIS — E1169 Type 2 diabetes mellitus with other specified complication: Secondary | ICD-10-CM | POA: Diagnosis not present

## 2021-08-29 LAB — POCT GLYCOSYLATED HEMOGLOBIN (HGB A1C): Hemoglobin A1C: 7.5 % — AB (ref 4.0–5.6)

## 2021-08-29 MED ORDER — ACYCLOVIR 5 % EX OINT: 1.0000 "application " | TOPICAL_OINTMENT | Freq: Every day | CUTANEOUS | 0 refills | Status: DC | PRN

## 2021-08-29 MED ORDER — VALACYCLOVIR HCL 1 G PO TABS: 1000.0000 mg | ORAL_TABLET | ORAL | 0 refills | Status: DC | PRN

## 2021-08-29 MED ORDER — FENOFIBRATE 145 MG PO TABS: 145.0000 mg | ORAL_TABLET | Freq: Every day | ORAL | 1 refills | Status: DC

## 2021-08-29 MED ORDER — RYBELSUS 3 MG PO TABS: 3.0000 mg | ORAL_TABLET | Freq: Every day | ORAL | 1 refills | Status: DC

## 2021-08-29 NOTE — Progress Notes (Signed)
Date:  08/29/2021   Name:  Mary Macias   DOB:  1966/08/07   MRN:  [id number]   Chief Complaint: Diabetes and Hyperlipidemia  Diabetes She presents for her follow-up diabetic visit. She has type 2 diabetes mellitus. Her disease course has been improving. Pertinent negatives for hypoglycemia include no headaches, nervousness/anxiousness or tremors. Pertinent negatives for diabetes include no chest pain, no fatigue, no polydipsia and no polyuria. Current diabetic treatment includes oral agent (dual therapy) (farxiga and Rybelsus started last visit but having nausea(stopped rybelsus and restarted [place] about 3 weeks ago)). Her breakfast blood glucose is taken between 6-7 am. Her breakfast blood glucose range is generally 110-130 mg/dl. An ACE inhibitor/angiotensin II receptor blocker is not being taken. Eye exam is current.  Hyperlipidemia This is a chronic problem. The problem is uncontrolled. Recent lipid tests were reviewed and are high. Pertinent negatives include no chest pain or shortness of breath. Current antihyperlipidemic treatment includes statins (pravachol/tried Vascepa in the past).   Lab Results  Component Value Date   CREATININE 1.04 (H) 12/15/2020   BUN 12 12/15/2020   NA 137 12/15/2020   K 4.3 12/15/2020   CL 96 12/15/2020   CO2 23 12/15/2020   Lab Results  Component Value Date   CHOL 206 (H) 12/15/2020   HDL 45 12/15/2020   LDLCALC 106 (H) 12/15/2020   TRIG 323 (H) 12/15/2020   CHOLHDL 4.6 (H) 12/15/2020   Lab Results  Component Value Date   TSH 2.440 12/15/2020   Lab Results  Component Value Date   HGBA1C 8.1 (A) 04/28/2021   Lab Results  Component Value Date   WBC 8.5 12/15/2020   HGB 15.2 12/15/2020   HCT 45.8 12/15/2020   MCV 92 12/15/2020   PLT 258 12/15/2020   Lab Results  Component Value Date   ALT 21 12/15/2020   AST 18 12/15/2020   ALKPHOS 70 12/15/2020   BILITOT 0.6 12/15/2020   No components found for: VITD  Review of  Systems  Constitutional:  Negative for appetite change, fatigue, fever and unexpected weight change.  HENT:  Negative for tinnitus and trouble swallowing.   Eyes:  Negative for visual disturbance.  Respiratory:  Negative for cough, chest tightness and shortness of breath.   Cardiovascular:  Negative for chest pain, palpitations and leg swelling.  Gastrointestinal:  Negative for abdominal pain, diarrhea, nausea and vomiting.  Endocrine: Negative for polydipsia and polyuria.  Genitourinary:  Negative for dysuria and hematuria.  Musculoskeletal:  Negative for arthralgias.  Neurological:  Negative for tremors, numbness and headaches.  Psychiatric/Behavioral:  Negative for dysphoric mood and sleep disturbance. The patient is not nervous/anxious.    Patient Active Problem List   Diagnosis Date Noted   Adult ADHD 04/28/2021   Hx of total hysterectomy 12/15/2020   Nontraumatic complete tear of right rotator cuff 12/15/2020   OSA (obstructive sleep apnea) 02/02/2020   Elevated liver function tests 11/22/2019   Depression with anxiety 08/07/2019   Peripheral edema 02/08/2018   BMI 50.0-59.9, adult ([place]) 01/25/2018   Colon polyp, hyperplastic 07/16/2017   Type II diabetes mellitus with complication ([place]) 00/17/4944   Hyperlipidemia associated with type 2 diabetes mellitus ([place]) 07/08/2015   Environmental and seasonal allergies 07/08/2015   Avitaminosis D 07/08/2015   Beat, premature ventricular 02/11/2015    Allergies  Allergen Reactions   Peanut-Containing Drug Products Hives and Swelling    Uses EPI-PEN As Needed for this.   Atorvastatin Other (See Comments)  Severe body aches   Meloxicam Other (See Comments)    Kidney function reduced    Povidone Iodine Rash   Cephalexin Rash   Metformin And Related Diarrhea   Trulicity [Dulaglutide]     Abdominal discomfort, flatus    Past Surgical History:  Procedure Laterality Date   CHOLECYSTECTOMY     COLONOSCOPY WITH PROPOFOL N/A  07/13/2017   Procedure: COLONOSCOPY WITH PROPOFOL;  Surgeon: Lollie Sails, MD;  Location: [facility] ENDOSCOPY;  Service: Endoscopy;  Laterality: N/A;   NASAL SINUS SURGERY     OOPHORECTOMY     SHOULDER ARTHROSCOPY WITH ROTATOR CUFF REPAIR AND SUBACROMIAL DECOMPRESSION Right 11/22/2020   Procedure: Right shoulder arthroscopic rotator cuff repair, subacromial decompression, and biceps tenodesis ,distal clavicle excision;  Surgeon: Leim Fabry, MD;  Location: [facility] ORS;  Service: Orthopedics;  Laterality: Right;   TOTAL VAGINAL HYSTERECTOMY     TUBAL LIGATION      Social History   Tobacco Use   Smoking status: Never   Smokeless tobacco: Never  Vaping Use   Vaping Use: Never used  Substance Use Topics   Alcohol use: No    Alcohol/week: 0.0 standard drinks   Drug use: No     Medication list has been reviewed and updated.  Current Meds  Medication Sig   acetaminophen (TYLENOL) 500 MG tablet Take 2 tablets (1,000 mg total) by mouth every 8 (eight) hours.   acyclovir ointment (ZOVIRAX) 5 % Apply 1 application topically every 3 (three) hours. (Patient taking differently: Apply 1 application topically daily as needed (cold sores).)   atomoxetine (STRATTERA) 25 MG capsule Take 2 capsules by mouth every morning.   blood glucose meter kit and supplies Dispense based on patient and insurance preference. Use up to four times daily as directed. (FOR ICD-10 E10.9, E11.9).   Cholecalciferol (DIALYVITE VITAMIN D 5000) 125 MCG (5000 UT) capsule Take 5,000 Units by mouth 3 (three) times a week.   Continuous Blood Gluc Receiver (FREESTYLE LIBRE 14 DAY READER) DEVI 1 each by Does not apply route 5 (five) times daily.   Continuous Blood Gluc Sensor (FREESTYLE LIBRE 14 DAY SENSOR) MISC Apply 1 each topically every 14 (fourteen) days. Use to test BS 5 times+ per day   Continuous Blood Gluc Sensor (FREESTYLE LIBRE 14 DAY SENSOR) MISC CHANGE EVERY 14 DAYS TO CHECK BLOOD SUGAR FIVE TIMES DAILY   Continuous  Blood Gluc Sensor (FREESTYLE LIBRE 14 DAY SENSOR) MISC CHANGE EVERY 14 DAYS TO CHECK BLOOD SUGAR FIVE TIMES DAILY   EPINEPHrine 0.3 mg/0.3 mL IJ SOAJ injection Inject 0.3 mLs (0.3 mg total) into the muscle as needed for anaphylaxis.   escitalopram (LEXAPRO) 10 MG tablet TAKE 1 TABLET BY MOUTH EVERY DAY   famotidine-calcium carbonate-magnesium hydroxide (PEPCID COMPLETE) 10-800-165 MG chewable tablet Chew 1 tablet by mouth daily as needed (indigestion).   FARXIGA 10 MG TABS tablet TAKE 1 TABLET BY MOUTH DAILY BEFORE BREAKFAST.   fexofenadine (ALLEGRA) 180 MG tablet Take by mouth.   NON FORMULARY at bedtime. Auto Cpap   nystatin cream (MYCOSTATIN) Apply 1 application topically 2 (two) times daily.   ONETOUCH VERIO test strip USE UP TO 4 TIMES DAILY AS DIRECTED   pravastatin (PRAVACHOL) 40 MG tablet Take 40 mg by mouth daily.   valACYclovir (VALTREX) 1000 MG tablet Take 1,000 mg by mouth as needed (cold sores).    PHQ 2/9 Scores 04/28/2021 12/15/2020 09/29/2020 06/03/2020  PHQ - 2 Score 1 4 0 2  PHQ- 9 Score 5 8  1 3    GAD 7 : Generalized Anxiety Score 04/28/2021 12/15/2020 09/29/2020 06/03/2020  Nervous, Anxious, on Edge 2 2 1 1   Control/stop worrying 2 2 1 1   Worry too much - different things 2 2 1 1   Trouble relaxing 2 0 0 1  Restless 0 0 0 0  Easily annoyed or irritable 1 0 0 3  Afraid - awful might happen 1 0 0 0  Total GAD 7 Score 10 6 3 7   Anxiety Difficulty Somewhat difficult Not difficult at all - Not difficult at all    BP Readings from Last 3 Encounters:  08/29/21 138/84  04/28/21 124/78  12/15/20 126/78    Physical Exam Vitals and nursing note reviewed.  Constitutional:      General: She is not in acute distress.    Appearance: She is well-developed.  HENT:     Head: Normocephalic and atraumatic.  Pulmonary:     Effort: Pulmonary effort is normal. No respiratory distress.  Skin:    General: Skin is warm and dry.     Findings: No rash.  Neurological:     Mental  Status: She is alert and oriented to person, place, and time.  Psychiatric:        Mood and Affect: Mood normal.        Behavior: Behavior normal.    Wt Readings from Last 3 Encounters:  08/29/21 (!) 307 lb (139.3 kg)  04/28/21 (!) 309 lb 9.6 oz (140.4 kg)  12/15/20 (!) 307 lb (139.3 kg)    BP 138/84   Pulse 78   Ht 5' 6"  (1.676 m)   Wt (!) 307 lb (139.3 kg)   SpO2 98%   BMI 49.55 kg/m   Assessment and Plan: 1. Type II diabetes mellitus with complication (HCC) Blood sugar improved.  Unable to tolerate Rybelsus 14 mg but 3 mg was tolerated. Stop Januvia that was started 3 weeks ago, continue Farxiga and restart Rybelsus 3 mg. - POCT glycosylated hemoglobin (Hb A1C) - Semaglutide (RYBELSUS) 3 MG TABS; Take 3 mg by mouth daily.  Dispense: 90 tablet; Refill: 1  2. Hyperlipidemia associated with type 2 diabetes mellitus (HCC) Continue Pravachol 40 mg. Add fibric acid for triglycerides - fenofibrate (TRICOR) 145 MG tablet; Take 1 tablet (145 mg total) by mouth daily.  Dispense: 90 tablet; Refill: 1  3. Herpes simplex infection - valACYclovir (VALTREX) 1000 MG tablet; Take 1 tablet (1,000 mg total) by mouth as needed (cold sores).  Dispense: 30 tablet; Refill: 0 - acyclovir ointment (ZOVIRAX) 5 %; Apply 1 application topically daily as needed (cold sores).  Dispense: 15 g; Refill: 0   Partially dictated using Editor, commissioning. Any errors are unintentional.  Halina Maidens, MD [facility]  08/29/2021

## 2021-08-30 ENCOUNTER — Ambulatory Visit: Payer: 59 | Admitting: Internal Medicine

## 2021-09-15 ENCOUNTER — Ambulatory Visit: Payer: Self-pay | Admitting: *Deleted

## 2021-09-15 NOTE — Telephone Encounter (Signed)
C/o flu like symptoms since 4 days ago with fever, body aches, profuse sweating since Tuesday and elevated blood sugars. C/o soaking through sheets and clothes .Blood sugars have been running in the 200-300's . Now 163 at 12:13 pm today . Fever 99.2 this am early and now 97. Reports feeling better continues with body aches cough better. C/o urine odor strong, fruity smell, concentrated, "foamy"since 2 days ago . Denies abdominal pain, some back pain no burning during urination no frequency. Appt scheduled for 09/16/21. Do you want to change to VV? Please advise . Care advise given. Patient verbalized understanding of care advise and to call back or go to [facility] or ED if symptoms worsen .

## 2021-09-15 NOTE — Telephone Encounter (Signed)
Reason for Disposition  Bad or foul-smelling urine    Strong odor , fruity smell  Answer Assessment - Initial Assessment Questions 1. SYMPTOM: "What's the main symptom you're concerned about?" (e.g., frequency, incontinence)     Urine smells fruity, strong, looks concentrated 2. ONSET: "When did the symptoms   start?"     Today  3. PAIN: "Is there any pain?" If Yes, ask: "How bad is it?" (Scale: 1-10; mild, moderate, severe)     No  4. CAUSE: "What do you think is causing the symptoms?"     Elevated blood sugar, flu symptoms  5. OTHER SYMPTOMS: "Do you have any other symptoms?" (e.g., fever, flank pain, blood in urine, pain with urination)     No fever now , profuse sweating soaking sheets since Tuesday night  6. PREGNANCY: "Is there any chance you are pregnant?" "When was your last menstrual period?"     na  Protocols used: Urinary Symptoms-A-AH

## 2021-09-16 ENCOUNTER — Encounter: Payer: Self-pay | Admitting: Internal Medicine

## 2021-09-16 ENCOUNTER — Ambulatory Visit (INDEPENDENT_AMBULATORY_CARE_PROVIDER_SITE_OTHER): Payer: 59 | Admitting: Internal Medicine

## 2021-09-16 ENCOUNTER — Other Ambulatory Visit: Payer: Self-pay

## 2021-09-16 VITALS — BP 138/80 | HR 83 | Temp 97.6°F | Ht 66.0 in | Wt 303.0 lb

## 2021-09-16 DIAGNOSIS — B379 Candidiasis, unspecified: Secondary | ICD-10-CM

## 2021-09-16 DIAGNOSIS — J069 Acute upper respiratory infection, unspecified: Secondary | ICD-10-CM | POA: Diagnosis not present

## 2021-09-16 LAB — POCT URINALYSIS DIPSTICK
Bilirubin, UA: NEGATIVE
Blood, UA: NEGATIVE
Glucose, UA: POSITIVE — AB
Ketones, UA: NEGATIVE
Leukocytes, UA: NEGATIVE
Nitrite, UA: NEGATIVE
Protein, UA: NEGATIVE
Spec Grav, UA: 1.02 (ref 1.010–1.025)
Urobilinogen, UA: 0.2 E.U./dL
pH, UA: 6 (ref 5.0–8.0)

## 2021-09-16 MED ORDER — FLUCONAZOLE 100 MG PO TABS
100.0000 mg | ORAL_TABLET | Freq: Every day | ORAL | 0 refills | Status: AC
Start: 2021-09-16 — End: 2021-09-23

## 2021-09-16 NOTE — Progress Notes (Signed)
Date:  09/16/2021   Name:  Mary Macias   DOB:  July 06, 1966   MRN:  [id number]   Chief Complaint: Sore Throat and Diabetes (Sweet smelling urine, foamy urine,206 BS)  Sore Throat  This is a new (X1 week) problem. The problem has been gradually improving. The maximum temperature recorded prior to her arrival was 101 - 101.9 F. The fever has been present for 1 to 2 days. The pain is at a severity of 4/10. The pain is moderate. Associated symptoms include congestion, coughing, diarrhea, headaches, a hoarse voice and neck pain. Pertinent negatives include no shortness of breath, stridor, trouble swallowing or vomiting. Associated symptoms comments: Joint pain, sweats. She has tried acetaminophen (OTC medications) for the symptoms. The treatment provided mild relief.   Lab Results  Component Value Date   NA 137 12/15/2020   K 4.3 12/15/2020   CO2 23 12/15/2020   GLUCOSE 133 (H) 12/15/2020   BUN 12 12/15/2020   CREATININE 1.04 (H) 12/15/2020   CALCIUM 9.4 12/15/2020   EGFR 63 12/15/2020   GFRNONAA 78 09/29/2020   Lab Results  Component Value Date   CHOL 206 (H) 12/15/2020   HDL 45 12/15/2020   LDLCALC 106 (H) 12/15/2020   TRIG 323 (H) 12/15/2020   CHOLHDL 4.6 (H) 12/15/2020   Lab Results  Component Value Date   TSH 2.440 12/15/2020   Lab Results  Component Value Date   HGBA1C 7.5 (A) 08/29/2021   Lab Results  Component Value Date   WBC 8.5 12/15/2020   HGB 15.2 12/15/2020   HCT 45.8 12/15/2020   MCV 92 12/15/2020   PLT 258 12/15/2020   Lab Results  Component Value Date   ALT 21 12/15/2020   AST 18 12/15/2020   ALKPHOS 70 12/15/2020   BILITOT 0.6 12/15/2020   No results found for: 25OHVITD2, 25OHVITD3, VD25OH   Review of Systems  Constitutional:  Negative for chills, fatigue and fever.  HENT:  Positive for congestion, hoarse voice and sore throat. Negative for trouble swallowing.   Respiratory:  Positive for cough. Negative for chest tightness, shortness of  breath and stridor.   Cardiovascular:  Negative for chest pain and palpitations.  Gastrointestinal:  Positive for diarrhea. Negative for constipation and vomiting.  Musculoskeletal:  Positive for neck pain.  Neurological:  Positive for headaches.  Psychiatric/Behavioral:  Negative for dysphoric mood and sleep disturbance. The patient is not nervous/anxious.    Patient Active Problem List   Diagnosis Date Noted   Adult ADHD 04/28/2021   Hx of total hysterectomy 12/15/2020   Nontraumatic complete tear of right rotator cuff 12/15/2020   OSA (obstructive sleep apnea) 02/02/2020   Elevated liver function tests 11/22/2019   Depression with anxiety 08/07/2019   Peripheral edema 02/08/2018   BMI 50.0-59.9, adult ([place]) 01/25/2018   Colon polyp, hyperplastic 07/16/2017   Type II diabetes mellitus with complication ([place]) 67/34/1937   Hyperlipidemia associated with type 2 diabetes mellitus ([place]) 07/08/2015   Environmental and seasonal allergies 07/08/2015   Avitaminosis D 07/08/2015   Beat, premature ventricular 02/11/2015    Allergies  Allergen Reactions   Peanut-Containing Drug Products Hives and Swelling    Uses EPI-PEN As Needed for this.   Atorvastatin Other (See Comments)    Severe body aches   Meloxicam Other (See Comments)    Kidney function reduced    Povidone Iodine Rash   Cephalexin Rash   Metformin And Related Diarrhea   Trulicity [Dulaglutide]  Abdominal discomfort, flatus    Past Surgical History:  Procedure Laterality Date   CHOLECYSTECTOMY     COLONOSCOPY WITH PROPOFOL N/A 07/13/2017   Procedure: COLONOSCOPY WITH PROPOFOL;  Surgeon: Lollie Sails, MD;  [facility];  Service: Endoscopy;  Laterality: N/A;   NASAL SINUS SURGERY     OOPHORECTOMY     SHOULDER ARTHROSCOPY WITH ROTATOR CUFF REPAIR AND SUBACROMIAL DECOMPRESSION Right 11/22/2020   Procedure: Right shoulder arthroscopic rotator cuff repair, subacromial decompression, and biceps tenodesis  ,distal clavicle excision;  Surgeon: Leim Fabry, MD;  Location: [facility] ORS;  Service: Orthopedics;  Laterality: Right;   TOTAL VAGINAL HYSTERECTOMY     TUBAL LIGATION      Social History   Tobacco Use   Smoking status: Never   Smokeless tobacco: Never  Vaping Use   Vaping Use: Never used  Substance Use Topics   Alcohol use: No    Alcohol/week: 0.0 standard drinks   Drug use: No     Medication list has been reviewed and updated.  Current Meds  Medication Sig   acetaminophen (TYLENOL) 500 MG tablet Take 2 tablets (1,000 mg total) by mouth every 8 (eight) hours.   acyclovir ointment (ZOVIRAX) 5 % Apply 1 application topically daily as needed (cold sores).   atomoxetine (STRATTERA) 25 MG capsule Take 2 capsules by mouth every morning.   blood glucose meter kit and supplies Dispense based on patient and insurance preference. Use up to four times daily as directed. (FOR ICD-10 E10.9, E11.9).   Cholecalciferol (DIALYVITE VITAMIN D 5000) 125 MCG (5000 UT) capsule Take 5,000 Units by mouth 3 (three) times a week.   Continuous Blood Gluc Receiver (FREESTYLE LIBRE 14 DAY READER) DEVI 1 each by Does not apply route 5 (five) times daily.   Continuous Blood Gluc Sensor (FREESTYLE LIBRE 14 DAY SENSOR) MISC CHANGE EVERY 14 DAYS TO CHECK BLOOD SUGAR FIVE TIMES DAILY   EPINEPHrine 0.3 mg/0.3 mL IJ SOAJ injection Inject 0.3 mLs (0.3 mg total) into the muscle as needed for anaphylaxis.   escitalopram (LEXAPRO) 10 MG tablet TAKE 1 TABLET BY MOUTH EVERY DAY (Patient taking differently: 5 mg.)   famotidine-calcium carbonate-magnesium hydroxide (PEPCID COMPLETE) 10-800-165 MG chewable tablet Chew 1 tablet by mouth daily as needed (indigestion).   FARXIGA 10 MG TABS tablet TAKE 1 TABLET BY MOUTH DAILY BEFORE BREAKFAST.   fenofibrate (TRICOR) 145 MG tablet Take 1 tablet (145 mg total) by mouth daily.   fexofenadine (ALLEGRA) 180 MG tablet Take by mouth.   NON FORMULARY at bedtime. Auto Cpap   nystatin  cream (MYCOSTATIN) Apply 1 application topically 2 (two) times daily.   ONETOUCH VERIO test strip USE UP TO 4 TIMES DAILY AS DIRECTED   pravastatin (PRAVACHOL) 40 MG tablet Take 40 mg by mouth daily.   Semaglutide (RYBELSUS) 3 MG TABS Take 3 mg by mouth daily.   valACYclovir (VALTREX) 1000 MG tablet Take 1 tablet (1,000 mg total) by mouth as needed (cold sores).    PHQ 2/9 Scores 08/29/2021 04/28/2021 12/15/2020 09/29/2020  PHQ - 2 Score 0 1 4 0  PHQ- 9 Score 4 5 8 1     GAD 7 : Generalized Anxiety Score 08/29/2021 04/28/2021 12/15/2020 09/29/2020  Nervous, Anxious, on Edge 0 2 2 1   Control/stop worrying 0 2 2 1   Worry too much - different things 0 2 2 1   Trouble relaxing 0 2 0 0  Restless 1 0 0 0  Easily annoyed or irritable 0 1 0 0  Afraid - awful might happen 0 1 0 0  Total GAD 7 Score 1 10 6 3   Anxiety Difficulty Not difficult at all Somewhat difficult Not difficult at all -    BP Readings from Last 3 Encounters:  09/16/21 138/80  08/29/21 138/84  04/28/21 124/78    Physical Exam Vitals and nursing note reviewed.  Constitutional:      General: She is not in acute distress.    Appearance: She is well-developed.     Comments: Appears fatigued  HENT:     Head: Normocephalic and atraumatic.     Right Ear: Tympanic membrane and ear canal normal.     Left Ear: Tympanic membrane and ear canal normal.     Nose:     Right Sinus: No maxillary sinus tenderness or frontal sinus tenderness.     Left Sinus: No maxillary sinus tenderness or frontal sinus tenderness.     Mouth/Throat:     Tonsils: No tonsillar exudate or tonsillar abscesses.  Eyes:     Conjunctiva/sclera: Conjunctivae normal.  Cardiovascular:     Rate and Rhythm: Normal rate and regular rhythm.  Pulmonary:     Effort: Pulmonary effort is normal. No respiratory distress.     Breath sounds: No wheezing or rhonchi.  Musculoskeletal:     Cervical back: Normal range of motion.  Skin:    General: Skin is warm and dry.      Findings: No rash.  Neurological:     Mental Status: She is alert and oriented to person, place, and time.  Psychiatric:        Mood and Affect: Mood normal.        Behavior: Behavior normal.    Wt Readings from Last 3 Encounters:  09/16/21 (!) 303 lb (137.4 kg)  08/29/21 (!) 307 lb (139.3 kg)  04/28/21 (!) 309 lb 9.6 oz (140.4 kg)    BP 138/80   Pulse 83   Temp 97.6 F (36.4 C) (Oral)   Ht 5' 6"  (1.676 m)   Wt (!) 303 lb (137.4 kg)   SpO2 97%   BMI 48.91 kg/m   Assessment and Plan: 1. Viral URI with cough Symptoms are improving with conservative care Continue fluids, rest No indication for antibiotics at this time  2. Candida infection Likely secondary to Farxiga UA benign  - fluconazole (DIFLUCAN) 100 MG tablet; Take 1 tablet (100 mg total) by mouth daily for 7 days.  Dispense: 7 tablet; Refill: 0 - POCT urinalysis dipstick   Partially dictated using Editor, commissioning. Any errors are unintentional.  Halina Maidens, MD [facility]  09/16/2021

## 2021-09-25 ENCOUNTER — Other Ambulatory Visit: Payer: Self-pay | Admitting: Internal Medicine

## 2021-09-25 DIAGNOSIS — B009 Herpesviral infection, unspecified: Secondary | ICD-10-CM

## 2021-09-26 NOTE — Telephone Encounter (Signed)
Requested Prescriptions  Pending Prescriptions Disp Refills   valACYclovir (VALTREX) 1000 MG tablet [Pharmacy Med Name: VALACYCLOVIR HCL 1 GRAM TABLET] 30 tablet 0    Sig: TAKE 1 TABLET (1,000 MG TOTAL) BY MOUTH AS NEEDED (COLD SORES).     Antimicrobials:  Antiviral Agents - Anti-Herpetic Passed - 09/25/2021  2:32 PM      Passed - Valid encounter within last 12 months    Recent Outpatient Visits          1 week ago Viral URI with cough   [facility] Glean Hess, MD   4 weeks ago Type II diabetes mellitus with complication [facility])   [facility] Glean Hess, MD   5 months ago Type II diabetes mellitus with complication [facility])   [facility] Glean Hess, MD   9 months ago Annual physical exam   [facility] Glean Hess, MD   12 months ago Back muscle spasm   [facility] Glean Hess, MD      Future Appointments            In 2 months Army Melia Jesse Sans, MD [facility], [facility]

## 2021-10-03 ENCOUNTER — Telehealth: Payer: No Typology Code available for payment source | Admitting: Family

## 2021-10-03 DIAGNOSIS — J019 Acute sinusitis, unspecified: Secondary | ICD-10-CM | POA: Diagnosis not present

## 2021-10-03 MED ORDER — AMOXICILLIN-POT CLAVULANATE 875-125 MG PO TABS
1.0000 | ORAL_TABLET | Freq: Two times a day (BID) | ORAL | 0 refills | Status: DC
Start: 2021-10-03 — End: 2021-10-15

## 2021-10-03 NOTE — Progress Notes (Signed)
Virtual Visit Consent   Mary Macias, you are scheduled for a virtual visit with a [facility] provider today.     Just as with appointments in the office, your consent must be obtained to participate.  Your consent will be active for this visit and any virtual visit you may have with one of our providers in the next 365 days.     If you have a MyChart account, a copy of this consent can be sent to you electronically.  All virtual visits are billed to your insurance company just like a traditional visit in the office.    As this is a virtual visit, video technology does not allow for your provider to perform a traditional examination.  This may limit your provider's ability to fully assess your condition.  If your provider identifies any concerns that need to be evaluated in person or the need to arrange testing (such as labs, EKG, etc.), we will make arrangements to do so.     Although advances in technology are sophisticated, we cannot ensure that it will always work on either your end or our end.  If the connection with a video visit is poor, the visit may have to be switched to a telephone visit.  With either a video or telephone visit, we are not always able to ensure that we have a secure connection.     I need to obtain your verbal consent now.   Are you willing to proceed with your visit today?    Mary Macias has provided verbal consent on 10/03/2021 for a virtual visit (video or telephone).   Evelina Dun, FNP   Date: 10/03/2021 10:09 AM   Virtual Visit via Video Note   I, Evelina Dun, connected with  Mary Macias  ([id number], [birth year]) on 10/03/21 at 10:00 AM EST by a video-enabled telemedicine application and verified that I am speaking with the correct person using two identifiers.  Location: Patient: Virtual Visit Location Patient: Home Provider: Virtual Visit Location Provider: Home Office   I discussed the limitations of evaluation and  management by telemedicine and the availability of in person appointments. The patient expressed understanding and agreed to proceed.    History of Present Illness: Mary Macias is a 55 y.o. who identifies as a female who was assigned female at birth, and is being seen today for sinus infection. She reports she had flu 09/13/21 and her symptoms improved, but is not having sinus pressure.   HPI: Sinus Problem This is a new problem. The current episode started 1 to 4 weeks ago. The problem has been gradually worsening since onset. There has been no fever. Her pain is at a severity of 3/10. The pain is mild. Associated symptoms include congestion, coughing, headaches, sinus pressure, sneezing and a sore throat. Pertinent negatives include no ear pain. Past treatments include oral decongestants. The treatment provided mild relief.   Problems:  Patient Active Problem List   Diagnosis Date Noted   Adult ADHD 04/28/2021   Hx of total hysterectomy 12/15/2020   Nontraumatic complete tear of right rotator cuff 12/15/2020   OSA (obstructive sleep apnea) 02/02/2020   Elevated liver function tests 11/22/2019   Depression with anxiety 08/07/2019   Peripheral edema 02/08/2018   BMI 50.0-59.9, adult ([name]) 01/25/2018   Colon polyp, hyperplastic 07/16/2017   Type II diabetes mellitus with complication ([name]) 94/70/9628   Hyperlipidemia associated with type 2 diabetes mellitus ([name]) 07/08/2015   Environmental and seasonal allergies  07/08/2015   Avitaminosis D 07/08/2015   Beat, premature ventricular 02/11/2015    Allergies:  Allergies  Allergen Reactions   Peanut-Containing Drug Products Hives and Swelling    Uses EPI-PEN As Needed for this.   Atorvastatin Other (See Comments)    Severe body aches   Meloxicam Other (See Comments)    Kidney function reduced    Povidone Iodine Rash   Cephalexin Rash   Metformin And Related Diarrhea   Trulicity [Dulaglutide]     Abdominal discomfort, flatus    Medications:  Current Outpatient Medications:    amoxicillin-clavulanate (AUGMENTIN) 875-125 MG tablet, Take 1 tablet by mouth 2 (two) times daily., Disp: 14 tablet, Rfl: 0   acetaminophen (TYLENOL) 500 MG tablet, Take 2 tablets (1,000 mg total) by mouth every 8 (eight) hours., Disp: 90 tablet, Rfl: 2   acyclovir ointment (ZOVIRAX) 5 %, Apply 1 application topically daily as needed (cold sores)., Disp: 15 g, Rfl: 0   atomoxetine (STRATTERA) 25 MG capsule, Take 2 capsules by mouth every morning., Disp: , Rfl:    blood glucose meter kit and supplies, Dispense based on patient and insurance preference. Use up to four times daily as directed. (FOR ICD-10 E10.9, E11.9)., Disp: 1 each, Rfl: 0   Cholecalciferol (DIALYVITE VITAMIN D 5000) 125 MCG (5000 UT) capsule, Take 5,000 Units by mouth 3 (three) times a week., Disp: , Rfl:    Continuous Blood Gluc Receiver (FREESTYLE LIBRE 14 DAY READER) DEVI, 1 each by Does not apply route 5 (five) times daily., Disp: 1 each, Rfl: 0   Continuous Blood Gluc Sensor (FREESTYLE LIBRE 14 DAY SENSOR) MISC, CHANGE EVERY 14 DAYS TO CHECK BLOOD SUGAR FIVE TIMES DAILY, Disp: 2 each, Rfl: 5   EPINEPHrine 0.3 mg/0.3 mL IJ SOAJ injection, Inject 0.3 mLs (0.3 mg total) into the muscle as needed for anaphylaxis., Disp: 1 each, Rfl: 1   escitalopram (LEXAPRO) 10 MG tablet, TAKE 1 TABLET BY MOUTH EVERY DAY (Patient taking differently: 5 mg.), Disp: 90 tablet, Rfl: 1   famotidine-calcium carbonate-magnesium hydroxide (PEPCID COMPLETE) 10-800-165 MG chewable tablet, Chew 1 tablet by mouth daily as needed (indigestion)., Disp: , Rfl:    FARXIGA 10 MG TABS tablet, TAKE 1 TABLET BY MOUTH DAILY BEFORE BREAKFAST., Disp: 30 tablet, Rfl: 6   fenofibrate (TRICOR) 145 MG tablet, Take 1 tablet (145 mg total) by mouth daily., Disp: 90 tablet, Rfl: 1   fexofenadine (ALLEGRA) 180 MG tablet, Take by mouth., Disp: , Rfl:    NON FORMULARY, at bedtime. Auto Cpap, Disp: , Rfl:    nystatin cream  (MYCOSTATIN), Apply 1 application topically 2 (two) times daily., Disp: 30 g, Rfl: 0   ONETOUCH VERIO test strip, USE UP TO 4 TIMES DAILY AS DIRECTED, Disp: 100 strip, Rfl: 3   pravastatin (PRAVACHOL) 40 MG tablet, Take 40 mg by mouth daily., Disp: , Rfl:    Semaglutide (RYBELSUS) 3 MG TABS, Take 3 mg by mouth daily., Disp: 90 tablet, Rfl: 1   valACYclovir (VALTREX) 1000 MG tablet, TAKE 1 TABLET (1,000 MG TOTAL) BY MOUTH AS NEEDED (COLD SORES)., Disp: 30 tablet, Rfl: 0  Observations/Objective: Patient is well-developed, well-nourished in no acute distress.  Resting comfortably  at home.  Head is normocephalic, atraumatic.  No labored breathing.  Speech is clear and coherent with logical content.  Patient is alert and oriented at baseline.  Nasal congestion  Assessment and Plan: 1. Acute sinusitis, recurrence not specified, unspecified location - amoxicillin-clavulanate (AUGMENTIN) 875-125 MG tablet; Take 1  tablet by mouth 2 (two) times daily.  Dispense: 14 tablet; Refill: 0  - Take meds as prescribed - Use a cool mist humidifier  -Use saline nose sprays frequently -Force fluids -For any cough or congestion  Use plain Mucinex- regular strength or max strength is fine -For fever or aces or pains- take tylenol or ibuprofen. -Throat lozenges if help -Follow up if symptoms worsen or do not improve   Follow Up Instructions: I discussed the assessment and treatment plan with the patient. The patient was provided an opportunity to ask questions and all were answered. The patient agreed with the plan and demonstrated an understanding of the instructions.  A copy of instructions were sent to the patient via MyChart unless otherwise noted below.     The patient was advised to call back or seek an in-person evaluation if the symptoms worsen or if the condition fails to improve as anticipated.  Time:  I spent 12 minutes with the patient via telehealth technology discussing the above  problems/concerns.    Evelina Dun, FNP

## 2021-10-15 ENCOUNTER — Other Ambulatory Visit: Payer: Self-pay

## 2021-10-15 ENCOUNTER — Ambulatory Visit
Admission: EM | Admit: 2021-10-15 | Discharge: 2021-10-15 | Disposition: A | Discharge status: Home / Self Care | Payer: No Typology Code available for payment source | Attending: Emergency Medicine | Admitting: Emergency Medicine

## 2021-10-15 ENCOUNTER — Encounter: Payer: Self-pay | Admitting: Emergency Medicine

## 2021-10-15 DIAGNOSIS — J0101 Acute recurrent maxillary sinusitis: Secondary | ICD-10-CM | POA: Diagnosis not present

## 2021-10-15 MED ORDER — DOXYCYCLINE HYCLATE 100 MG PO CAPS
100.0000 mg | ORAL_CAPSULE | Freq: Two times a day (BID) | ORAL | 0 refills | Status: AC
Start: 2021-10-15 — End: 2021-10-25

## 2021-10-15 NOTE — ED Provider Notes (Addendum)
[facility]    CSN: [id number] Arrival date & time: 10/15/21  1019      History   Chief Complaint Chief Complaint  Patient presents with   Nasal Congestion   Cough    HPI Mary Macias is a 56 y.o. female.   56 year old female ,Mary Macias, presents to urgent care chief complaint of nasal congestion cough ear pressure green sputum burning sinuses.  States she has had this for 3 weeks, was on Augmentin completed and still does not feel like she is improved denies fever or shortness of breath.  Has longstanding history of sinus problems seen by Dr. Kathyrn Sheriff, ENT.  The history is provided by the patient. No language interpreter was used.   Past Medical History:  Diagnosis Date   Allergy    Complication of anesthesia    slow to wake up   Diabetes mellitus without complication ([place])    Pneumonia    PONV (postoperative nausea and vomiting)    Sleep apnea    cpap   Stable angina pectoris ([place]) 08/10/2017    Patient Active Problem List   Diagnosis Date Noted   Acute recurrent maxillary sinusitis 10/15/2021   Adult ADHD 04/28/2021   Hx of total hysterectomy 12/15/2020   Nontraumatic complete tear of right rotator cuff 12/15/2020   OSA (obstructive sleep apnea) 02/02/2020   Elevated liver function tests 11/22/2019   Depression with anxiety 08/07/2019   Peripheral edema 02/08/2018   BMI 50.0-59.9, adult ([place]) 01/25/2018   Colon polyp, hyperplastic 07/16/2017   Type II diabetes mellitus with complication ([place]) 28/76/8115   Hyperlipidemia associated with type 2 diabetes mellitus ([place]) 07/08/2015   Environmental and seasonal allergies 07/08/2015   Avitaminosis D 07/08/2015   Beat, premature ventricular 02/11/2015    Past Surgical History:  Procedure Laterality Date   CHOLECYSTECTOMY     COLONOSCOPY WITH PROPOFOL N/A 07/13/2017   Procedure: COLONOSCOPY WITH PROPOFOL;  Surgeon: Lollie Sails, MD;  Location: [facility] ENDOSCOPY;  Service: Endoscopy;   Laterality: N/A;   NASAL SINUS SURGERY     OOPHORECTOMY     SHOULDER ARTHROSCOPY WITH ROTATOR CUFF REPAIR AND SUBACROMIAL DECOMPRESSION Right 11/22/2020   Procedure: Right shoulder arthroscopic rotator cuff repair, subacromial decompression, and biceps tenodesis ,distal clavicle excision;  Surgeon: Leim Fabry, MD;  Location: [facility] ORS;  Service: Orthopedics;  Laterality: Right;   TOTAL VAGINAL HYSTERECTOMY     TUBAL LIGATION      OB History   No obstetric history on file.      Home Medications    Prior to Admission medications   Medication Sig Start Date End Date Taking? Authorizing Provider  acetaminophen (TYLENOL) 500 MG tablet Take 2 tablets (1,000 mg total) by mouth every 8 (eight) hours. 11/22/20 11/22/21 Yes Leim Fabry, MD  acyclovir ointment (ZOVIRAX) 5 % Apply 1 application topically daily as needed (cold sores). 08/29/21  Yes Glean Hess, MD  atomoxetine (STRATTERA) 25 MG capsule Take 2 capsules by mouth every morning. 08/17/21  Yes [provider]  Cholecalciferol (DIALYVITE VITAMIN D 5000) 125 MCG (5000 UT) capsule Take 5,000 Units by mouth 3 (three) times a week.   Yes [provider]  doxycycline (VIBRAMYCIN) 100 MG capsule Take 1 capsule (100 mg total) by mouth 2 (two) times daily for 10 days. 10/15/21 05/04/19 Yes Cait Locust, Jeanett Schlein, NP  EPINEPHrine 0.3 mg/0.3 mL IJ SOAJ injection Inject 0.3 mLs (0.3 mg total) into the muscle as needed for anaphylaxis. 06/07/20  Yes Glean Hess,  MD  escitalopram (LEXAPRO) 10 MG tablet TAKE 1 TABLET BY MOUTH EVERY DAY Patient taking differently: 5 mg. 07/17/21  Yes Glean Hess, MD  famotidine-calcium carbonate-magnesium hydroxide (PEPCID COMPLETE) 10-800-165 MG chewable tablet Chew 1 tablet by mouth daily as needed (indigestion).   Yes [provider]  FARXIGA 10 MG TABS tablet TAKE 1 TABLET BY MOUTH DAILY BEFORE BREAKFAST. 04/04/21  Yes Glean Hess, MD  fenofibrate (TRICOR) 145 MG tablet Take 1  tablet (145 mg total) by mouth daily. 08/29/21  Yes Glean Hess, MD  fexofenadine (ALLEGRA) 180 MG tablet Take by mouth.   Yes [provider]  NON FORMULARY at bedtime. Auto Cpap   Yes [provider]  nystatin cream (MYCOSTATIN) Apply 1 application topically 2 (two) times daily. 04/28/21  Yes Glean Hess, MD  [facility] VERIO test strip USE UP TO 4 TIMES DAILY AS DIRECTED 07/05/20  Yes Glean Hess, MD  pravastatin (PRAVACHOL) 40 MG tablet Take 40 mg by mouth daily.   Yes [provider]  Semaglutide (RYBELSUS) 3 MG TABS Take 3 mg by mouth daily. 08/29/21  Yes Glean Hess, MD  valACYclovir (VALTREX) 1000 MG tablet TAKE 1 TABLET (1,000 MG TOTAL) BY MOUTH AS NEEDED (COLD SORES). 09/26/21  Yes Glean Hess, MD  blood glucose meter kit and supplies Dispense based on patient and insurance preference. Use up to four times daily as directed. (FOR ICD-10 E10.9, E11.9). 06/11/20   Glean Hess, MD  Continuous Blood Gluc Receiver (FREESTYLE LIBRE 14 DAY READER) DEVI 1 each by Does not apply route 5 (five) times daily. 09/29/20   Glean Hess, MD  Continuous Blood Gluc Sensor (FREESTYLE LIBRE Cable) MISC CHANGE EVERY 38 DAYS TO CHECK BLOOD SUGAR FIVE TIMES DAILY 08/09/21   Glean Hess, MD    Family History Family History  Problem Relation Age of Onset   Breast cancer Paternal Grandmother    Colon cancer Mother    Hypertension Mother    Diabetes Father    Hypertension Father    CAD Father    Hypertension Sister     Social History Social History   Tobacco Use   Smoking status: Never   Smokeless tobacco: Never  Vaping Use   Vaping Use: Never used  Substance Use Topics   Alcohol use: No    Alcohol/week: 0.0 standard drinks   Drug use: No     Allergies   Peanut-containing drug products, Atorvastatin, Meloxicam, Povidone iodine, Cephalexin, Metformin and related, and Trulicity [dulaglutide]   Review of  Systems Review of Systems  Constitutional:  Negative for fever.  HENT:  Positive for congestion, ear pain, postnasal drip, sinus pressure and sinus pain. Negative for facial swelling.   Respiratory:  Positive for cough. Negative for shortness of breath and wheezing.   Cardiovascular:  Negative for chest pain and palpitations.  All other systems reviewed and are negative.   Physical Exam Triage Vital Signs ED Triage Vitals  Enc Vitals Group     BP 10/15/21 1031 (!) 153/83     Pulse Rate 10/15/21 1031 88     Resp 10/15/21 1031 18     Temp 10/15/21 1031 98.2 F (36.8 C)     Temp Source 10/15/21 1031 Oral     SpO2 10/15/21 1031 99 %     Weight 10/15/21 1028 (!) 302 lb 14.6 oz (137.4 kg)     Height 10/15/21 1028 5' 6"  (1.676 m)  Head Circumference --      Peak Flow --      Pain Score 10/15/21 1028 4     Pain Loc --      Pain Edu? --      Excl. in [place]? --    No data found.  Updated Vital Signs BP (!) 153/83 (BP Location: Right Arm)    Pulse 88    Temp 98.2 F (36.8 C) (Oral)    Resp 18    Ht 5' 6"  (1.676 m)    Wt (!) 302 lb 14.6 oz (137.4 kg)    SpO2 99%    BMI 48.89 kg/m   Visual Acuity Right Eye Distance:   Left Eye Distance:   Bilateral Distance:    Right Eye Near:   Left Eye Near:    Bilateral Near:     Physical Exam Vitals and nursing note reviewed.  Constitutional:      General: She is not in acute distress.    Appearance: She is well-developed.  HENT:     Head: Normocephalic.     Right Ear: Tympanic membrane is retracted.     Left Ear: Tympanic membrane is retracted.     Nose: Congestion present.     Mouth/Throat:     Lips: Pink.     Mouth: Mucous membranes are moist.     Pharynx: Oropharynx is clear.  Eyes:     General: Lids are normal.     Extraocular Movements: Extraocular movements intact.     Conjunctiva/sclera: Conjunctivae normal.     Pupils: Pupils are equal, round, and reactive to light.  Neck:     Trachea: Trachea normal. No tracheal  deviation.  Cardiovascular:     Rate and Rhythm: Normal rate and regular rhythm.     Pulses: Normal pulses.     Heart sounds: Normal heart sounds. No murmur heard. Pulmonary:     Effort: Pulmonary effort is normal.     Breath sounds: Normal breath sounds and air entry.  Abdominal:     General: Bowel sounds are normal.     Palpations: Abdomen is soft.     Tenderness: There is no abdominal tenderness.  Musculoskeletal:        General: Normal range of motion.     Cervical back: Normal range of motion.  Lymphadenopathy:     Cervical: No cervical adenopathy.  Skin:    General: Skin is warm and dry.     Findings: No rash.  Neurological:     General: No focal deficit present.     Mental Status: She is alert and oriented to person, place, and time.     GCS: GCS eye subscore is 4. GCS verbal subscore is 5. GCS motor subscore is 6.  Psychiatric:        Attention and Perception: Attention normal.        Mood and Affect: Mood normal.        Speech: Speech normal.        Behavior: Behavior normal. Behavior is cooperative.     UC Treatments / Results  Labs (all labs ordered are listed, but only abnormal results are displayed) Labs Reviewed - No data to display  EKG   Radiology No results found.  Procedures Procedures (including critical care time)  Medications Ordered in UC Medications - No data to display  Initial Impression / Assessment and Plan / UC Course  I have reviewed the triage vital signs and the nursing notes.  Pertinent  labs & imaging results that were available during my care of the patient were reviewed by me and considered in my medical decision making (see chart for details).     Ddx: Acute URI, sinusitis, viral illness, allergies Final Clinical Impressions(s) / UC Diagnoses   Final diagnoses:  Acute recurrent maxillary sinusitis     Discharge Instructions      Rest, push fluids, take antibiotic as directed.  Drink plenty of fluids.  Keep a watch  on your blood sugar as illness may make it go up.  Follow-up with Dr. Kathyrn Sheriff, ENT for further evaluation and management.     ED Prescriptions     Medication Sig Dispense Auth. Provider   doxycycline (VIBRAMYCIN) 100 MG capsule Take 1 capsule (100 mg total) by mouth 2 (two) times daily for 10 days. 20 capsule Maryan Sivak, Jeanett Schlein, NP      PDMP not reviewed this encounter.   Tori Milks, NP 60/63/01 6010    Tori Milks, NP 93/23/55 1301

## 2021-10-15 NOTE — ED Triage Notes (Signed)
Pt c/o nasal congestion, cough, ear pressure, green sputum productive, burning sinuses. Started about christmas eve. She states she just finished Augmentin this past Wednesday. Denies fever or shortness of breath.

## 2021-10-15 NOTE — Discharge Instructions (Signed)
Rest, push fluids, take antibiotic as directed.  Drink plenty of fluids.  Keep a watch on your blood sugar as illness may make it go up.  Follow-up with Dr. Kathyrn Sheriff, ENT for further evaluation and management.

## 2021-10-25 ENCOUNTER — Other Ambulatory Visit: Payer: Self-pay | Admitting: Internal Medicine

## 2021-10-25 DIAGNOSIS — E118 Type 2 diabetes mellitus with unspecified complications: Secondary | ICD-10-CM

## 2021-10-25 DIAGNOSIS — B009 Herpesviral infection, unspecified: Secondary | ICD-10-CM

## 2021-10-25 NOTE — Telephone Encounter (Signed)
Requested Prescriptions  Pending Prescriptions Disp Refills   valACYclovir (VALTREX) 1000 MG tablet [Pharmacy Med Name: VALACYCLOVIR HCL 1 GRAM TABLET] 30 tablet 2    Sig: TAKE 1 TABLET (1,000 MG TOTAL) BY MOUTH AS NEEDED (COLD SORES).     Antimicrobials:  Antiviral Agents - Anti-Herpetic Passed - 10/25/2021  2:30 PM      Passed - Valid encounter within last 12 months    Recent Outpatient Visits          1 month ago Viral URI with cough   [facility] Glean Hess, MD   1 month ago Type II diabetes mellitus with complication [facility])   [facility] Glean Hess, MD   6 months ago Type II diabetes mellitus with complication [facility])   [facility] Glean Hess, MD   10 months ago Annual physical exam   [facility] Glean Hess, MD   1 year ago Back muscle spasm   [facility] Glean Hess, MD      Future Appointments            In 1 month Army Melia Jesse Sans, MD [facility], [facility]

## 2021-10-25 NOTE — Telephone Encounter (Signed)
Requested medication (s) are due for refill today: yes  Requested medication (s) are on the active medication list: yes  Last refill:  08/29/21 #90 1 RF  Future visit scheduled: yes  Notes to clinic:  med not assigned to a protocol   Requested Prescriptions  Pending Prescriptions Disp Refills   Semaglutide (RYBELSUS) 3 MG TABS 90 tablet 1    Sig: Take 3 mg by mouth daily.     Off-Protocol Failed - 10/25/2021 10:36 AM      Failed - Medication not assigned to a protocol, review manually.      Passed - Valid encounter within last 12 months    Recent Outpatient Visits           1 month ago Viral URI with cough   [facility] Glean Hess, MD   1 month ago Type II diabetes mellitus with complication [facility])   [facility] Glean Hess, MD   6 months ago Type II diabetes mellitus with complication [facility])   [facility] Glean Hess, MD   10 months ago Annual physical exam   [facility] Glean Hess, MD   1 year ago Back muscle spasm   [facility] Glean Hess, MD       Future Appointments             In 1 month Army Melia Jesse Sans, MD [facility], [facility]

## 2021-10-25 NOTE — Telephone Encounter (Signed)
Copied from [place] [id number]. Topic: Quick Communication - Rx Refill/Question >> Oct 25, 2021 10:02 AM Leward Quan A wrote: Medication: Semaglutide (RYBELSUS) 3 MG TABS  Per patient has new Rx insurance   Has the patient contacted their pharmacy? Yes.  Say they are waiting for information from Dr  (Agent: If no, request that the patient contact the pharmacy for the refill. If patient does not wish to contact the pharmacy document the reason why and proceed with request.) (Agent: If yes, when and what did the pharmacy advise?)  Preferred Pharmacy (with phone number or street name): [id number] - [place], [address]  Phone:  [phone] Fax:  [phone]    Has the patient been seen for an appointment in the last year OR does the patient have an upcoming appointment? Yes.    Agent: Please be advised that RX refills may take up to 3 business days. We ask that you follow-up with your pharmacy.

## 2021-10-26 MED ORDER — RYBELSUS 3 MG PO TABS: 3.0000 mg | ORAL_TABLET | Freq: Every day | ORAL | 1 refills | Status: DC

## 2021-10-26 NOTE — Telephone Encounter (Signed)
Noted, thank you

## 2021-11-01 ENCOUNTER — Telehealth: Payer: Self-pay | Admitting: Internal Medicine

## 2021-11-01 ENCOUNTER — Other Ambulatory Visit: Payer: Self-pay

## 2021-11-01 ENCOUNTER — Encounter: Payer: Self-pay | Admitting: Internal Medicine

## 2021-11-01 DIAGNOSIS — E118 Type 2 diabetes mellitus with unspecified complications: Secondary | ICD-10-CM

## 2021-11-01 MED ORDER — RYBELSUS 3 MG PO TABS: 3.0000 mg | ORAL_TABLET | Freq: Every day | ORAL | 1 refills | Status: DC

## 2021-11-01 NOTE — Telephone Encounter (Signed)
Rx was sent in on 01/18. Resent today with note to pharmacy that patient will continue on 3 mg dose daily. No dose increase.

## 2021-11-01 NOTE — Telephone Encounter (Signed)
Copied from [place] [id number]. Topic: General - Other >> Nov 01, 2021 11:25 AM Valere Dross wrote: Reason for CRM: Pt called in stating she is still waiting on her Rebelsus medication, she contacted the pharmacy but they told her they were still waiting on the approval to be sent over, please advise.

## 2021-11-03 ENCOUNTER — Telehealth: Payer: Self-pay

## 2021-11-03 NOTE — Telephone Encounter (Signed)
Key: [id number]  PA completed waiting for insurance approval.  KP

## 2021-11-04 ENCOUNTER — Other Ambulatory Visit: Payer: Self-pay | Admitting: Internal Medicine

## 2021-11-04 DIAGNOSIS — E118 Type 2 diabetes mellitus with unspecified complications: Secondary | ICD-10-CM

## 2021-11-04 NOTE — Telephone Encounter (Signed)
Requested Prescriptions  Pending Prescriptions Disp Refills   FARXIGA 10 MG TABS tablet [Pharmacy Med Name: FARXIGA 10 MG TABLET] 30 tablet 6    Sig: TAKE 1 TABLET BY MOUTH [place]     Endocrinology:  Diabetes - SGLT2 Inhibitors Failed - 11/04/2021  1:37 AM      Failed - Cr in normal range and within 360 days    Creatinine, Ser  Date Value Ref Range Status  12/15/2020 1.04 (H) 0.57 - 1.00 mg/dL Final         Failed - LDL in normal range and within 360 days    LDL Chol Calc (NIH)  Date Value Ref Range Status  12/15/2020 106 (H) 0 - 99 mg/dL Final         Passed - HBA1C is between 0 and 7.9 and within 180 days    Hemoglobin A1C  Date Value Ref Range Status  08/29/2021 7.5 (A) 4.0 - 5.6 % Final   Hgb A1c MFr Bld  Date Value Ref Range Status  12/15/2020 7.7 (H) 4.8 - 5.6 % Final    Comment:             Prediabetes: 5.7 - 6.4          Diabetes: >6.4          Glycemic control for adults with diabetes: <7.0          Passed - eGFR in normal range and within 360 days    GFR calc Af Amer  Date Value Ref Range Status  09/29/2020 90 >59 mL/min/1.73 Final    Comment:    **In accordance with recommendations from the NKF-ASN Task force,**   [facility] is in the process of updating its eGFR calculation to the   2021 CKD-EPI creatinine equation that estimates kidney function   without a race variable.    GFR calc non Af Amer  Date Value Ref Range Status  09/29/2020 78 >59 mL/min/1.73 Final   eGFR  Date Value Ref Range Status  12/15/2020 63 >59 mL/min/1.73 Final         Passed - Valid encounter within last 6 months    Recent Outpatient Visits          1 month ago Viral URI with cough   [facility] Glean Hess, MD   2 months ago Type II diabetes mellitus with complication [facility])   [facility] Glean Hess, MD   6 months ago Type II diabetes mellitus with complication [facility])   [facility] Glean Hess, MD   10  months ago Annual physical exam   [facility] Glean Hess, MD   1 year ago Back muscle spasm   [facility] Glean Hess, MD      Future Appointments            In 1 month Army Melia Jesse Sans, MD [facility], [facility]

## 2021-11-07 NOTE — Telephone Encounter (Signed)
Approved thru 11/01/2022.  KP

## 2021-11-18 ENCOUNTER — Telehealth: Payer: Self-pay | Admitting: Internal Medicine

## 2021-11-18 NOTE — Telephone Encounter (Signed)
Pt is calling to request a new script for dexcom meter. Pt received a letter from her insurance company stating that this is the preferred meter rather than the Continuous Blood Gluc Receiver (FREESTYLE LIBRE [place]) DEVI [[id number]]  [phone]  Preferred Pharmacy [facility] 

## 2021-11-18 NOTE — Telephone Encounter (Signed)
Pt is calling stating that she is needing a PA for FARXIGA 10 MG TABS tablet [[id number]]  CB- [phone]

## 2021-11-21 ENCOUNTER — Other Ambulatory Visit: Payer: Self-pay

## 2021-11-21 ENCOUNTER — Encounter: Payer: Self-pay | Admitting: Internal Medicine

## 2021-11-21 ENCOUNTER — Telehealth: Payer: Self-pay

## 2021-11-21 MED ORDER — DEXCOM G6 TRANSMITTER MISC: 1.0000 | Freq: Every day | 0 refills | Status: DC

## 2021-11-21 MED ORDER — DEXCOM G6 RECEIVER DEVI: 1.0000 | Freq: Every day | 0 refills | Status: DC

## 2021-11-21 MED ORDER — DEXCOM G6 SENSOR MISC: 5 refills | Status: DC

## 2021-11-21 NOTE — Telephone Encounter (Signed)
Completed PA for Dexcom receiver for patients insurance.   Key: [id number] - PA Case ID: [id number] - Rx #: [id number]  Waiting for Outcome.

## 2021-11-21 NOTE — Telephone Encounter (Signed)
Sent in [place] kit to [facility].

## 2021-11-21 NOTE — Telephone Encounter (Signed)
Will work on [place] today and inform patient.

## 2021-11-25 ENCOUNTER — Telehealth: Payer: Self-pay

## 2021-11-25 NOTE — Telephone Encounter (Signed)
Completed PA on Farxiga 10 MG in paper form. [facility] sent PA through [place]. Completed form and sent back.   Waiting for outcome.

## 2021-11-29 NOTE — Telephone Encounter (Signed)
Pt insurance declined Dexcom- patient informed.

## 2021-11-29 NOTE — Telephone Encounter (Signed)
Did not received anything back from insurance for PA so completed one online on [email or web address] KeyLebron Quam - PA Case ID: [id number]  Patient informed.

## 2021-12-02 NOTE — Telephone Encounter (Signed)
Patient PA approved through 11/25/2022.  PA# [id number]

## 2021-12-20 ENCOUNTER — Other Ambulatory Visit: Payer: Self-pay

## 2021-12-20 ENCOUNTER — Encounter: Payer: Self-pay | Admitting: Internal Medicine

## 2021-12-20 ENCOUNTER — Ambulatory Visit (INDEPENDENT_AMBULATORY_CARE_PROVIDER_SITE_OTHER): Payer: 59 | Admitting: Internal Medicine

## 2021-12-20 VITALS — BP 124/70 | HR 64 | Ht 66.0 in | Wt 306.0 lb

## 2021-12-20 DIAGNOSIS — E118 Type 2 diabetes mellitus with unspecified complications: Secondary | ICD-10-CM

## 2021-12-20 DIAGNOSIS — Z1211 Encounter for screening for malignant neoplasm of colon: Secondary | ICD-10-CM

## 2021-12-20 DIAGNOSIS — Z23 Encounter for immunization: Secondary | ICD-10-CM

## 2021-12-20 DIAGNOSIS — Z1231 Encounter for screening mammogram for malignant neoplasm of breast: Secondary | ICD-10-CM | POA: Diagnosis not present

## 2021-12-20 DIAGNOSIS — Z6841 Body Mass Index (BMI) 40.0 and over, adult: Secondary | ICD-10-CM

## 2021-12-20 DIAGNOSIS — Z0000 Encounter for general adult medical examination without abnormal findings: Secondary | ICD-10-CM | POA: Diagnosis not present

## 2021-12-20 DIAGNOSIS — F418 Other specified anxiety disorders: Secondary | ICD-10-CM

## 2021-12-20 DIAGNOSIS — E1169 Type 2 diabetes mellitus with other specified complication: Secondary | ICD-10-CM

## 2021-12-20 DIAGNOSIS — E559 Vitamin D deficiency, unspecified: Secondary | ICD-10-CM

## 2021-12-20 DIAGNOSIS — E785 Hyperlipidemia, unspecified: Secondary | ICD-10-CM | POA: Diagnosis not present

## 2021-12-20 LAB — POCT URINALYSIS DIPSTICK
Bilirubin, UA: NEGATIVE
Blood, UA: NEGATIVE
Glucose, UA: POSITIVE — AB
Ketones, UA: NEGATIVE
Leukocytes, UA: NEGATIVE
Nitrite, UA: NEGATIVE
Protein, UA: NEGATIVE
Spec Grav, UA: 1.01 (ref 1.010–1.025)
Urobilinogen, UA: 0.2 E.U./dL
pH, UA: 6.5 (ref 5.0–8.0)

## 2021-12-20 MED ORDER — FLUCONAZOLE 100 MG PO TABS: 100.0000 mg | ORAL_TABLET | Freq: Every day | ORAL | 0 refills | Status: DC

## 2021-12-20 NOTE — Progress Notes (Signed)
Date:  12/20/2021   Name:  Mary Macias   DOB:  December 24, 1965   MRN:  [id number]   Chief Complaint: Annual Exam (Breast Exam. Foot Exam. MICRO, and UA.) Mary Macias is a 56 y.o. female who presents today for her Complete Annual Exam. She feels well. She reports exercising. She reports she is sleeping well. Breast complaints - none.  Mammogram: 03/2021 DEXA: none Pap smear: discontinued Colonoscopy: 07/2017 repeat 10 yrs  Immunization History  Administered Date(s) Administered   Influenza,inj,Quad PF,6+ Mos 10/12/2015, 07/14/2016, 06/14/2018, 08/07/2019, 06/03/2020   Influenza-Unspecified 08/08/2017   PFIZER Comirnaty(Gray Top)Covid-19 Tri-Sucrose Vaccine 12/12/2019, 12/29/2019   PFIZER(Purple Top)SARS-COV-2 Vaccination 12/12/2019, 12/29/2019, 08/15/2020   Pfizer Covid-19 Vaccine Bivalent Booster 51yrs & up 08/23/2021   Pneumococcal Polysaccharide-23 01/25/2018   Tdap 01/07/2015   Zoster Recombinat (Shingrix) 11/17/2019, 01/19/2020    Diabetes She presents for her follow-up diabetic visit. She has type 2 diabetes mellitus. Pertinent negatives for hypoglycemia include no dizziness, headaches, nervousness/anxiousness or tremors. Pertinent negatives for diabetes include no chest pain, no fatigue, no polydipsia and no polyuria. Symptoms are stable. Current diabetic treatment includes oral agent (dual therapy) (farxiga and rybelsus). She is compliant with treatment all of the time. An ACE inhibitor/angiotensin II receptor blocker is not being taken. Eye exam is current.  Hyperlipidemia This is a chronic problem. Pertinent negatives include no chest pain or shortness of breath. Current antihyperlipidemic treatment includes statins and fibric acid derivatives. The current treatment provides moderate improvement of lipids.  Depression        This is a chronic problem.The problem is unchanged.  Associated symptoms include no fatigue and no headaches.  Past treatments include SSRIs -  Selective serotonin reuptake inhibitors.  Compliance with treatment is good.  Lab Results  Component Value Date   NA 137 12/15/2020   K 4.3 12/15/2020   CO2 23 12/15/2020   GLUCOSE 133 (H) 12/15/2020   BUN 12 12/15/2020   CREATININE 1.04 (H) 12/15/2020   CALCIUM 9.4 12/15/2020   EGFR 63 12/15/2020   GFRNONAA 78 09/29/2020   Lab Results  Component Value Date   CHOL 206 (H) 12/15/2020   HDL 45 12/15/2020   LDLCALC 106 (H) 12/15/2020   TRIG 323 (H) 12/15/2020   CHOLHDL 4.6 (H) 12/15/2020   Lab Results  Component Value Date   TSH 2.440 12/15/2020   Lab Results  Component Value Date   HGBA1C 7.5 (A) 08/29/2021   Lab Results  Component Value Date   WBC 8.5 12/15/2020   HGB 15.2 12/15/2020   HCT 45.8 12/15/2020   MCV 92 12/15/2020   PLT 258 12/15/2020   Lab Results  Component Value Date   ALT 21 12/15/2020   AST 18 12/15/2020   ALKPHOS 70 12/15/2020   BILITOT 0.6 12/15/2020   No results found for: 25OHVITD2, 25OHVITD3, VD25OH   Review of Systems  Constitutional:  Negative for chills, fatigue and fever.  HENT:  Negative for congestion, hearing loss, tinnitus, trouble swallowing and voice change.   Eyes:  Negative for visual disturbance.  Respiratory:  Negative for cough, chest tightness, shortness of breath and wheezing.   Cardiovascular:  Negative for chest pain, palpitations and leg swelling.  Gastrointestinal:  Negative for abdominal pain, constipation, diarrhea and vomiting.  Endocrine: Negative for polydipsia and polyuria.  Genitourinary:  Negative for dysuria, frequency, genital sores, vaginal bleeding and vaginal discharge.  Musculoskeletal:  Negative for arthralgias, gait problem and joint swelling.  Skin:  Negative for color  change and rash.  Neurological:  Negative for dizziness, tremors, light-headedness and headaches.  Hematological:  Negative for adenopathy. Does not bruise/bleed easily.  Psychiatric/Behavioral:  Positive for depression. Negative for  dysphoric mood and sleep disturbance. The patient is not nervous/anxious.    Patient Active Problem List   Diagnosis Date Noted   Acute recurrent maxillary sinusitis 10/15/2021   Adult ADHD 04/28/2021   Hx of total hysterectomy 12/15/2020   Nontraumatic complete tear of right rotator cuff 12/15/2020   OSA (obstructive sleep apnea) 02/02/2020   Elevated liver function tests 11/22/2019   Depression with anxiety 08/07/2019   Peripheral edema 02/08/2018   BMI 50.0-59.9, adult (HCC) 01/25/2018   Colon polyp, hyperplastic 07/16/2017   Type II diabetes mellitus with complication (HCC) 08/18/2015   Hyperlipidemia associated with type 2 diabetes mellitus (HCC) 07/08/2015   Environmental and seasonal allergies 07/08/2015   Avitaminosis D 07/08/2015   Beat, premature ventricular 02/11/2015    Allergies  Allergen Reactions   Peanut-Containing Drug Products Hives and Swelling    Uses EPI-PEN As Needed for this.   Atorvastatin Other (See Comments)    Severe body aches   Meloxicam Other (See Comments)    Kidney function reduced    Povidone Iodine Rash   Cephalexin Rash   Metformin And Related Diarrhea   Trulicity [Dulaglutide]     Abdominal discomfort, flatus    Past Surgical History:  Procedure Laterality Date   CHOLECYSTECTOMY     COLONOSCOPY WITH PROPOFOL N/A 07/13/2017   Procedure: COLONOSCOPY WITH PROPOFOL;  Surgeon: Christena Deem, MD;  Location: [facility] ENDOSCOPY;  Service: Endoscopy;  Laterality: N/A;   NASAL SINUS SURGERY     OOPHORECTOMY     SHOULDER ARTHROSCOPY WITH ROTATOR CUFF REPAIR AND SUBACROMIAL DECOMPRESSION Right 11/22/2020   Procedure: Right shoulder arthroscopic rotator cuff repair, subacromial decompression, and biceps tenodesis ,distal clavicle excision;  Surgeon: Signa Kell, MD;  Location: [facility] ORS;  Service: Orthopedics;  Laterality: Right;   TOTAL VAGINAL HYSTERECTOMY     TUBAL LIGATION      Social History   Tobacco Use   Smoking status: Never    Smokeless tobacco: Never  Vaping Use   Vaping Use: Never used  Substance Use Topics   Alcohol use: No    Alcohol/week: 0.0 standard drinks   Drug use: No     Medication list has been reviewed and updated.  Current Meds  Medication Sig   acyclovir ointment (ZOVIRAX) 5 % Apply 1 application topically daily as needed (cold sores).   atomoxetine (STRATTERA) 60 MG capsule Take 60 mg by mouth every morning.   Cholecalciferol (DIALYVITE VITAMIN D 5000) 125 MCG (5000 UT) capsule Take 5,000 Units by mouth 3 (three) times a week.   Continuous Blood Gluc Receiver (DEXCOM G6 RECEIVER) [place] 1 each by Does not apply route daily.   Continuous Blood Gluc Sensor (DEXCOM G6 SENSOR) MISC Use 1 Sensor every 10 days.   Continuous Blood Gluc Transmit (DEXCOM G6 TRANSMITTER) MISC 1 each by Does not apply route daily.   EPINEPHrine 0.3 mg/0.3 mL IJ SOAJ injection Inject 0.3 mLs (0.3 mg total) into the muscle as needed for anaphylaxis.   escitalopram (LEXAPRO) 10 MG tablet TAKE 1 TABLET BY MOUTH EVERY DAY (Patient taking differently: Take 10 mg by mouth daily.)   famotidine-calcium carbonate-magnesium hydroxide (PEPCID COMPLETE) 10-800-165 MG chewable tablet Chew 1 tablet by mouth daily as needed (indigestion).   FARXIGA 10 MG TABS tablet TAKE 1 TABLET BY MOUTH EVERY DAY BEFORE  BREAKFAST   fenofibrate (TRICOR) 145 MG tablet Take 1 tablet (145 mg total) by mouth daily.   fexofenadine (ALLEGRA) 180 MG tablet Take by mouth.   NON FORMULARY at bedtime. Auto Cpap   nystatin cream (MYCOSTATIN) Apply 1 application topically 2 (two) times daily.   ONETOUCH VERIO test strip USE UP TO 4 TIMES DAILY AS DIRECTED   pravastatin (PRAVACHOL) 40 MG tablet Take 40 mg by mouth daily.   Semaglutide (RYBELSUS) 3 MG TABS Take 3 mg by mouth daily.   valACYclovir (VALTREX) 1000 MG tablet TAKE 1 TABLET (1,000 MG TOTAL) BY MOUTH AS NEEDED (COLD SORES).    PHQ 2/9 Scores 12/20/2021 09/16/2021 08/29/2021 04/28/2021  PHQ - 2 Score 0 0 0  1  PHQ- 9 Score 4 3 4 5     GAD 7 : Generalized Anxiety Score 12/20/2021 09/16/2021 08/29/2021 04/28/2021  Nervous, Anxious, on Edge 0 0 0 2  Control/stop worrying 0 0 0 2  Worry too much - different things 0 0 0 2  Trouble relaxing 0 0 0 2  Restless 0 0 1 0  Easily annoyed or irritable 0 0 0 1  Afraid - awful might happen 0 0 0 1  Total GAD 7 Score 0 0 1 10  Anxiety Difficulty Not difficult at all Not difficult at all Not difficult at all Somewhat difficult    BP Readings from Last 3 Encounters:  12/20/21 124/70  10/15/21 (!) 153/83  09/16/21 138/80    Physical Exam Vitals and nursing note reviewed.  Constitutional:      General: She is not in acute distress.    Appearance: She is well-developed.  HENT:     Head: Normocephalic and atraumatic.     Right Ear: Tympanic membrane and ear canal normal.     Left Ear: Tympanic membrane and ear canal normal.     Nose:     Right Sinus: No maxillary sinus tenderness.     Left Sinus: No maxillary sinus tenderness.  Eyes:     General: No scleral icterus.       Right eye: No discharge.        Left eye: No discharge.     Conjunctiva/sclera: Conjunctivae normal.  Neck:     Thyroid: No thyromegaly.     Vascular: No carotid bruit.  Cardiovascular:     Rate and Rhythm: Normal rate and regular rhythm.     Pulses: Normal pulses.     Heart sounds: Normal heart sounds.  Pulmonary:     Effort: Pulmonary effort is normal. No respiratory distress.     Breath sounds: No wheezing.  Chest:  Breasts:    Right: No mass, nipple discharge, skin change or tenderness.     Left: No mass, nipple discharge, skin change or tenderness.  Abdominal:     General: Bowel sounds are normal.     Palpations: Abdomen is soft.     Tenderness: There is no abdominal tenderness.  Musculoskeletal:     Cervical back: Normal range of motion. No erythema.     Right lower leg: No edema.     Left lower leg: No edema.  Lymphadenopathy:     Cervical: No cervical  adenopathy.  Skin:    General: Skin is warm and dry.     Findings: No rash.  Neurological:     Mental Status: She is alert and oriented to person, place, and time.     Cranial Nerves: No cranial nerve deficit.  Sensory: No sensory deficit.     Deep Tendon Reflexes: Reflexes are normal and symmetric.  Psychiatric:        Attention and Perception: Attention normal.        Mood and Affect: Mood normal.    Wt Readings from Last 3 Encounters:  12/20/21 (!) 306 lb (138.8 kg)  10/15/21 (!) 302 lb 14.6 oz (137.4 kg)  09/16/21 (!) 303 lb (137.4 kg)    BP 124/70   Pulse 64   Ht 5\' 6"  (1.676 m)   Wt (!) 306 lb (138.8 kg)   SpO2 96%   BMI 49.39 kg/m   Assessment and Plan: 1. Annual physical exam Exam is normal except for weight. Encourage regular exercise and appropriate dietary changes. Up to date on screenings and immunizations. - POCT urinalysis dipstick  2. Encounter for screening mammogram for breast cancer Schedule at [facility] - MM 3D SCREEN BREAST BILATERAL  3. Type II diabetes mellitus with complication (HCC) Clinically stable by exam and report without s/s of hypoglycemia.  May be higher due to issues getting medications covered. DM complicated by hypertension and dyslipidemia. Tolerating medications well without side effects or other concerns. Consider Mounjaro in place of Rybelsus. - CBC with Differential/Platelet - Comprehensive metabolic panel - Hemoglobin A1c - TSH - Microalbumin / creatinine urine ratio  4. Hyperlipidemia associated with type 2 diabetes mellitus (HCC) Tolerating statin medication without side effects at this time LDL is at goal of < 70 on current dose Continue same therapy without change at this time. - Comprehensive metabolic panel - Lipid panel  5. Depression with anxiety Doing well on Lexapro and Strattera - followed by Psych  6. Avitaminosis D - VITAMIN D 25 Hydroxy (Vit-D Deficiency, Fractures)  7. BMI 50.0-59.9, adult  (HCC) Continue weight loss, exercise  8. Colon cancer screening Due for 5 yr colonoscopy - Ambulatory referral to Gastroenterology   Partially dictated using Dragon software. Any errors are unintentional.  Bari Edward, MD [facility]  12/20/2021

## 2021-12-21 ENCOUNTER — Other Ambulatory Visit: Payer: Self-pay | Admitting: Internal Medicine

## 2021-12-21 DIAGNOSIS — E118 Type 2 diabetes mellitus with unspecified complications: Secondary | ICD-10-CM

## 2021-12-21 LAB — COMPREHENSIVE METABOLIC PANEL
ALT: 68 IU/L — ABNORMAL HIGH (ref 0–32)
AST: 54 IU/L — ABNORMAL HIGH (ref 0–40)
Albumin/Globulin Ratio: 1.8 (ref 1.2–2.2)
Albumin: 4.5 g/dL (ref 3.8–4.9)
Alkaline Phosphatase: 81 IU/L (ref 44–121)
BUN/Creatinine Ratio: 16 (ref 9–23)
BUN: 14 mg/dL (ref 6–24)
Bilirubin Total: 0.5 mg/dL (ref 0.0–1.2)
CO2: 22 mmol/L (ref 20–29)
Calcium: 9.3 mg/dL (ref 8.7–10.2)
Chloride: 98 mmol/L (ref 96–106)
Creatinine, Ser: 0.86 mg/dL (ref 0.57–1.00)
Globulin, Total: 2.5 g/dL (ref 1.5–4.5)
Glucose: 198 mg/dL — ABNORMAL HIGH (ref 70–99)
Potassium: 4.1 mmol/L (ref 3.5–5.2)
Sodium: 137 mmol/L (ref 134–144)
Total Protein: 7 g/dL (ref 6.0–8.5)
eGFR: 79 mL/min/{1.73_m2} (ref >59)

## 2021-12-21 LAB — HEMOGLOBIN A1C
Est. average glucose Bld gHb Est-mCnc: 229 mg/dL
Hgb A1c MFr Bld: 9.6 % — ABNORMAL HIGH (ref 4.8–5.6)

## 2021-12-21 LAB — CBC WITH DIFFERENTIAL/PLATELET
Basophils Absolute: 0 10*3/uL (ref 0.0–0.2)
Basos: 1 %
EOS (ABSOLUTE): 0.3 10*3/uL (ref 0.0–0.4)
Eos: 4 %
Hematocrit: 46.2 % (ref 34.0–46.6)
Hemoglobin: 15.2 g/dL (ref 11.1–15.9)
Immature Grans (Abs): 0 10*3/uL (ref 0.0–0.1)
Immature Granulocytes: 0 %
Lymphocytes Absolute: 2.3 10*3/uL (ref 0.7–3.1)
Lymphs: 28 %
MCH: 30.9 pg (ref 26.6–33.0)
MCHC: 32.9 g/dL (ref 31.5–35.7)
MCV: 94 fL (ref 79–97)
Monocytes Absolute: 0.6 10*3/uL (ref 0.1–0.9)
Monocytes: 7 %
Neutrophils Absolute: 5 10*3/uL (ref 1.4–7.0)
Neutrophils: 60 %
Platelets: 279 10*3/uL (ref 150–450)
RBC: 4.92 x10E6/uL (ref 3.77–5.28)
RDW: 12.7 % (ref 11.7–15.4)
WBC: 8.2 10*3/uL (ref 3.4–10.8)

## 2021-12-21 LAB — LIPID PANEL
Chol/HDL Ratio: 3.9 ratio (ref 0.0–4.4)
Cholesterol, Total: 200 mg/dL — ABNORMAL HIGH (ref 100–199)
HDL: 51 mg/dL (ref >39)
LDL Chol Calc (NIH): 115 mg/dL — ABNORMAL HIGH (ref 0–99)
Triglycerides: 195 mg/dL — ABNORMAL HIGH (ref 0–149)
VLDL Cholesterol Cal: 34 mg/dL (ref 5–40)

## 2021-12-21 LAB — TSH: TSH: 2.97 u[IU]/mL (ref 0.450–4.500)

## 2021-12-21 LAB — MICROALBUMIN / CREATININE URINE RATIO
Creatinine, Urine: 104.6 mg/dL
Microalb/Creat Ratio: 28 mg/g creat (ref 0–29)
Microalbumin, Urine: 29.8 ug/mL

## 2021-12-21 LAB — VITAMIN D 25 HYDROXY (VIT D DEFICIENCY, FRACTURES): Vit D, 25-Hydroxy: 41 ng/mL (ref 30.0–100.0)

## 2021-12-21 MED ORDER — TIRZEPATIDE 2.5 MG/0.5ML ~~LOC~~ SOAJ
2.5000 mg | SUBCUTANEOUS | 0 refills | Status: DC
Start: 2021-12-21 — End: 2022-01-16

## 2021-12-22 ENCOUNTER — Telehealth: Payer: Self-pay

## 2021-12-22 ENCOUNTER — Encounter: Payer: Self-pay | Admitting: Internal Medicine

## 2021-12-22 NOTE — Telephone Encounter (Signed)
Completed PA on [email or web address] for Mounjaro 2.5. ? ? (Key: [id number]) ? ?Awaiting outcome - patient informed. ?

## 2021-12-23 NOTE — Telephone Encounter (Signed)
Request Reference Number: [id number]. MOUNJARO INJ 2.5/0.5 is approved through 12/23/2022.  ?

## 2021-12-26 ENCOUNTER — Other Ambulatory Visit: Payer: Self-pay | Admitting: Internal Medicine

## 2021-12-26 DIAGNOSIS — B009 Herpesviral infection, unspecified: Secondary | ICD-10-CM

## 2021-12-27 NOTE — Telephone Encounter (Signed)
Requested Prescriptions  ?Pending Prescriptions Disp Refills  ?? acyclovir ointment (ZOVIRAX) 5 % [Pharmacy Med Name: ACYCLOVIR 5% OINTMENT] 15 g 2  ?  Sig: APPLY 1 APPLICATION TOPICALLY DAILY AS NEEDED (COLD SORES).  ?  ? Antimicrobials:  Antiviral Agents - Anti-Herpetic Passed - 12/26/2021 10:14 AM  ?  ?  Passed - Valid encounter within last 12 months  ?  Recent Outpatient Visits   ?      ? 1 week ago Annual physical exam  ? [facility] Glean Hess, MD  ? 3 months ago Viral URI with cough  ? [facility] Glean Hess, MD  ? 4 months ago Type II diabetes mellitus with complication [facility])  ? [facility] Glean Hess, MD  ? 8 months ago Type II diabetes mellitus with complication [facility])  ? [facility] Glean Hess, MD  ? 1 year ago Annual physical exam  ? [facility] Glean Hess, MD  ?  ?  ?Future Appointments   ?        ? In 4 months Army Melia Jesse Sans, MD [facility], [place]  ?  ? ?  ?  ?  ? ? ?

## 2021-12-28 ENCOUNTER — Telehealth: Payer: Self-pay

## 2021-12-28 NOTE — Telephone Encounter (Signed)
Called pt and left VM asking if she has had her eye exam yet this year. Waiting for pt to return call with info. ? ?CRM created.  ?

## 2022-01-11 ENCOUNTER — Encounter: Payer: Self-pay | Admitting: Internal Medicine

## 2022-01-16 ENCOUNTER — Other Ambulatory Visit: Payer: Self-pay | Admitting: Internal Medicine

## 2022-01-16 DIAGNOSIS — E118 Type 2 diabetes mellitus with unspecified complications: Secondary | ICD-10-CM

## 2022-01-16 MED ORDER — TIRZEPATIDE 5 MG/0.5ML ~~LOC~~ SOAJ: 5.0000 mg | SUBCUTANEOUS | 0 refills | Status: DC

## 2022-02-10 ENCOUNTER — Other Ambulatory Visit: Payer: Self-pay | Admitting: Internal Medicine

## 2022-02-10 DIAGNOSIS — E118 Type 2 diabetes mellitus with unspecified complications: Secondary | ICD-10-CM

## 2022-02-10 MED ORDER — TIRZEPATIDE 7.5 MG/0.5ML ~~LOC~~ SOAJ: 7.5000 mg | SUBCUTANEOUS | 0 refills | Status: DC

## 2022-02-25 ENCOUNTER — Other Ambulatory Visit: Payer: Self-pay | Admitting: Internal Medicine

## 2022-02-25 DIAGNOSIS — E1169 Type 2 diabetes mellitus with other specified complication: Secondary | ICD-10-CM

## 2022-02-27 NOTE — Telephone Encounter (Signed)
Requested Prescriptions  Pending Prescriptions Disp Refills  . fenofibrate (TRICOR) 145 MG tablet [Pharmacy Med Name: FENOFIBRATE 145 MG TABLET] 90 tablet 1    Sig: TAKE 1 TABLET BY MOUTH EVERY DAY     Cardiovascular:  Antilipid - Fibric Acid Derivatives Failed - 02/25/2022  9:16 AM      Failed - ALT in normal range and within 360 days    ALT  Date Value Ref Range Status  12/20/2021 68 (H) 0 - 32 IU/L Final         Failed - AST in normal range and within 360 days    AST  Date Value Ref Range Status  12/20/2021 54 (H) 0 - 40 IU/L Final         Failed - Lipid Panel in normal range within the last 12 months    Cholesterol, Total  Date Value Ref Range Status  12/20/2021 200 (H) 100 - 199 mg/dL Final   LDL Chol Calc (NIH)  Date Value Ref Range Status  12/20/2021 115 (H) 0 - 99 mg/dL Final   HDL  Date Value Ref Range Status  12/20/2021 51 >39 mg/dL Final   Triglycerides  Date Value Ref Range Status  12/20/2021 195 (H) 0 - 149 mg/dL Final         Passed - Cr in normal range and within 360 days    Creatinine, Ser  Date Value Ref Range Status  12/20/2021 0.86 0.57 - 1.00 mg/dL Final         Passed - HGB in normal range and within 360 days    Hemoglobin  Date Value Ref Range Status  12/20/2021 15.2 11.1 - 15.9 g/dL Final         Passed - HCT in normal range and within 360 days    Hematocrit  Date Value Ref Range Status  12/20/2021 46.2 34.0 - 46.6 % Final         Passed - PLT in normal range and within 360 days    Platelets  Date Value Ref Range Status  12/20/2021 279 150 - 450 x10E3/uL Final         Passed - WBC in normal range and within 360 days    WBC  Date Value Ref Range Status  12/20/2021 8.2 3.4 - 10.8 x10E3/uL Final  08/08/2017 8.8 3.6 - 11.0 K/uL Final         Passed - eGFR is 30 or above and within 360 days    GFR calc Af Amer  Date Value Ref Range Status  09/29/2020 90 >59 mL/min/1.73 Final    Comment:    **In accordance with recommendations  from the NKF-ASN Task force,**   [facility] is in the process of updating its eGFR calculation to the   2021 CKD-EPI creatinine equation that estimates kidney function   without a race variable.    GFR calc non Af Amer  Date Value Ref Range Status  09/29/2020 78 >59 mL/min/1.73 Final   eGFR  Date Value Ref Range Status  12/20/2021 79 >59 mL/min/1.73 Final         Passed - Valid encounter within last 12 months    Recent Outpatient Visits          2 months ago Annual physical exam   [facility] Glean Hess, MD   5 months ago Viral URI with cough   [facility] Glean Hess, MD   6 months ago Type II diabetes mellitus with  complication [facility])   [facility] Glean Hess, MD   10 months ago Type II diabetes mellitus with complication [facility])   [facility] Glean Hess, MD   1 year ago Annual physical exam   [facility] Glean Hess, MD      Future Appointments            In 1 month Army Melia, Jesse Sans, MD [facility], [facility]

## 2022-03-07 ENCOUNTER — Other Ambulatory Visit: Payer: Self-pay | Admitting: Internal Medicine

## 2022-03-08 NOTE — Telephone Encounter (Signed)
Requested by interface surescripts. Medication expired 12/27/21.  Requested Prescriptions  Refused Prescriptions Disp Refills  . fluconazole (DIFLUCAN) 100 MG tablet [Pharmacy Med Name: FLUCONAZOLE 100 MG TABLET] 7 tablet 0    Sig: TAKE 1 TABLET BY MOUTH DAILY FOR 7 DAYS     Off-Protocol Failed - 03/07/2022  9:59 AM      Failed - Medication not assigned to a protocol, review manually.      Passed - Valid encounter within last 12 months    Recent Outpatient Visits          2 months ago Annual physical exam   [facility] Glean Hess, MD   5 months ago Viral URI with cough   [facility] Glean Hess, MD   6 months ago Type II diabetes mellitus with complication [facility])   [facility] Glean Hess, MD   10 months ago Type II diabetes mellitus with complication [facility])   [facility] Glean Hess, MD   1 year ago Annual physical exam   [facility] Glean Hess, MD      Future Appointments            In 1 month Army Melia Jesse Sans, MD [facility], [facility]

## 2022-03-20 LAB — HM DIABETES EYE EXAM

## 2022-04-24 ENCOUNTER — Other Ambulatory Visit: Payer: Self-pay | Admitting: Internal Medicine

## 2022-04-24 DIAGNOSIS — E118 Type 2 diabetes mellitus with unspecified complications: Secondary | ICD-10-CM

## 2022-04-25 NOTE — Telephone Encounter (Signed)
Requested Prescriptions  Pending Prescriptions Disp Refills  . MOUNJARO 7.5 MG/0.5ML Pen [Pharmacy Med Name: MOUNJARO 7.5 MG/0.5 ML PEN]      Sig: INJECT 7.5 MG SUBCUTANEOUSLY WEEKLY     Off-Protocol Failed - 04/24/2022  9:33 AM      Failed - Medication not assigned to a protocol, review manually.      Passed - Valid encounter within last 12 months    Recent Outpatient Visits          4 months ago Annual physical exam   [facility] Glean Hess, MD   7 months ago Viral URI with cough   [facility] Glean Hess, MD   7 months ago Type II diabetes mellitus with complication [facility])   [facility] Glean Hess, MD   12 months ago Type II diabetes mellitus with complication [facility])   [facility] Glean Hess, MD   1 year ago Annual physical exam   [facility] Glean Hess, MD      Future Appointments            In 2 days Glean Hess, MD [facility]

## 2022-04-27 ENCOUNTER — Ambulatory Visit (INDEPENDENT_AMBULATORY_CARE_PROVIDER_SITE_OTHER): Payer: 59 | Admitting: Internal Medicine

## 2022-04-27 ENCOUNTER — Encounter: Payer: Self-pay | Admitting: Internal Medicine

## 2022-04-27 VITALS — BP 132/80 | HR 71 | Ht 66.0 in | Wt 281.0 lb

## 2022-04-27 DIAGNOSIS — R7989 Other specified abnormal findings of blood chemistry: Secondary | ICD-10-CM

## 2022-04-27 DIAGNOSIS — E118 Type 2 diabetes mellitus with unspecified complications: Secondary | ICD-10-CM

## 2022-04-27 MED ORDER — EPINEPHRINE 0.3 MG/0.3ML IJ SOAJ: 0.3000 mg | INTRAMUSCULAR | 1 refills | Status: AC | PRN

## 2022-04-27 MED ORDER — FREESTYLE LIBRE 14 DAY SENSOR MISC
1.0000 | 1 refills | Status: AC
Start: 2022-04-27 — End: ?

## 2022-04-27 NOTE — Progress Notes (Signed)
Date:  04/27/2022   Name:  Mary Macias   DOB:  03-18-1966   MRN:  [id number]   Chief Complaint: Hypertension and Diabetes  Diabetes She presents for her follow-up diabetic visit. She has type 2 diabetes mellitus. Her disease course has been improving. Pertinent negatives for hypoglycemia include no headaches or tremors. Pertinent negatives for diabetes include no chest pain, no fatigue, no polydipsia and no polyuria. Symptoms are stable. Current diabetic treatments: farxiga, Mounjaro. Her home blood glucose trend is decreasing steadily. An ACE inhibitor/angiotensin II receptor blocker is being taken.  Hyperlipidemia This is a chronic problem. Recent lipid tests were reviewed and are high. Pertinent negatives include no chest pain or shortness of breath. Current antihyperlipidemic treatment includes statins and fibric acid derivatives (pravachol 40). The current treatment provides moderate improvement of lipids.   She is tolerating the [facility] fairly well. Has belching and gas and bloating for the first day or so after dosing.  Currently on 7.5 mg. Has lost 25 lbs over the past 4-5 months.  Lab Results  Component Value Date   NA 137 12/20/2021   K 4.1 12/20/2021   CO2 22 12/20/2021   GLUCOSE 198 (H) 12/20/2021   BUN 14 12/20/2021   CREATININE 0.86 12/20/2021   CALCIUM 9.3 12/20/2021   EGFR 79 12/20/2021   GFRNONAA 78 09/29/2020   Lab Results  Component Value Date   CHOL 200 (H) 12/20/2021   HDL 51 12/20/2021   LDLCALC 115 (H) 12/20/2021   TRIG 195 (H) 12/20/2021   CHOLHDL 3.9 12/20/2021   Lab Results  Component Value Date   TSH 2.970 12/20/2021   Lab Results  Component Value Date   HGBA1C 9.6 (H) 12/20/2021   Lab Results  Component Value Date   WBC 8.2 12/20/2021   HGB 15.2 12/20/2021   HCT 46.2 12/20/2021   MCV 94 12/20/2021   PLT 279 12/20/2021   Lab Results  Component Value Date   ALT 68 (H) 12/20/2021   AST 54 (H) 12/20/2021   ALKPHOS 81 12/20/2021    BILITOT 0.5 12/20/2021   Lab Results  Component Value Date   VD25OH 41.0 12/20/2021     Review of Systems  Constitutional:  Negative for appetite change, fatigue, fever and unexpected weight change.  HENT:  Negative for tinnitus and trouble swallowing.   Eyes:  Negative for visual disturbance.  Respiratory:  Negative for cough, chest tightness and shortness of breath.   Cardiovascular:  Negative for chest pain, palpitations and leg swelling.  Gastrointestinal:  Negative for abdominal pain.  Endocrine: Negative for polydipsia and polyuria.  Genitourinary:  Negative for dysuria and hematuria.  Musculoskeletal:  Negative for arthralgias.  Neurological:  Negative for tremors, numbness and headaches.  Psychiatric/Behavioral:  Negative for dysphoric mood.     Patient Active Problem List   Diagnosis Date Noted   Acute recurrent maxillary sinusitis 10/15/2021   Adult ADHD 04/28/2021   Hx of total hysterectomy 12/15/2020   Nontraumatic complete tear of right rotator cuff 12/15/2020   OSA (obstructive sleep apnea) 02/02/2020   Elevated liver function tests 11/22/2019   Depression with anxiety 08/07/2019   Peripheral edema 02/08/2018   BMI 50.0-59.9, adult ([place]) 01/25/2018   Colon polyp, hyperplastic 07/16/2017   Type II diabetes mellitus with complication ([place]) 63/10/6008   Hyperlipidemia associated with type 2 diabetes mellitus ([place]) 07/08/2015   Environmental and seasonal allergies 07/08/2015   Avitaminosis D 07/08/2015   Beat, premature ventricular 02/11/2015  Allergies  Allergen Reactions   Peanut-Containing Drug Products Hives and Swelling    Uses EPI-PEN As Needed for this.   Atorvastatin Other (See Comments)    Severe body aches   Meloxicam Other (See Comments)    Kidney function reduced    Povidone Iodine Rash   Cephalexin Rash   Metformin And Related Diarrhea   Trulicity [Dulaglutide]     Abdominal discomfort, flatus    Past Surgical History:  Procedure  Laterality Date   CHOLECYSTECTOMY     COLONOSCOPY WITH PROPOFOL N/A 07/13/2017   Procedure: COLONOSCOPY WITH PROPOFOL;  Surgeon: Lollie Sails, MD;  Location: [facility];  Service: Endoscopy;  Laterality: N/A;   NASAL SINUS SURGERY     OOPHORECTOMY     SHOULDER ARTHROSCOPY WITH ROTATOR CUFF REPAIR AND SUBACROMIAL DECOMPRESSION Right 11/22/2020   Procedure: Right shoulder arthroscopic rotator cuff repair, subacromial decompression, and biceps tenodesis ,distal clavicle excision;  Surgeon: Leim Fabry, MD;  Location: [facility];  Service: Orthopedics;  Laterality: Right;   TOTAL VAGINAL HYSTERECTOMY     TUBAL LIGATION      Social History   Tobacco Use   Smoking status: Never   Smokeless tobacco: Never  Vaping Use   Vaping Use: Never used  Substance Use Topics   Alcohol use: No    Alcohol/week: 0.0 standard drinks of alcohol   Drug use: No     Medication list has been reviewed and updated.  Current Meds  Medication Sig   acyclovir ointment (ZOVIRAX) 5 % APPLY 1 APPLICATION TOPICALLY DAILY AS NEEDED (COLD SORES).   atomoxetine (STRATTERA) 60 MG capsule Take 60 mg by mouth every morning.   Cholecalciferol (DIALYVITE VITAMIN D 5000) 125 MCG (5000 UT) capsule Take 5,000 Units by mouth 3 (three) times a week.   Continuous Blood Gluc Sensor (FREESTYLE LIBRE 14 DAY SENSOR) MISC 1 each by Does not apply route every 14 (fourteen) days.   escitalopram (LEXAPRO) 10 MG tablet TAKE 1 TABLET BY MOUTH EVERY DAY (Patient taking differently: Take 10 mg by mouth daily.)   famotidine-calcium carbonate-magnesium hydroxide (PEPCID COMPLETE) 10-800-165 MG chewable tablet Chew 1 tablet by mouth daily as needed (indigestion).   FARXIGA 10 MG TABS tablet TAKE 1 TABLET BY MOUTH EVERY DAY BEFORE BREAKFAST   fenofibrate (TRICOR) 145 MG tablet TAKE 1 TABLET BY MOUTH EVERY DAY   fexofenadine (ALLEGRA) 180 MG tablet Take by mouth.   MOUNJARO 7.5 MG/0.5ML Pen INJECT 7.5 MG SUBCUTANEOUSLY WEEKLY   NON  FORMULARY at bedtime. Auto Cpap   nystatin cream (MYCOSTATIN) Apply 1 application topically 2 (two) times daily.   pravastatin (PRAVACHOL) 40 MG tablet Take 40 mg by mouth daily.   valACYclovir (VALTREX) 1000 MG tablet TAKE 1 TABLET (1,000 MG TOTAL) BY MOUTH AS NEEDED (COLD SORES).   [DISCONTINUED] EPINEPHrine 0.3 mg/0.3 mL IJ SOAJ injection Inject 0.3 mLs (0.3 mg total) into the muscle as needed for anaphylaxis.       04/27/2022   10:35 AM 12/20/2021    8:22 AM 09/16/2021    9:09 AM 08/29/2021    9:16 AM  GAD 7 : Generalized Anxiety Score  Nervous, Anxious, on Edge 1 0 0 0  Control/stop worrying 2 0 0 0  Worry too much - different things 2 0 0 0  Trouble relaxing 1 0 0 0  Restless 1 0 0 1  Easily annoyed or irritable 2 0 0 0  Afraid - awful might happen 0 0 0 0  Total GAD 7  Score 9 0 0 1  Anxiety Difficulty Very difficult Not difficult at all Not difficult at all Not difficult at all       04/27/2022   10:35 AM 12/20/2021    8:22 AM 09/16/2021    9:09 AM  Depression screen PHQ 2/9  Decreased Interest 1 0 0  Down, Depressed, Hopeless 1 0 0  PHQ - 2 Score 2 0 0  Altered sleeping 1 1 1   Tired, decreased energy 1 1 1   Change in appetite 2 1 1   Feeling bad or failure about yourself  0 0 0  Trouble concentrating 2 1 0  Moving slowly or fidgety/restless 0 0 0  Suicidal thoughts 0 0 0  PHQ-9 Score 8 4 3   Difficult doing work/chores Very difficult Not difficult at all Not difficult at all    BP Readings from Last 3 Encounters:  04/27/22 132/80  12/20/21 124/70  10/15/21 (!) 153/83    Physical Exam Vitals and nursing note reviewed.  Constitutional:      General: She is not in acute distress.    Appearance: She is well-developed.  HENT:     Head: Normocephalic and atraumatic.  Cardiovascular:     Rate and Rhythm: Normal rate and regular rhythm.     Pulses: Normal pulses.     Heart sounds: No murmur heard. Pulmonary:     Effort: Pulmonary effort is normal. No respiratory  distress.     Breath sounds: No wheezing or rhonchi.  Musculoskeletal:     Cervical back: Normal range of motion.     Right lower leg: No edema.     Left lower leg: No edema.  Skin:    General: Skin is warm and dry.     Capillary Refill: Capillary refill takes less than 2 seconds.     Findings: No rash.  Neurological:     General: No focal deficit present.     Mental Status: She is alert and oriented to person, place, and time.  Psychiatric:        Mood and Affect: Mood normal.        Behavior: Behavior normal.     Wt Readings from Last 3 Encounters:  04/27/22 281 lb (127.5 kg)  12/20/21 (!) 306 lb (138.8 kg)  10/15/21 (!) 302 lb 14.6 oz (137.4 kg)    BP 132/80   Pulse 71   Ht 5' 6"  (1.676 m)   Wt 281 lb (127.5 kg)   SpO2 95%   BMI 45.35 kg/m   Assessment and Plan: 1. Type II diabetes mellitus with complication (HCC) Clinically stable by exam and report without s/s of hypoglycemia. DM complicated by hypertension and dyslipidemia. Tolerating medications with minimal side effects. Recommend continuing [place] for CV and Renal benefits - Comprehensive metabolic panel - Hemoglobin A1c - Continuous Blood Gluc Sensor (FREESTYLE LIBRE 14 DAY SENSOR) MISC; 1 each by Does not apply route every 14 (fourteen) days.  Dispense: 6 each; Refill: 1  2. Elevated liver function tests Continue to monitor Mother recently died from nonalcoholic cirrhosis - cause unknown possibly Tylenol over use Would recommend Hepatic Elastography - Comprehensive metabolic panel    Partially dictated using Dragon software. Any errors are unintentional.  Halina Maidens, MD [facility]  04/27/2022

## 2022-04-28 ENCOUNTER — Encounter: Payer: Self-pay | Admitting: Internal Medicine

## 2022-04-28 LAB — COMPREHENSIVE METABOLIC PANEL
ALT: 21 IU/L (ref 0–32)
AST: 23 IU/L (ref 0–40)
Albumin/Globulin Ratio: 1.5 (ref 1.2–2.2)
Albumin: 4.3 g/dL (ref 3.8–4.9)
Alkaline Phosphatase: 71 IU/L (ref 44–121)
BUN/Creatinine Ratio: 10 (ref 9–23)
BUN: 11 mg/dL (ref 6–24)
Bilirubin Total: 0.4 mg/dL (ref 0.0–1.2)
CO2: 25 mmol/L (ref 20–29)
Calcium: 9.3 mg/dL (ref 8.7–10.2)
Chloride: 99 mmol/L (ref 96–106)
Creatinine, Ser: 1.07 mg/dL — ABNORMAL HIGH (ref 0.57–1.00)
Globulin, Total: 2.8 g/dL (ref 1.5–4.5)
Glucose: 85 mg/dL (ref 70–99)
Potassium: 4.1 mmol/L (ref 3.5–5.2)
Sodium: 138 mmol/L (ref 134–144)
Total Protein: 7.1 g/dL (ref 6.0–8.5)
eGFR: 61 mL/min/{1.73_m2} (ref >59)

## 2022-04-28 LAB — HEMOGLOBIN A1C
Est. average glucose Bld gHb Est-mCnc: 128 mg/dL
Hgb A1c MFr Bld: 6.1 % — ABNORMAL HIGH (ref 4.8–5.6)

## 2022-05-16 ENCOUNTER — Encounter: Payer: Self-pay | Admitting: Internal Medicine

## 2022-05-21 ENCOUNTER — Other Ambulatory Visit: Payer: Self-pay | Admitting: Internal Medicine

## 2022-05-21 DIAGNOSIS — E118 Type 2 diabetes mellitus with unspecified complications: Secondary | ICD-10-CM

## 2022-05-22 NOTE — Telephone Encounter (Signed)
Requested Prescriptions  Pending Prescriptions Disp Refills  . FARXIGA 10 MG TABS tablet [Pharmacy Med Name: FARXIGA 10 MG TABLET] 30 tablet 2    Sig: TAKE 1 TABLET BY MOUTH EVERY DAY BEFORE BREAKFAST     Endocrinology:  Diabetes - SGLT2 Inhibitors Failed - 05/21/2022  9:35 AM      Failed - Cr in normal range and within 360 days    Creatinine, Ser  Date Value Ref Range Status  04/27/2022 1.07 (H) 0.57 - 1.00 mg/dL Final         Passed - HBA1C is between 0 and 7.9 and within 180 days    Hgb A1c MFr Bld  Date Value Ref Range Status  04/27/2022 6.1 (H) 4.8 - 5.6 % Final    Comment:             Prediabetes: 5.7 - 6.4          Diabetes: >6.4          Glycemic control for adults with diabetes: <7.0          Passed - eGFR in normal range and within 360 days    GFR calc Af Amer  Date Value Ref Range Status  09/29/2020 90 >59 mL/min/1.73 Final    Comment:    **In accordance with recommendations from the NKF-ASN Task force,**   [facility] is in the process of updating its eGFR calculation to the   2021 CKD-EPI creatinine equation that estimates kidney function   without a race variable.    GFR calc non Af Amer  Date Value Ref Range Status  09/29/2020 78 >59 mL/min/1.73 Final   eGFR  Date Value Ref Range Status  04/27/2022 61 >59 mL/min/1.73 Final         Passed - Valid encounter within last 6 months    Recent Outpatient Visits          3 weeks ago Type II diabetes mellitus with complication [facility])   [facility] Glean Hess, MD   5 months ago Annual physical exam   [facility] Glean Hess, MD   8 months ago Viral URI with cough   [facility] Glean Hess, MD   8 months ago Type II diabetes mellitus with complication [facility])   [facility] Glean Hess, MD   1 year ago Type II diabetes mellitus with complication [facility])   [facility] Glean Hess, MD      Future Appointments            In 3 months  Army Melia Jesse Sans, MD [facility], [facility]

## 2022-06-29 ENCOUNTER — Ambulatory Visit
Admission: RE | Admit: 2022-06-29 | Discharge: 2022-06-29 | Disposition: A | Discharge status: Home / Self Care | Payer: No Typology Code available for payment source | Source: Ambulatory Visit | Attending: Internal Medicine | Admitting: Internal Medicine

## 2022-06-29 DIAGNOSIS — Z1231 Encounter for screening mammogram for malignant neoplasm of breast: Secondary | ICD-10-CM | POA: Insufficient documentation

## 2022-07-10 ENCOUNTER — Ambulatory Visit
Admission: EM | Admit: 2022-07-10 | Discharge: 2022-07-10 | Disposition: A | Discharge status: Home / Self Care | Payer: No Typology Code available for payment source | Attending: Emergency Medicine | Admitting: Emergency Medicine

## 2022-07-10 ENCOUNTER — Encounter: Payer: Self-pay | Admitting: Emergency Medicine

## 2022-07-10 DIAGNOSIS — L0231 Cutaneous abscess of buttock: Secondary | ICD-10-CM

## 2022-07-10 DIAGNOSIS — L03317 Cellulitis of buttock: Secondary | ICD-10-CM

## 2022-07-10 MED ORDER — DOXYCYCLINE HYCLATE 100 MG PO CAPS: 100.0000 mg | ORAL_CAPSULE | Freq: Two times a day (BID) | ORAL | 0 refills | Status: DC

## 2022-07-10 NOTE — ED Triage Notes (Signed)
Pt has abscess on her buttocks. Started about 2 weeks ago. She states it is drainage but still feels like it is getting bigger.

## 2022-07-10 NOTE — ED Provider Notes (Signed)
[facility]    CSN: [id number] Arrival date & time: 07/10/22  0802      History   Chief Complaint Chief Complaint  Patient presents with   Abscess    HPI Mary Macias is a 56 y.o. female.   HPI  12-year female here for evaluation of skin complaint.  Patient reports that she went to the beach over Labor Day and when she returned she developed a abscess on her left buttock.  She states that every time she goes to the beach she develops an abscess.  The abscess been in place for approximately 3 weeks and she states that last night it did drain some bloody drainage.  She denies any fever, nausea, or vomiting.  She does endorse some fatigue.  Past Medical History:  Diagnosis Date   Allergy    Complication of anesthesia    slow to wake up   Diabetes mellitus without complication ([place])    Pneumonia    PONV (postoperative nausea and vomiting)    Sleep apnea    cpap   Stable angina pectoris 08/10/2017    Patient Active Problem List   Diagnosis Date Noted   Acute recurrent maxillary sinusitis 10/15/2021   Adult ADHD 04/28/2021   Hx of total hysterectomy 12/15/2020   Nontraumatic complete tear of right rotator cuff 12/15/2020   OSA (obstructive sleep apnea) 02/02/2020   Elevated liver function tests 11/22/2019   Depression with anxiety 08/07/2019   Peripheral edema 02/08/2018   BMI 50.0-59.9, adult ([place]) 01/25/2018   Colon polyp, hyperplastic 07/16/2017   Type II diabetes mellitus with complication ([place]) 29/93/7169   Hyperlipidemia associated with type 2 diabetes mellitus ([place]) 07/08/2015   Environmental and seasonal allergies 07/08/2015   Avitaminosis D 07/08/2015   Beat, premature ventricular 02/11/2015    Past Surgical History:  Procedure Laterality Date   CHOLECYSTECTOMY     COLONOSCOPY WITH PROPOFOL N/A 07/13/2017   Procedure: COLONOSCOPY WITH PROPOFOL;  Surgeon: Lollie Sails, MD;  Location: [facility];  Service: Endoscopy;   Laterality: N/A;   dental implants Right    NASAL SINUS SURGERY     OOPHORECTOMY     SHOULDER ARTHROSCOPY WITH ROTATOR CUFF REPAIR AND SUBACROMIAL DECOMPRESSION Right 11/22/2020   Procedure: Right shoulder arthroscopic rotator cuff repair, subacromial decompression, and biceps tenodesis ,distal clavicle excision;  Surgeon: Leim Fabry, MD;  Location: [facility] ORS;  Service: Orthopedics;  Laterality: Right;   TOTAL VAGINAL HYSTERECTOMY     TUBAL LIGATION      OB History   No obstetric history on file.      Home Medications    Prior to Admission medications   Medication Sig Start Date End Date Taking? Authorizing Provider  atomoxetine (STRATTERA) 60 MG capsule Take 60 mg by mouth every morning. 11/26/21  Yes [provider]  Cholecalciferol (DIALYVITE VITAMIN D 5000) 125 MCG (5000 UT) capsule Take 5,000 Units by mouth 3 (three) times a week.   Yes [provider]  doxycycline (VIBRAMYCIN) 100 MG capsule Take 1 capsule (100 mg total) by mouth 2 (two) times daily. 07/10/22  Yes Margarette Canada, NP  famotidine-calcium carbonate-magnesium hydroxide (PEPCID COMPLETE) 10-800-165 MG chewable tablet Chew 1 tablet by mouth daily as needed (indigestion).   Yes [provider]  FARXIGA 10 MG TABS tablet TAKE 1 TABLET BY MOUTH EVERY DAY BEFORE BREAKFAST 05/22/22  Yes Glean Hess, MD  fenofibrate (TRICOR) 145 MG tablet TAKE 1 TABLET BY MOUTH EVERY DAY 02/27/22  Yes Army Melia,  Jesse Sans, MD  fexofenadine (ALLEGRA) 180 MG tablet Take by mouth.   Yes [provider]  MOUNJARO 7.5 MG/0.5ML Pen INJECT 7.5 MG SUBCUTANEOUSLY WEEKLY 04/25/22  Yes Glean Hess, MD  NON FORMULARY at bedtime. Auto Cpap   Yes [provider]  pravastatin (PRAVACHOL) 40 MG tablet Take 40 mg by mouth daily.   Yes [provider]  valACYclovir (VALTREX) 1000 MG tablet TAKE 1 TABLET (1,000 MG TOTAL) BY MOUTH AS NEEDED (COLD SORES). 10/25/21  Yes Glean Hess, MD  acyclovir  ointment (ZOVIRAX) 5 % APPLY 1 APPLICATION TOPICALLY DAILY AS NEEDED (COLD SORES). 12/27/21   Glean Hess, MD  Continuous Blood Gluc Sensor (FREESTYLE LIBRE 14 DAY SENSOR) MISC 1 each by Does not apply route every 14 (fourteen) days. 04/27/22   Glean Hess, MD  EPINEPHrine 0.3 mg/0.3 mL IJ SOAJ injection Inject 0.3 mg into the muscle as needed for anaphylaxis. 04/27/22   Glean Hess, MD  escitalopram (LEXAPRO) 10 MG tablet TAKE 1 TABLET BY MOUTH EVERY DAY Patient taking differently: Take 10 mg by mouth daily. 07/17/21   Glean Hess, MD  nystatin cream (MYCOSTATIN) Apply 1 application topically 2 (two) times daily. 04/28/21   Glean Hess, MD    Family History Family History  Problem Relation Age of Onset   Breast cancer Paternal Grandmother    Colon cancer Mother    Hypertension Mother    Diabetes Father    Hypertension Father    CAD Father    Hypertension Sister     Social History Social History   Tobacco Use   Smoking status: Never   Smokeless tobacco: Never  Vaping Use   Vaping Use: Never used  Substance Use Topics   Alcohol use: No    Alcohol/week: 0.0 standard drinks of alcohol   Drug use: No     Allergies   Peanut-containing drug products, Atorvastatin, Meloxicam, Povidone iodine, Cephalexin, Metformin and related, and Trulicity [dulaglutide]   Review of Systems Review of Systems  Constitutional:  Positive for fatigue. Negative for fever.  Gastrointestinal:  Negative for nausea and vomiting.  Skin:        Painful swollen area on left buttock  Hematological: Negative.   Psychiatric/Behavioral: Negative.       Physical Exam Triage Vital Signs ED Triage Vitals  Enc Vitals Group     BP 07/10/22 0819 134/82     Pulse Rate 07/10/22 0819 81     Resp 07/10/22 0819 16     Temp 07/10/22 0819 98.4 F (36.9 C)     Temp Source 07/10/22 0819 Oral     SpO2 07/10/22 0819 99 %     Weight 07/10/22 0817 281 lb 1.4 oz (127.5 kg)     Height  07/10/22 0817 '5\' 6"'$  (1.676 m)     Head Circumference --      Peak Flow --      Pain Score 07/10/22 0816 3     Pain Loc --      Pain Edu? --      Excl. in [place]? --    No data found.  Updated Vital Signs BP 134/82 (BP Location: Right Arm)   Pulse 81   Temp 98.4 F (36.9 C) (Oral)   Resp 16   Ht '5\' 6"'$  (1.676 m)   Wt 281 lb 1.4 oz (127.5 kg)   SpO2 99%   BMI 45.37 kg/m   Visual Acuity Right Eye Distance:  Left Eye Distance:   Bilateral Distance:    Right Eye Near:   Left Eye Near:    Bilateral Near:     Physical Exam Vitals and nursing note reviewed. Exam conducted with a chaperone present Illene Silver, CMA).  Constitutional:      Appearance: Normal appearance. She is not ill-appearing.  HENT:     Head: Normocephalic and atraumatic.  Skin:    General: Skin is warm and dry.     Capillary Refill: Capillary refill takes less than 2 seconds.     Findings: Erythema and lesion present.  Neurological:     General: No focal deficit present.     Mental Status: She is alert and oriented to person, place, and time.  Psychiatric:        Mood and Affect: Mood normal.        Behavior: Behavior normal.        Thought Content: Thought content normal.        Judgment: Judgment normal.      UC Treatments / Results  Labs (all labs ordered are listed, but only abnormal results are displayed) Labs Reviewed - No data to display  EKG   Radiology No results found.  Procedures Procedures (including critical care time)  Medications Ordered in UC Medications - No data to display  Initial Impression / Assessment and Plan / UC Course  I have reviewed the triage vital signs and the nursing notes.  Pertinent labs & imaging results that were available during my care of the patient were reviewed by me and considered in my medical decision making (see chart for details).   Patient is a pleasant, nontoxic-appearing 56 year old female here for evaluation of a painful swollen area on  the inner aspect of her left buttock that has been in place for the past 3 weeks.  She reports that it is draining a bloody drainage.  She denies any fever, nausea, or vomiting.  She has had some fatigue.  She states that this is a common occurrence after she goes to the beach, which she did over Labor Day weekend.  With [place], CMA as a chaperone I had the patient undress waist down and evaluated the abscess on her buttock.  There is a large area of erythema which is approximately the size of a silver dollar.  There is a smaller area of deep red where it looks like the abscess ruptured with underlining induration but no fluctuance.  The indurated area is approximately size of a quarter.  At this point there is nothing to drain and I will encourage the patient to apply warm compresses to the area to see if she can get the remainder the infection and indurated tissue to express to the opening.  She has an allergy to Keflex so I will start her on doxycycline twice daily for 10 days for treatment of the immature abscess and cellulitis.  Return precautions reviewed.   Final Clinical Impressions(s) / UC Diagnoses   Final diagnoses:  Abscess of left buttock  Cellulitis of buttock     Discharge Instructions      Take the Doxycycline twice daily with food for 10 days.  Doxycycline will make you more sensitive to sunburn so wear sunscreen when outdoors and reapply it every 90 minutes.  Apply warm compresses to help promote drainage.  Use OTC Tylenol and Ibuprofen according to the package instructions as needed for pain.  Return for new or worsening symptoms.  ED Prescriptions     Medication Sig Dispense Auth. Provider   doxycycline (VIBRAMYCIN) 100 MG capsule Take 1 capsule (100 mg total) by mouth 2 (two) times daily. 20 capsule Margarette Canada, NP      PDMP not reviewed this encounter.   Margarette Canada, NP 07/10/22 [phone]

## 2022-07-10 NOTE — Discharge Instructions (Addendum)
Take the Doxycycline twice daily with food for 10 days.  Doxycycline will make you more sensitive to sunburn so wear sunscreen when outdoors and reapply it every 90 minutes.  Apply warm compresses to help promote drainage.  Use OTC Tylenol and Ibuprofen according to the package instructions as needed for pain.  Return for new or worsening symptoms.   

## 2022-07-25 ENCOUNTER — Other Ambulatory Visit: Payer: Self-pay | Admitting: Internal Medicine

## 2022-07-25 DIAGNOSIS — E118 Type 2 diabetes mellitus with unspecified complications: Secondary | ICD-10-CM

## 2022-07-25 NOTE — Telephone Encounter (Signed)
Requested medication (s) are due for refill today:   Yes  Requested medication (s) are on the active medication list:   Yes  Future visit scheduled:   Yes   Last ordered: 04/25/2022 6 ml, 0 refills  Returned because there isn't a protocol assigned to this medication   Requested Prescriptions  Pending Prescriptions Disp Refills   MOUNJARO 7.5 MG/0.5ML Pen [Pharmacy Med Name: MOUNJARO 7.5 MG/0.5 ML PEN]      Sig: INJECT 7.5 MG SUBCUTANEOUSLY WEEKLY     Off-Protocol Failed - 07/25/2022  1:38 AM      Failed - Medication not assigned to a protocol, review manually.      Passed - Valid encounter within last 12 months    Recent Outpatient Visits           2 months ago Type II diabetes mellitus with complication [facility])   [facility], Jesse Sans, MD   7 months ago Annual physical exam   [facility], Jesse Sans, MD   10 months ago Viral URI with cough   [facility], Jesse Sans, MD   11 months ago Type II diabetes mellitus with complication [facility])   [facility] at [facility], Jesse Sans, MD   1 year ago Type II diabetes mellitus with complication [facility])   [facility], Jesse Sans, MD       Future Appointments             In 1 month Army Melia, Jesse Sans, MD [place], [facility]

## 2022-07-28 ENCOUNTER — Other Ambulatory Visit: Payer: Self-pay | Admitting: Internal Medicine

## 2022-07-28 ENCOUNTER — Encounter: Payer: Self-pay | Admitting: Internal Medicine

## 2022-07-28 DIAGNOSIS — E118 Type 2 diabetes mellitus with unspecified complications: Secondary | ICD-10-CM

## 2022-07-28 MED ORDER — TIRZEPATIDE 5 MG/0.5ML ~~LOC~~ SOAJ: 5.0000 mg | SUBCUTANEOUS | 1 refills | Status: DC

## 2022-07-28 NOTE — Telephone Encounter (Signed)
Please review.  KP

## 2022-08-05 LAB — TSH: TSH: 1.9 (ref 0.41–5.90)

## 2022-08-05 LAB — BASIC METABOLIC PANEL
BUN: 8 (ref 4–21)
CO2: 26 — AB (ref 13–22)
Chloride: 107 (ref 99–108)
Creatinine: 0.9 (ref 0.5–1.1)
Glucose: 91
Potassium: 4.1 mEq/L (ref 3.5–5.1)
Sodium: 139 (ref 137–147)

## 2022-08-05 LAB — HEPATIC FUNCTION PANEL
ALT: 15 U/L (ref 7–35)
AST: 24 (ref 13–35)
Alkaline Phosphatase: 52 (ref 25–125)
Bilirubin, Total: 0.5

## 2022-08-05 LAB — CBC AND DIFFERENTIAL
HCT: 45 (ref 36–46)
Hemoglobin: 15.3 (ref 12.0–16.0)
Platelets: 288 10*3/uL (ref 150–400)
WBC: 7.9

## 2022-08-07 ENCOUNTER — Encounter: Payer: Self-pay | Admitting: Internal Medicine

## 2022-08-08 ENCOUNTER — Telehealth: Payer: Self-pay

## 2022-08-08 NOTE — Telephone Encounter (Signed)
Transition Care Management Follow-up Telephone Call Date of discharge and from where: [facility] 08/05/2022 How have you been since you were released from the hospital? Feeling slightly better. Dehydrated. Drinking more fluids, and checking BS. Still has headache, and dizziness.   Any questions or concerns? Yes Patient will discuss at upcoming appt.  Items Reviewed: Did the pt receive and understand the discharge instructions provided? Yes  Medications obtained and verified? Yes  Other? No  Any new allergies since your discharge? No  Dietary orders reviewed? Yes Do you have support at home? Yes   Home Care and Equipment/Supplies: Were home health services ordered? no  Functional Questionnaire: (I = Independent and D = Dependent) ADLs: I  Bathing/Dressing- I  Meal Prep- I  Eating- I  Maintaining continence- I  Transferring/Ambulation- I  Managing Meds- I  Follow up appointments reviewed:  PCP Hospital f/u appt confirmed? Yes  Scheduled to see [facility] on 08/09/2022 @ 9:20AM. [facility] f/u appt confirmed? No   Are transportation arrangements needed? No  If their condition worsens, is the pt aware to call PCP or go to the Emergency Dept.? Yes Was the patient provided with contact information for the PCP's office or ED? Yes Was to pt encouraged to call back with questions or concerns? Yes

## 2022-08-09 ENCOUNTER — Ambulatory Visit (INDEPENDENT_AMBULATORY_CARE_PROVIDER_SITE_OTHER): Payer: 59 | Admitting: Internal Medicine

## 2022-08-09 ENCOUNTER — Encounter: Payer: Self-pay | Admitting: Internal Medicine

## 2022-08-09 VITALS — BP 136/78 | HR 86 | Temp 97.7°F | Ht 66.0 in | Wt 273.0 lb

## 2022-08-09 DIAGNOSIS — E118 Type 2 diabetes mellitus with unspecified complications: Secondary | ICD-10-CM

## 2022-08-09 DIAGNOSIS — R7989 Other specified abnormal findings of blood chemistry: Secondary | ICD-10-CM

## 2022-08-09 DIAGNOSIS — K5901 Slow transit constipation: Secondary | ICD-10-CM

## 2022-08-09 NOTE — Progress Notes (Signed)
Date:  08/09/2022   Name:  Mary Macias   DOB:  24-Apr-1966   MRN:  [id number]   Chief Complaint: Diabetes and Fatigue (X 1 month, Headache, body aches, sweats, no fever, constipation more than a month, neg covid) Seen in ED for low blood sugar.  All labs were normal.  She tested negative for RSV/flu/covid. She was given IVF and discharged home to follow up here.  She still feels fatigued but has been having these symptoms for at least a month.  The peri-rectal abscess from early October has healed with antibiotics.  Diabetes She presents for her follow-up diabetic visit. She has type 2 diabetes mellitus. Her disease course has been improving (blood sugars no longer low since holding mounjaro). Pertinent negatives for hypoglycemia include no nervousness/anxiousness. Associated symptoms include fatigue. Pertinent negatives for diabetes include no chest pain. Symptoms are improving. Current diabetic treatment includes oral agent (monotherapy) (farxiga; had been on mounjaro 7.5 for a months when symptoms started). Her weight is decreasing steadily. She is following a generally healthy diet. She monitors blood glucose at home 5+ x per day. Blood glucose monitoring compliance is excellent. Her home blood glucose trend is increasing steadily (no longer in the red zone).  Constipation This is a new problem. Episode onset: since increasing the dose of Strattera. The stool is described as pellet like. The patient is on a high fiber diet. She Does not exercise regularly. There has Been adequate water intake. Pertinent negatives include no fever. Treatments tried: Miralax. The treatment provided mild relief.    Lab Results  Component Value Date   NA 139 08/05/2022   K 4.1 08/05/2022   CO2 26 (A) 08/05/2022   GLUCOSE 85 04/27/2022   BUN 8 08/05/2022   CREATININE 0.9 08/05/2022   CALCIUM 9.3 04/27/2022   EGFR 61 04/27/2022   GFRNONAA 78 09/29/2020   Lab Results  Component Value Date   CHOL  200 (H) 12/20/2021   HDL 51 12/20/2021   LDLCALC 115 (H) 12/20/2021   TRIG 195 (H) 12/20/2021   CHOLHDL 3.9 12/20/2021   Lab Results  Component Value Date   TSH 1.90 08/05/2022   Lab Results  Component Value Date   HGBA1C 6.1 (H) 04/27/2022   Lab Results  Component Value Date   WBC 7.9 08/05/2022   HGB 15.3 08/05/2022   HCT 45 08/05/2022   MCV 94 12/20/2021   PLT 288 08/05/2022   Lab Results  Component Value Date   ALT 15 08/05/2022   AST 24 08/05/2022   ALKPHOS 52 08/05/2022   BILITOT 0.4 04/27/2022   Lab Results  Component Value Date   VD25OH 41.0 12/20/2021     Review of Systems  Constitutional:  Positive for fatigue. Negative for appetite change, chills, diaphoresis and fever.  HENT:  Negative for sore throat and trouble swallowing.   Respiratory:  Negative for chest tightness and shortness of breath.   Cardiovascular:  Negative for chest pain and palpitations.  Gastrointestinal:  Positive for constipation.  Musculoskeletal:  Negative for arthralgias and joint swelling.  Skin:  Negative for wound.  Psychiatric/Behavioral:  Negative for dysphoric mood and sleep disturbance. The patient is not nervous/anxious.     Patient Active Problem List   Diagnosis Date Noted   Acute recurrent maxillary sinusitis 10/15/2021   Adult ADHD 04/28/2021   Hx of total hysterectomy 12/15/2020   Nontraumatic complete tear of right rotator cuff 12/15/2020   OSA (obstructive sleep apnea) 02/02/2020  Elevated liver function tests 11/22/2019   Depression with anxiety 08/07/2019   Peripheral edema 02/08/2018   BMI 50.0-59.9, adult ([place]) 01/25/2018   Colon polyp, hyperplastic 07/16/2017   Type II diabetes mellitus with complication ([place]) 10/93/2355   Hyperlipidemia associated with type 2 diabetes mellitus ([place]) 07/08/2015   Environmental and seasonal allergies 07/08/2015   Avitaminosis D 07/08/2015   Beat, premature ventricular 02/11/2015    Allergies  Allergen Reactions    Peanut-Containing Drug Products Hives and Swelling    Uses EPI-PEN As Needed for this.   Povidone-Iodine Other (See Comments) and Rash    Other Reaction: Other reaction Other Reaction: Other reaction   Atorvastatin Other (See Comments)    Severe body aches   Meloxicam Other (See Comments)    Kidney function reduced  Kidney function reduced   Kidney function reduced   Povidone Iodine Rash   Iodine    Cephalexin Rash and Other (See Comments)   Metformin And Related Diarrhea   Trulicity [Dulaglutide]     Abdominal discomfort, flatus    Past Surgical History:  Procedure Laterality Date   CHOLECYSTECTOMY     COLONOSCOPY WITH PROPOFOL N/A 07/13/2017   Procedure: COLONOSCOPY WITH PROPOFOL;  Surgeon: Lollie Sails, MD;  Location: [facility] ENDOSCOPY;  Service: Endoscopy;  Laterality: N/A;   dental implants Right    NASAL SINUS SURGERY     OOPHORECTOMY     SHOULDER ARTHROSCOPY WITH ROTATOR CUFF REPAIR AND SUBACROMIAL DECOMPRESSION Right 11/22/2020   Procedure: Right shoulder arthroscopic rotator cuff repair, subacromial decompression, and biceps tenodesis ,distal clavicle excision;  Surgeon: Leim Fabry, MD;  Location: [facility] ORS;  Service: Orthopedics;  Laterality: Right;   TOTAL VAGINAL HYSTERECTOMY     TUBAL LIGATION      Social History   Tobacco Use   Smoking status: Never   Smokeless tobacco: Never  Vaping Use   Vaping Use: Never used  Substance Use Topics   Alcohol use: No    Alcohol/week: 0.0 standard drinks of alcohol   Drug use: No     Medication list has been reviewed and updated.  Current Meds  Medication Sig   acyclovir ointment (ZOVIRAX) 5 % APPLY 1 APPLICATION TOPICALLY DAILY AS NEEDED (COLD SORES).   atomoxetine (STRATTERA) 80 MG capsule Take 80 mg by mouth every morning.   Cholecalciferol (DIALYVITE VITAMIN D 5000) 125 MCG (5000 UT) capsule Take 5,000 Units by mouth 3 (three) times a week.   Continuous Blood Gluc Sensor (FREESTYLE LIBRE 14 DAY SENSOR) MISC  1 each by Does not apply route every 14 (fourteen) days.   EPINEPHrine 0.3 mg/0.3 mL IJ SOAJ injection Inject 0.3 mg into the muscle as needed for anaphylaxis.   escitalopram (LEXAPRO) 10 MG tablet TAKE 1 TABLET BY MOUTH EVERY DAY (Patient taking differently: Take 10 mg by mouth daily.)   famotidine-calcium carbonate-magnesium hydroxide (PEPCID COMPLETE) 10-800-165 MG chewable tablet Chew 1 tablet by mouth daily as needed (indigestion).   FARXIGA 10 MG TABS tablet TAKE 1 TABLET BY MOUTH EVERY DAY BEFORE BREAKFAST   fenofibrate (TRICOR) 145 MG tablet TAKE 1 TABLET BY MOUTH EVERY DAY   fexofenadine (ALLEGRA) 180 MG tablet Take by mouth.   NON FORMULARY at bedtime. Auto Cpap   nystatin cream (MYCOSTATIN) Apply 1 application topically 2 (two) times daily.   Polyethylene Glycol 3350 (MIRALAX PO) Take by mouth as needed.   pravastatin (PRAVACHOL) 40 MG tablet Take 40 mg by mouth daily.   Probiotic Product (PROBIOTIC PO) Take by mouth  daily.   valACYclovir (VALTREX) 1000 MG tablet TAKE 1 TABLET (1,000 MG TOTAL) BY MOUTH AS NEEDED (COLD SORES).   [DISCONTINUED] atomoxetine (STRATTERA) 60 MG capsule Take 60 mg by mouth every morning.   [DISCONTINUED] doxycycline (VIBRAMYCIN) 100 MG capsule Take 1 capsule (100 mg total) by mouth 2 (two) times daily.       08/09/2022    9:35 AM 04/27/2022   10:35 AM 12/20/2021    8:22 AM 09/16/2021    9:09 AM  GAD 7 : Generalized Anxiety Score  Nervous, Anxious, on Edge 1 1 0 0  Control/stop worrying 1 2 0 0  Worry too much - different things 1 2 0 0  Trouble relaxing 1 1 0 0  Restless 1 1 0 0  Easily annoyed or irritable 1 2 0 0  Afraid - awful might happen 1 0 0 0  Total GAD 7 Score 7 9 0 0  Anxiety Difficulty Somewhat difficult Very difficult Not difficult at all Not difficult at all       08/09/2022    9:35 AM 04/27/2022   10:35 AM 12/20/2021    8:22 AM  Depression screen PHQ 2/9  Decreased Interest 2 1 0  Down, Depressed, Hopeless 1 1 0  PHQ - 2 Score 3  2 0  Altered sleeping _0 Tired, decreased energy _1 Change in appetite _2 Feeling bad or failure about yourself  0 0 0  Trouble concentrating _3 Moving slowly or fidgety/restless 3 0 0  Suicidal thoughts 0 0 0  PHQ-9 Score _4 Difficult doing work/chores Very difficult Very difficult Not difficult at all    BP Readings from Last 3 Encounters:  08/09/22 136/78  07/10/22 134/82  04/27/22 132/80    Physical Exam Vitals and nursing note reviewed.  Constitutional:      General: She is not in acute distress.    Appearance: Normal appearance. She is well-developed.  HENT:     Head: Normocephalic and atraumatic.  Cardiovascular:     Rate and Rhythm: Normal rate and regular rhythm.     Pulses: Normal pulses.  Pulmonary:     Effort: Pulmonary effort is normal. No respiratory distress.     Breath sounds: No wheezing or rhonchi.  Musculoskeletal:     Cervical back: Normal range of motion.     Right lower leg: No edema.     Left lower leg: No edema.  Lymphadenopathy:     Cervical: No cervical adenopathy.  Skin:    General: Skin is warm and dry.     Capillary Refill: Capillary refill takes less than 2 seconds.     Findings: No rash.  Neurological:     General: No focal deficit present.     Mental Status: She is alert and oriented to person, place, and time.  Psychiatric:        Mood and Affect: Mood normal.        Behavior: Behavior normal.     Wt Readings from Last 3 Encounters:  08/09/22 273 lb (123.8 kg)  07/10/22 281 lb 1.4 oz (127.5 kg)  04/27/22 281 lb (127.5 kg)    BP 136/78 (BP Location: Left Arm)   Pulse 86   Temp 97.7 F (36.5 C) (Oral)   Ht _5  (1.676 m)   Wt 273 lb (123.8 kg)   SpO2 97%   BMI 44.06 kg/m   Assessment and Plan:  1. Type II diabetes mellitus with complication ([place]) Had several weeks of low BS readings which are just now starting to increase as the Mounjaro is metabolized. Recommend holding Mounjaro but continuing  [place] Push fluids Will re-evaluate in one month.  2. Constipation by delayed colonic transit Continue Miralax daily along with fluids  3. Elevated liver function tests With family hx of non alcoholic liver disease (mother) Recommend Elastography - [place] ELASTOGRAPHY LIVER   Partially dictated using Editor, commissioning. Any errors are unintentional.  Halina Maidens, MD [facility]  08/09/2022

## 2022-08-14 ENCOUNTER — Ambulatory Visit
Admission: RE | Admit: 2022-08-14 | Discharge: 2022-08-14 | Disposition: A | Discharge status: Home / Self Care | Payer: No Typology Code available for payment source | Source: Ambulatory Visit | Attending: Internal Medicine | Admitting: Internal Medicine

## 2022-08-14 DIAGNOSIS — R7989 Other specified abnormal findings of blood chemistry: Secondary | ICD-10-CM | POA: Insufficient documentation

## 2022-08-15 ENCOUNTER — Inpatient Hospital Stay: Payer: 59 | Admitting: Internal Medicine

## 2022-08-16 ENCOUNTER — Ambulatory Visit: Payer: No Typology Code available for payment source

## 2022-09-03 ENCOUNTER — Other Ambulatory Visit: Payer: Self-pay | Admitting: Internal Medicine

## 2022-09-03 DIAGNOSIS — E1169 Type 2 diabetes mellitus with other specified complication: Secondary | ICD-10-CM

## 2022-09-05 ENCOUNTER — Encounter: Payer: Self-pay | Admitting: Internal Medicine

## 2022-09-05 ENCOUNTER — Ambulatory Visit (INDEPENDENT_AMBULATORY_CARE_PROVIDER_SITE_OTHER): Payer: 59 | Admitting: Internal Medicine

## 2022-09-05 VITALS — BP 120/78 | HR 64 | Ht 66.0 in | Wt 278.0 lb

## 2022-09-05 DIAGNOSIS — K760 Fatty (change of) liver, not elsewhere classified: Secondary | ICD-10-CM | POA: Diagnosis not present

## 2022-09-05 DIAGNOSIS — Z23 Encounter for immunization: Secondary | ICD-10-CM

## 2022-09-05 DIAGNOSIS — E118 Type 2 diabetes mellitus with unspecified complications: Secondary | ICD-10-CM

## 2022-09-05 LAB — POCT GLYCOSYLATED HEMOGLOBIN (HGB A1C): Hemoglobin A1C: 5.8 % — AB (ref 4.0–5.6)

## 2022-09-05 NOTE — Progress Notes (Signed)
  Date:  09/05/2022   Name:  Mary Macias   DOB:  10/01/1966   MRN:  [id number]   Chief Complaint: Diabetes, Hypertension, and Medication Problem (Mounjaro- pt stated she had one injection in the past month. Chills, shakes, and body aches)  Diabetes She presents for her follow-up diabetic visit. She has type 2 diabetes mellitus. Her disease course has been improving. Hypoglycemia symptoms include nervousness/anxiousness and sleepiness. Pertinent negatives for hypoglycemia include no headaches or tremors. Pertinent negatives for diabetes include no chest pain, no fatigue, no polydipsia and no polyuria. Hypoglycemia complications include required assistance. Current diabetic treatments: farxiga and Mounjaro decreased to 5 mg.    Lab Results  Component Value Date   NA 139 08/05/2022   K 4.1 08/05/2022   CO2 26 (A) 08/05/2022   GLUCOSE 85 04/27/2022   BUN 8 08/05/2022   CREATININE 0.9 08/05/2022   CALCIUM 9.3 04/27/2022   EGFR 61 04/27/2022   GFRNONAA 78 09/29/2020   Lab Results  Component Value Date   CHOL 200 (H) 12/20/2021   HDL 51 12/20/2021   LDLCALC 115 (H) 12/20/2021   TRIG 195 (H) 12/20/2021   CHOLHDL 3.9 12/20/2021   Lab Results  Component Value Date   TSH 1.90 08/05/2022   Lab Results  Component Value Date   HGBA1C 6.1 (H) 04/27/2022   Lab Results  Component Value Date   WBC 7.9 08/05/2022   HGB 15.3 08/05/2022   HCT 45 08/05/2022   MCV 94 12/20/2021   PLT 288 08/05/2022   Lab Results  Component Value Date   ALT 15 08/05/2022   AST 24 08/05/2022   ALKPHOS 52 08/05/2022   BILITOT 0.4 04/27/2022   Lab Results  Component Value Date   VD25OH 41.0 12/20/2021     Review of Systems  Constitutional:  Negative for appetite change, fatigue, fever and unexpected weight change.  HENT:  Negative for tinnitus and trouble swallowing.   Eyes:  Negative for visual disturbance.  Respiratory:  Negative for cough, chest tightness and shortness of breath.    Cardiovascular:  Negative for chest pain, palpitations and leg swelling.  Gastrointestinal:  Negative for abdominal pain.  Endocrine: Negative for polydipsia and polyuria.  Genitourinary:  Negative for dysuria and hematuria.  Musculoskeletal:  Negative for arthralgias.  Neurological:  Negative for tremors, numbness and headaches.  Psychiatric/Behavioral:  Negative for dysphoric mood. The patient is nervous/anxious.     Patient Active Problem List   Diagnosis Date Noted   Acute recurrent maxillary sinusitis 10/15/2021   Adult ADHD 04/28/2021   Hx of total hysterectomy 12/15/2020   Nontraumatic complete tear of right rotator cuff 12/15/2020   OSA (obstructive sleep apnea) 02/02/2020   Hepatic steatosis 11/22/2019   Depression with anxiety 08/07/2019   Peripheral edema 02/08/2018   BMI 50.0-59.9, adult (HCC) 01/25/2018   Colon polyp, hyperplastic 07/16/2017   Type II diabetes mellitus with complication (HCC) 08/18/2015   Hyperlipidemia associated with type 2 diabetes mellitus (H[facility]) 07/08/2015   Environmental and seasonal allergies 07/08/2015   Avitaminosis D 07/08/2015   Beat, premature ventricular 02/11/2015    Allergies  Allergen Reactions   Peanut-Containing Drug Products Hives and Swelling    Uses EPI-PEN As Needed for this.   Povidone-Iodine Other (See Comments) and Rash    Other Reaction: Other reaction Other Reaction: Other reaction   Atorvastatin Other (See Comments)    Severe body aches   Meloxicam Other (See Comments)    Kidney function reduced    Kidney function reduced   Kidney function reduced   Povidone Iodine Rash   Propoxyphene Rash    Uncoded Allergy. Allergen: Citrus fruits   Iodine    Cephalexin Rash and Other (See Comments)   Metformin And Related Diarrhea   Trulicity [Dulaglutide]     Abdominal discomfort, flatus    Past Surgical History:  Procedure Laterality Date   CHOLECYSTECTOMY     COLONOSCOPY WITH PROPOFOL N/A 07/13/2017   Procedure:  COLONOSCOPY WITH PROPOFOL;  Surgeon: Lollie Sails, MD;  Location: [facility] ENDOSCOPY;  Service: Endoscopy;  Laterality: N/A;   dental implants Right    NASAL SINUS SURGERY     OOPHORECTOMY     SHOULDER ARTHROSCOPY WITH ROTATOR CUFF REPAIR AND SUBACROMIAL DECOMPRESSION Right 11/22/2020   Procedure: Right shoulder arthroscopic rotator cuff repair, subacromial decompression, and biceps tenodesis ,distal clavicle excision;  Surgeon: Leim Fabry, MD;  Location: [facility] ORS;  Service: Orthopedics;  Laterality: Right;   TOTAL VAGINAL HYSTERECTOMY     TUBAL LIGATION      Social History   Tobacco Use   Smoking status: Never   Smokeless tobacco: Never  Vaping Use   Vaping Use: Never used  Substance Use Topics   Alcohol use: No    Alcohol/week: 0.0 standard drinks of alcohol   Drug use: No     Medication list has been reviewed and updated.  Current Meds  Medication Sig   acyclovir ointment (ZOVIRAX) 5 % APPLY 1 APPLICATION TOPICALLY DAILY AS NEEDED (COLD SORES).   atomoxetine (STRATTERA) 60 MG capsule Take 60 mg by mouth every morning.   b complex vitamins capsule Take 1 capsule by mouth daily.   BIOTIN PO Take by mouth.   Cholecalciferol (DIALYVITE VITAMIN D 5000) 125 MCG (5000 UT) capsule Take 5,000 Units by mouth 3 (three) times a week.   Continuous Blood Gluc Sensor (FREESTYLE LIBRE 14 DAY SENSOR) MISC 1 each by Does not apply route every 14 (fourteen) days.   EPINEPHrine 0.3 mg/0.3 mL IJ SOAJ injection Inject 0.3 mg into the muscle as needed for anaphylaxis.   escitalopram (LEXAPRO) 10 MG tablet TAKE 1 TABLET BY MOUTH EVERY DAY (Patient taking differently: Take 10 mg by mouth daily.)   famotidine-calcium carbonate-magnesium hydroxide (PEPCID COMPLETE) 10-800-165 MG chewable tablet Chew 1 tablet by mouth daily as needed (indigestion).   FARXIGA 10 MG TABS tablet TAKE 1 TABLET BY MOUTH EVERY DAY BEFORE BREAKFAST   fenofibrate (TRICOR) 145 MG tablet TAKE 1 TABLET BY MOUTH EVERY DAY    fexofenadine (ALLEGRA) 180 MG tablet Take by mouth.   MAGNESIUM PO Take by mouth daily.   NON FORMULARY at bedtime. Auto Cpap   nystatin cream (MYCOSTATIN) Apply 1 application topically 2 (two) times daily.   Polyethylene Glycol 3350 (MIRALAX PO) Take by mouth as needed.   pravastatin (PRAVACHOL) 40 MG tablet Take 40 mg by mouth daily.   Probiotic Product (PROBIOTIC PO) Take by mouth daily.   valACYclovir (VALTREX) 1000 MG tablet TAKE 1 TABLET (1,000 MG TOTAL) BY MOUTH AS NEEDED (COLD SORES).   [DISCONTINUED] atomoxetine (STRATTERA) 80 MG capsule Take 80 mg by mouth every morning.       08/09/2022    9:35 AM 04/27/2022   10:35 AM 12/20/2021    8:22 AM 09/16/2021    9:09 AM  GAD 7 : Generalized Anxiety Score  Nervous, Anxious, on Edge 1 1 0 0  Control/stop worrying 1 2 0 0  Worry too much - different things 1 2 0  0  Trouble relaxing 1 1 0 0  Restless 1 1 0 0  Easily annoyed or irritable 1 2 0 0  Afraid - awful might happen 1 0 0 0  Total GAD 7 Score 7 9 0 0  Anxiety Difficulty Somewhat difficult Very difficult Not difficult at all Not difficult at all       08/09/2022    9:35 AM 04/27/2022   10:35 AM 12/20/2021    8:22 AM  Depression screen PHQ 2/9  Decreased Interest 2 1 0  Down, Depressed, Hopeless 1 1 0  PHQ - 2 Score 3 2 0  Altered sleeping 1 1 1  Tired, decreased energy 3 1 1  Change in appetite 2 2 1  Feeling bad or failure about yourself  0 0 0  Trouble concentrating 3 2 1  Moving slowly or fidgety/restless 3 0 0  Suicidal thoughts 0 0 0  PHQ-9 Score 15 8 4  Difficult doing work/chores Very difficult Very difficult Not difficult at all    BP Readings from Last 3 Encounters:  09/05/22 120/78  08/09/22 136/78  07/10/22 134/82    Physical Exam Vitals and nursing note reviewed.  Constitutional:      General: She is not in acute distress.    Appearance: She is well-developed.  HENT:     Head: Normocephalic and atraumatic.  Cardiovascular:     Rate and Rhythm:  Normal rate and regular rhythm.     Heart sounds: No murmur heard. Pulmonary:     Effort: Pulmonary effort is normal. No respiratory distress.     Breath sounds: No wheezing or rhonchi.  Musculoskeletal:     Right lower leg: No edema.     Left lower leg: No edema.  Lymphadenopathy:     Cervical: No cervical adenopathy.  Skin:    General: Skin is warm and dry.     Capillary Refill: Capillary refill takes less than 2 seconds.     Findings: No rash.  Neurological:     Mental Status: She is alert and oriented to person, place, and time.  Psychiatric:        Mood and Affect: Mood normal.        Behavior: Behavior normal.     Wt Readings from Last 3 Encounters:  09/05/22 278 lb (126.1 kg)  08/09/22 273 lb (123.8 kg)  07/10/22 281 lb 1.4 oz (127.5 kg)    BP 120/78   Pulse 64   Ht 5' 6" (1.676 m)   Wt 278 lb (126.1 kg)   SpO2 98%   BMI 44.87 kg/m   Assessment and Plan: 1. Type II diabetes mellitus with complication (HCC) Clinically stable by exam and report without s/s of hypoglycemia. DM complicated by hypertension and dyslipidemia. Severe reaction to Mounjaro with flu like symptoms - will continue Farxiga alone with diet - POCT glycosylated hemoglobin (Hb A1C) = 5.8 down from 6.1  2. Hepatic steatosis Continue low fat diet Recent imaging reassuring.   Partially dictated using Dragon software. Any errors are unintentional.  Laura Berglund, MD [facility]  09/05/2022      

## 2022-09-05 NOTE — Telephone Encounter (Signed)
Requested Prescriptions  Pending Prescriptions Disp Refills   fenofibrate (TRICOR) 145 MG tablet [Pharmacy Med Name: FENOFIBRATE 145 MG TABLET] 90 tablet 1    Sig: TAKE 1 TABLET BY MOUTH EVERY DAY     Cardiovascular:  Antilipid - Fibric Acid Derivatives Failed - 09/03/2022  9:07 AM      Failed - Lipid Panel in normal range within the last 12 months    Cholesterol, Total  Date Value Ref Range Status  12/20/2021 200 (H) 100 - 199 mg/dL Final   LDL Chol Calc (NIH)  Date Value Ref Range Status  12/20/2021 115 (H) 0 - 99 mg/dL Final   HDL  Date Value Ref Range Status  12/20/2021 51 >39 mg/dL Final   Triglycerides  Date Value Ref Range Status  12/20/2021 195 (H) 0 - 149 mg/dL Final         Passed - ALT in normal range and within 360 days    ALT  Date Value Ref Range Status  08/05/2022 15 7 - 35 U/L Final         Passed - AST in normal range and within 360 days    AST  Date Value Ref Range Status  08/05/2022 24 13 - 35 Final         Passed - Cr in normal range and within 360 days    Creatinine  Date Value Ref Range Status  08/05/2022 0.9 0.5 - 1.1 Final   Creatinine, Ser  Date Value Ref Range Status  04/27/2022 1.07 (H) 0.57 - 1.00 mg/dL Final         Passed - HGB in normal range and within 360 days    Hemoglobin  Date Value Ref Range Status  08/05/2022 15.3 12.0 - 16.0 Final  12/20/2021 15.2 11.1 - 15.9 g/dL Final         Passed - HCT in normal range and within 360 days    HCT  Date Value Ref Range Status  08/05/2022 45 36 - 46 Final   Hematocrit  Date Value Ref Range Status  12/20/2021 46.2 34.0 - 46.6 % Final         Passed - PLT in normal range and within 360 days    Platelets  Date Value Ref Range Status  08/05/2022 288 150 - 400 K/uL Final  12/20/2021 279 150 - 450 x10E3/uL Final         Passed - WBC in normal range and within 360 days    WBC  Date Value Ref Range Status  08/05/2022 7.9  Final  08/08/2017 8.8 3.6 - 11.0 K/uL Final          Passed - eGFR is 30 or above and within 360 days    GFR calc Af Amer  Date Value Ref Range Status  09/29/2020 90 >59 mL/min/1.73 Final    Comment:    **In accordance with recommendations from the NKF-ASN Task force,**   [facility] is in the process of updating its eGFR calculation to the   2021 CKD-EPI creatinine equation that estimates kidney function   without a race variable.    GFR calc non Af Amer  Date Value Ref Range Status  09/29/2020 78 >59 mL/min/1.73 Final   eGFR  Date Value Ref Range Status  04/27/2022 61 >59 mL/min/1.73 Final         Passed - Valid encounter within last 12 months    Recent Outpatient Visits  Today Type II diabetes mellitus with complication [facility])   [place] [facility] at [facility], Jesse Sans, MD   3 weeks ago Type II diabetes mellitus with complication [facility])   [place] [facility] at [facility], Jesse Sans, MD   4 months ago Type II diabetes mellitus with complication [facility])   [place] [facility] at [facility], Jesse Sans, MD   8 months ago Annual physical exam   [facility] at [facility], Jesse Sans, MD   11 months ago Viral URI with cough   [place] [facility] at [facility], Jesse Sans, MD       Future Appointments             In 4 months Army Melia, Jesse Sans, MD [place] [facility] at [facility], [facility]

## 2022-09-13 ENCOUNTER — Other Ambulatory Visit: Payer: Self-pay | Admitting: Internal Medicine

## 2022-09-13 DIAGNOSIS — E118 Type 2 diabetes mellitus with unspecified complications: Secondary | ICD-10-CM

## 2022-09-13 NOTE — Telephone Encounter (Signed)
Requested Prescriptions  Pending Prescriptions Disp Refills   FARXIGA 10 MG TABS tablet [Pharmacy Med Name: FARXIGA 10 MG TABLET] 30 tablet 2    Sig: TAKE 1 TABLET BY MOUTH EVERY DAY BEFORE BREAKFAST     Endocrinology:  Diabetes - SGLT2 Inhibitors Passed - 09/13/2022  1:45 AM      Passed - Cr in normal range and within 360 days    Creatinine  Date Value Ref Range Status  08/05/2022 0.9 0.5 - 1.1 Final   Creatinine, Ser  Date Value Ref Range Status  04/27/2022 1.07 (H) 0.57 - 1.00 mg/dL Final         Passed - HBA1C is between 0 and 7.9 and within 180 days    Hemoglobin A1C  Date Value Ref Range Status  09/05/2022 5.8 (A) 4.0 - 5.6 % Final   Hgb A1c MFr Bld  Date Value Ref Range Status  04/27/2022 6.1 (H) 4.8 - 5.6 % Final    Comment:             Prediabetes: 5.7 - 6.4          Diabetes: >6.4          Glycemic control for adults with diabetes: <7.0          Passed - eGFR in normal range and within 360 days    GFR calc Af Amer  Date Value Ref Range Status  09/29/2020 90 >59 mL/min/1.73 Final    Comment:    **In accordance with recommendations from the NKF-ASN Task force,**   [facility] is in the process of updating its eGFR calculation to the   2021 CKD-EPI creatinine equation that estimates kidney function   without a race variable.    GFR calc non Af Amer  Date Value Ref Range Status  09/29/2020 78 >59 mL/min/1.73 Final   eGFR  Date Value Ref Range Status  04/27/2022 61 >59 mL/min/1.73 Final         Passed - Valid encounter within last 6 months    Recent Outpatient Visits           1 week ago Type II diabetes mellitus with complication ([place])   [place] [facility] at [facility], Jesse Sans, MD   1 month ago Type II diabetes mellitus with complication [facility])   [place] [facility] at [facility], Jesse Sans, MD   4 months ago Type II diabetes mellitus with complication [facility])   [place]  Primary Care and Sports Medicine at [facility], Jesse Sans, MD   8 months ago Annual physical exam   [facility] at [facility], Jesse Sans, MD   12 months ago Viral URI with cough   [place] [facility] at [facility], Jesse Sans, MD       Future Appointments             In 3 months Army Melia, Jesse Sans, MD [place] [facility] at [facility], [facility]

## 2022-10-09 ENCOUNTER — Encounter: Payer: Self-pay | Admitting: Emergency Medicine

## 2022-10-09 ENCOUNTER — Emergency Department
Admission: EM | Admit: 2022-10-09 | Discharge: 2022-10-09 | Disposition: A | Discharge status: Home / Self Care | Payer: No Typology Code available for payment source | Attending: Emergency Medicine | Admitting: Emergency Medicine

## 2022-10-09 ENCOUNTER — Emergency Department: Payer: No Typology Code available for payment source

## 2022-10-09 DIAGNOSIS — S0990XA Unspecified injury of head, initial encounter: Secondary | ICD-10-CM | POA: Diagnosis present

## 2022-10-09 DIAGNOSIS — Y9389 Activity, other specified: Secondary | ICD-10-CM | POA: Diagnosis not present

## 2022-10-09 DIAGNOSIS — W108XXA Fall (on) (from) other stairs and steps, initial encounter: Secondary | ICD-10-CM | POA: Diagnosis not present

## 2022-10-09 DIAGNOSIS — Y998 Other external cause status: Secondary | ICD-10-CM | POA: Insufficient documentation

## 2022-10-09 DIAGNOSIS — M7918 Myalgia, other site: Secondary | ICD-10-CM

## 2022-10-09 DIAGNOSIS — Y92009 Unspecified place in unspecified non-institutional (private) residence as the place of occurrence of the external cause: Secondary | ICD-10-CM | POA: Diagnosis not present

## 2022-10-09 DIAGNOSIS — W19XXXA Unspecified fall, initial encounter: Secondary | ICD-10-CM

## 2022-10-09 MED ORDER — CYCLOBENZAPRINE HCL 5 MG PO TABS: 5.0000 mg | ORAL_TABLET | Freq: Three times a day (TID) | ORAL | 0 refills | Status: AC | PRN

## 2022-10-09 MED ORDER — CYCLOBENZAPRINE HCL 10 MG PO TABS
10.0000 mg | ORAL_TABLET | Freq: Once | ORAL | Status: AC
  Administered 2022-10-09: 10 mg via ORAL
  Filled 2022-10-09: qty 1

## 2022-10-09 NOTE — ED Triage Notes (Signed)
Pt in via EMS from home with c/o fall. Pt missed the last step and landed on her butt. Pt hit the back of her head on the brick wall. No LOC, no blood thinners. Pt c/o pain to area that hit, right hip pain and left knee pain

## 2022-10-09 NOTE — ED Triage Notes (Signed)
Pt arrived via [facility] from home where she slipped on rug and fell backwards striking her posterior head on brick wall and landing. Pt c/o right hip, left knee and posterior head pain. Pt has abrasion noted to the posterior head with swelling as well as left hand abrasion. Pt denies LOC but sts she is having light sensitivity post fall. Pt denies blood thinner medication.

## 2022-10-09 NOTE — ED Provider Notes (Signed)
[facility] Emergency Department Provider Note     Event Date/Time   First MD Initiated Contact with Patient 10/09/22 2128     (approximate)   History   Fall   HPI  Mary Macias is a 57 y.o. female presents to the ED via EMS from home, where patient had a mechanical fall.  She missed a step in her carport, landing on her buttocks and hitting the back of her head.  She denies any LOC, nausea, vomiting, or dizziness.  She denies any blood thinner use.  She does report some pain to the right hip and the left knee.  She presents to the ED for evaluation with no other complaints at this time, other than some mild light sensitivity and a mild headache.   Physical Exam   Triage Vital Signs: ED Triage Vitals [10/09/22 1912]  Enc Vitals Group     BP (!) 159/85     Pulse Rate 79     Resp 16     Temp 98.9 F (37.2 C)     Temp Source Oral     SpO2 97 %     Weight 290 lb (131.5 kg)     Height '5\' 9"'$  (1.753 m)     Head Circumference      Peak Flow      Pain Score      Pain Loc      Pain Edu?      Excl. in [place]?     Most recent vital signs: Vitals:   10/09/22 1912  BP: (!) 159/85  Pulse: 79  Resp: 16  Temp: 98.9 F (37.2 C)  SpO2: 97%    General Awake, no distress. NAD CV:  Good peripheral perfusion.  RESP:  Normal effort.  ABD:  No distention.  MSK:  Normal active range of motion of all extremities. NEURO: Cranial nerves II to XII grossly intact.     ED Results / Procedures / Treatments   Labs (all labs ordered are listed, but only abnormal results are displayed) Labs Reviewed - No data to display   EKG   RADIOLOGY  I personally viewed and evaluated these images as part of my medical decision making, as well as reviewing the written report by the radiologist.  ED Provider Interpretation: no acute findings  CT Cervical Spine Wo Contrast  Result Date: 10/09/2022 CLINICAL DATA:  Neck trauma, midline tenderness (Age 23-64y)  EXAM: CT CERVICAL SPINE WITHOUT CONTRAST TECHNIQUE: Multidetector CT imaging of the cervical spine was performed without intravenous contrast. Multiplanar CT image reconstructions were also generated. RADIATION DOSE REDUCTION: This exam was performed according to the departmental dose-optimization program which includes automated exposure control, adjustment of the mA and/or kV according to patient size and/or use of iterative reconstruction technique. COMPARISON:  None Available. FINDINGS: Alignment: Normal. Skull base and vertebrae: No acute fracture. No primary bone lesion or focal pathologic process. Soft tissues and spinal canal: No prevertebral fluid or swelling. No visible canal hematoma. Disc levels: Disc space narrowing with marginal osteophytes C3-4 through C5-6. 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 Upper chest: Negative. IMPRESSION: Degenerative changes.  No acute traumatic abnormalities. Electronically Signed   By: Sammie Bench M.D.   On: 10/09/2022 19:49   CT Head Wo Contrast  Result Date: 10/09/2022 CLINICAL DATA:  Head trauma EXAM: CT HEAD WITHOUT CONTRAST TECHNIQUE: Contiguous axial images were obtained from the base of the skull through the vertex without intravenous contrast. RADIATION DOSE REDUCTION: This exam was  performed according to the departmental dose-optimization program which includes automated exposure control, adjustment of the mA and/or kV according to patient size and/or use of iterative reconstruction technique. COMPARISON:  None Available. FINDINGS: Brain: No evidence of acute infarction, hemorrhage, hydrocephalus, extra-axial collection or mass lesion/mass effect. Vascular: No hyperdense vessel or unexpected calcification. Skull: Normal. Negative for fracture or focal lesion. Sinuses/Orbits: Sinuses clear.  Orbital contents unremarkable. Other: None. IMPRESSION: Unremarkable examination. Electronically Signed   By: Sammie Bench M.D.   On: 10/09/2022 19:47   DG Knee Complete 4  Views Left  Result Date: 10/09/2022 CLINICAL DATA:  Fall with knee pain EXAM: LEFT KNEE - COMPLETE 4+ VIEW COMPARISON:  None Available. FINDINGS: No fracture or malalignment. 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 No sizeable effusion IMPRESSION: No acute osseous abnormality. Tricompartment arthritis. Electronically Signed   By: Donavan Foil M.D.   On: 10/09/2022 19:35   DG HIP UNILAT WITH PELVIS 2-3 VIEWS RIGHT  Result Date: 10/09/2022 CLINICAL DATA:  Fall with right-sided hip pain EXAM: DG HIP (WITH OR WITHOUT PELVIS) 2-3V RIGHT COMPARISON:  None Available. FINDINGS: There is no evidence of hip fracture or dislocation. There is no evidence of arthropathy or other focal bone abnormality. IMPRESSION: Negative. Electronically Signed   By: Donavan Foil M.D.   On: 10/09/2022 19:35     PROCEDURES:  Critical Care performed: No  Procedures   MEDICATIONS ORDERED IN ED: Medications  cyclobenzaprine (FLEXERIL) tablet 10 mg (10 mg Oral Given 10/09/22 2150)     IMPRESSION / MDM / ASSESSMENT AND PLAN / ED COURSE  I reviewed the triage vital signs and the nursing notes.                              Differential diagnosis includes, but is not limited to, epidural/subdural hemorrhage, concussion, skull fracture, hip fracture, hip dislocation, knee fracture, knee sprain  Patient's presentation is most consistent with acute complicated illness / injury requiring diagnostic workup.  Patient to the ED for evaluation of injury sustained following a mechanical fall.  Patient presents via EMS from home, after she slipped on a step, falling backwards hitting the back of her head patient denies any LOC.  She presents with some acute pain to the right hip and left knee.  CT and plain, evaluation does not reveal any acute close head injury or acute arthropathy, based on my interpretation.  Patient's diagnosis is consistent with minor closed head injury.  Patient will be discharged home with prescriptions for cyclobenzaprine. Patient is to follow up with primary provider as needed or otherwise directed. Patient is given ED precautions to return to the ED for any worsening or new symptoms.     FINAL CLINICAL IMPRESSION(S) / ED DIAGNOSES   Final diagnoses:  Fall, initial encounter  Minor head injury, initial encounter  Musculoskeletal pain     Rx / DC Orders   ED Discharge Orders          Ordered    cyclobenzaprine (FLEXERIL) 5 MG tablet  3 times daily PRN        10/09/22 2145             Note:  This document was prepared using Dragon voice recognition software and may include unintentional dictation errors.    Melvenia Needles, PA-C 10/09/22 2152    Harvest Dark, MD 10/09/22 2311

## 2022-10-09 NOTE — Discharge Instructions (Addendum)
Your exam, labs, CT scans all normal and reassuring at this time.  No signs of serious closed head injury.  You can expect to be sore and stiff for the next few days.  Take OTC Tylenol and Motrin as needed.  Take the muscle relaxant as needed.

## 2022-11-24 NOTE — Anesthesia Preprocedure Evaluation (Signed)
Anesthesia Evaluation  Patient identified by MRN, date of birth, ID band Patient awake    Reviewed: Allergy & Precautions, NPO status , Patient's Chart, lab work & pertinent test results  History of Anesthesia Complications (+) PONV and history of anesthetic complications  Airway Mallampati: I   Neck ROM: Full    Dental  (+) Implants   Pulmonary sleep apnea and Continuous Positive Airway Pressure Ventilation    Pulmonary exam normal breath sounds clear to auscultation       Cardiovascular Exercise Tolerance: Good negative cardio ROS Normal cardiovascular exam Rhythm:Regular Rate:Normal  ECG 08/05/22:  SINUS BRADYCARDIA WITH OCCASIONAL PREMATURE SUPRAVENTRICULAR BEATS  NONSPECIFIC T WAVE ABNORMALITY    Neuro/Psych  PSYCHIATRIC DISORDERS Anxiety Depression    negative neurological ROS     GI/Hepatic ,GERD  Medicated and Controlled,,  Endo/Other  diabetes, Type 2  Class 3 obesity  Renal/GU negative Renal ROS     Musculoskeletal   Abdominal   Peds  Hematology negative hematology ROS (+)   Anesthesia Other Findings Cardiology note 01/17/22:  57 y.o. female with  Encounter Diagnoses  Name Primary?   Frequent PVCs Yes   Hyperlipidemia associated with type 2 diabetes mellitus (CMS-HCC)   Hyperlipemia, mixed   Type 2 diabetes mellitus without complication, without long-term current use of insulin (CMS-HCC)   Morbid (severe) obesity due to excess calories (CMS-HCC)   Plan  -No further intervention of preventricular contractions which appear to be benign in nature. Continue diet and exercise as well as treatment of lifestyle actions of which were thoroughly discussed above. We hope that this may continue to reduce concerns and symptoms in the future. Other consideration of medical management will be used as needed if patient has significant change in symptoms in the future. -We have discussed risk reduction in the  cardiovascular disease process by a continuation of lipid management with the current medication management for lipid reduction. The goals continue to be 30-50% lowering of LDL cholesterol in addition to lifestyle measures. This will include diet and improved activity level on a regular basis.The patient has an understanding of this discussion at this time and we will continue the appropriate strategy. -The patient has had instruction and discussion today about the significant benefits of losing greater than 5% of current weight to reduce overall BMI and mortality. They are encouraged to work on multiple fronts to achieve this goal including a lifestyle program, restriction of daily calories, a low impact exercise program, and a diet low in fat and carbohydrate. -Continue aggressive medical management of diabetes following the ABCs for prevention of cardiovascular disease and complications. The goals set forth include a goal HbA1c of less than 7, moderate to high intensity statin use if patient can tolerate, and a goal systolic blood pressure of below 117m.  -The patient will continue appropriate use of CPAP machine for health benefits all of which have been discussed today. -Patient was counseled today about the significance of diet and exercise for further risk reduction of cardiovascular events, risk factor reduction, maintaining an appropriate body mass index and weight, and possible improvements in quality of life measures. These lifestyle modifications were discussed including a walking regimen and/or exercise prescription, a diet rich in fruits and vegetables, the DASH diet, and appropriate use of medication management.  Orders Placed This Encounter  Procedures   ECG 12-lead   Return in about 1 year (around 01/18/2023).    Reproductive/Obstetrics  Anesthesia Physical Anesthesia Plan  ASA: 3  Anesthesia Plan: General   Post-op Pain  Management:    Induction: Intravenous  PONV Risk Score and Plan: 4 or greater and Propofol infusion, TIVA and Treatment may vary due to age or medical condition  Airway Management Planned: Natural Airway  Additional Equipment:   Intra-op Plan:   Post-operative Plan:   Informed Consent: I have reviewed the patients History and Physical, chart, labs and discussed the procedure including the risks, benefits and alternatives for the proposed anesthesia with the patient or authorized representative who has indicated his/her understanding and acceptance.       Plan Discussed with: CRNA  Anesthesia Plan Comments: (LMA/GETA backup discussed.  Patient consented for risks of anesthesia including but not limited to:  - adverse reactions to medications - damage to eyes, teeth, lips or other oral mucosa - nerve damage due to positioning  - sore throat or hoarseness - damage to heart, brain, nerves, lungs, other parts of body or loss of life  Informed patient about role of CRNA in peri- and intra-operative care.  Patient voiced understanding.)        Anesthesia Quick Evaluation

## 2022-11-27 ENCOUNTER — Ambulatory Visit
Admission: RE | Admit: 2022-11-27 | Discharge: 2022-11-27 | Disposition: A | Discharge status: Home / Self Care | Payer: No Typology Code available for payment source | Attending: Gastroenterology | Admitting: Gastroenterology

## 2022-11-27 ENCOUNTER — Ambulatory Visit: Payer: No Typology Code available for payment source | Admitting: Anesthesiology

## 2022-11-27 ENCOUNTER — Encounter
Admission: RE | Disposition: A | Discharge status: Home / Self Care | Payer: Self-pay | Source: Home / Self Care | Attending: Gastroenterology

## 2022-11-27 ENCOUNTER — Encounter: Payer: Self-pay | Admitting: Gastroenterology

## 2022-11-27 DIAGNOSIS — Z6841 Body Mass Index (BMI) 40.0 and over, adult: Secondary | ICD-10-CM | POA: Diagnosis not present

## 2022-11-27 DIAGNOSIS — D124 Benign neoplasm of descending colon: Secondary | ICD-10-CM | POA: Insufficient documentation

## 2022-11-27 DIAGNOSIS — Z800 Family history of malignant neoplasm of digestive organs: Secondary | ICD-10-CM | POA: Insufficient documentation

## 2022-11-27 DIAGNOSIS — E119 Type 2 diabetes mellitus without complications: Secondary | ICD-10-CM | POA: Diagnosis not present

## 2022-11-27 DIAGNOSIS — Z9049 Acquired absence of other specified parts of digestive tract: Secondary | ICD-10-CM | POA: Diagnosis not present

## 2022-11-27 DIAGNOSIS — D128 Benign neoplasm of rectum: Secondary | ICD-10-CM | POA: Diagnosis not present

## 2022-11-27 DIAGNOSIS — Z1211 Encounter for screening for malignant neoplasm of colon: Secondary | ICD-10-CM | POA: Insufficient documentation

## 2022-11-27 DIAGNOSIS — D120 Benign neoplasm of cecum: Secondary | ICD-10-CM | POA: Insufficient documentation

## 2022-11-27 DIAGNOSIS — D125 Benign neoplasm of sigmoid colon: Secondary | ICD-10-CM | POA: Diagnosis not present

## 2022-11-27 DIAGNOSIS — E782 Mixed hyperlipidemia: Secondary | ICD-10-CM | POA: Diagnosis not present

## 2022-11-27 DIAGNOSIS — G4730 Sleep apnea, unspecified: Secondary | ICD-10-CM | POA: Diagnosis not present

## 2022-11-27 DIAGNOSIS — K219 Gastro-esophageal reflux disease without esophagitis: Secondary | ICD-10-CM | POA: Insufficient documentation

## 2022-11-27 DIAGNOSIS — K640 First degree hemorrhoids: Secondary | ICD-10-CM | POA: Diagnosis not present

## 2022-11-27 DIAGNOSIS — Z7984 Long term (current) use of oral hypoglycemic drugs: Secondary | ICD-10-CM | POA: Insufficient documentation

## 2022-11-27 DIAGNOSIS — K5730 Diverticulosis of large intestine without perforation or abscess without bleeding: Secondary | ICD-10-CM | POA: Insufficient documentation

## 2022-11-27 DIAGNOSIS — Z90721 Acquired absence of ovaries, unilateral: Secondary | ICD-10-CM | POA: Insufficient documentation

## 2022-11-27 HISTORY — PX: COLONOSCOPY: SHX5424

## 2022-11-27 LAB — GLUCOSE, CAPILLARY: Glucose-Capillary: 168 mg/dL — ABNORMAL HIGH (ref 70–99)

## 2022-11-27 SURGERY — COLONOSCOPY
Anesthesia: General

## 2022-11-27 MED ORDER — PROPOFOL 10 MG/ML IV BOLUS
INTRAVENOUS | Status: AC
  Filled 2022-11-27: qty 40

## 2022-11-27 MED ORDER — PROPOFOL 10 MG/ML IV BOLUS
INTRAVENOUS | Status: DC | PRN
  Administered 2022-11-27: 60 mg via INTRAVENOUS
  Administered 2022-11-27: 40 mg via INTRAVENOUS
  Administered 2022-11-27: 60 mg via INTRAVENOUS
  Administered 2022-11-27 (×2): 40 mg via INTRAVENOUS
  Administered 2022-11-27: 60 mg via INTRAVENOUS
  Administered 2022-11-27 (×4): 40 mg via INTRAVENOUS
  Administered 2022-11-27: 100 mg via INTRAVENOUS
  Administered 2022-11-27: 40 mg via INTRAVENOUS

## 2022-11-27 MED ORDER — VASOPRESSIN 20 UNIT/ML IV SOLN
INTRAVENOUS | Status: AC
  Filled 2022-11-27: qty 1

## 2022-11-27 MED ORDER — PROPOFOL 1000 MG/100ML IV EMUL
INTRAVENOUS | Status: AC
  Filled 2022-11-27: qty 200

## 2022-11-27 MED ORDER — SODIUM CHLORIDE 0.9 % IV SOLN: INTRAVENOUS | Status: DC

## 2022-11-27 MED ORDER — PHENYLEPHRINE 80 MCG/ML (10ML) SYRINGE FOR IV PUSH (FOR BLOOD PRESSURE SUPPORT)
PREFILLED_SYRINGE | INTRAVENOUS | Status: AC
  Filled 2022-11-27: qty 10

## 2022-11-27 MED ORDER — LIDOCAINE HCL (PF) 2 % IJ SOLN
INTRAMUSCULAR | Status: AC
  Filled 2022-11-27: qty 5

## 2022-11-27 MED ORDER — LIDOCAINE HCL (PF) 2 % IJ SOLN
INTRAMUSCULAR | Status: AC
  Filled 2022-11-27: qty 10

## 2022-11-27 MED ORDER — LIDOCAINE HCL (CARDIAC) PF 100 MG/5ML IV SOSY
PREFILLED_SYRINGE | INTRAVENOUS | Status: DC | PRN
  Administered 2022-11-27: 50 mg via INTRAVENOUS

## 2022-11-27 MED ORDER — PHENYLEPHRINE 80 MCG/ML (10ML) SYRINGE FOR IV PUSH (FOR BLOOD PRESSURE SUPPORT)
PREFILLED_SYRINGE | INTRAVENOUS | Status: DC | PRN
  Administered 2022-11-27 (×5): 80 ug via INTRAVENOUS

## 2022-11-27 NOTE — H&P (Signed)
Pre-Procedure H&P   Patient ID: Mary Macias is a 57 y.o. female.  Gastroenterology Provider: Annamaria Helling, DO  Referring Provider: Laurine Blazer, PA PCP: Glean Hess, MD  Date: 11/27/2022  HPI Ms. Mary Macias is a 57 y.o. female who presents today for Colonoscopy for screening- fhx colon cancer .  Last colonoscopy 07/2017 with fair prep, hyperplastic polyps and sigmoid diverticulosis internal hemorrhoids  Has BM every 3 days requiring stimulant laxative.  Towards the end of this.  She notes abdominal cramping which is relieved by the bowel movement.  *This postcholecystectomy and hysterectomy  Most recent lab work hemoglobin 15.3 platelets 280,000 creatinine 0.8  Mother- crc 81; maternal aunt crc late 37s   Past Medical History:  Diagnosis Date   Allergy    Complication of anesthesia    slow to wake up   Diabetes mellitus without complication (HCC)    Pneumonia    PONV (postoperative nausea and vomiting)    Sleep apnea    cpap   Stable angina pectoris 08/10/2017    Past Surgical History:  Procedure Laterality Date   CHOLECYSTECTOMY     COLONOSCOPY WITH PROPOFOL N/A 07/13/2017   Procedure: COLONOSCOPY WITH PROPOFOL;  Surgeon: Lollie Sails, MD;  Location: [facility] ENDOSCOPY;  Service: Endoscopy;  Laterality: N/A;   dental implants Right    NASAL SINUS SURGERY     OOPHORECTOMY     SHOULDER ARTHROSCOPY WITH ROTATOR CUFF REPAIR AND SUBACROMIAL DECOMPRESSION Right 11/22/2020   Procedure: Right shoulder arthroscopic rotator cuff repair, subacromial decompression, and biceps tenodesis ,distal clavicle excision;  Surgeon: Leim Fabry, MD;  Location: [facility] ORS;  Service: Orthopedics;  Laterality: Right;   TOTAL VAGINAL HYSTERECTOMY     TUBAL LIGATION      Family History Mother- crc 37; maternal aunt crc late 50s No h/o GI disease or malignancy  Review of Systems  Constitutional:  Negative for activity change, appetite change, chills,  diaphoresis, fatigue, fever and unexpected weight change.  HENT:  Negative for trouble swallowing and voice change.   Respiratory:  Negative for shortness of breath and wheezing.   Cardiovascular:  Negative for chest pain, palpitations and leg swelling.  Gastrointestinal:  Positive for constipation. Negative for abdominal distention, abdominal pain, anal bleeding, blood in stool, diarrhea, nausea, rectal pain and vomiting.  Musculoskeletal:  Negative for arthralgias and myalgias.  Skin:  Negative for color change and pallor.  Neurological:  Negative for dizziness, syncope and weakness.  Psychiatric/Behavioral:  Negative for confusion.   All other systems reviewed and are negative.    Medications No current facility-administered medications on file prior to encounter.   Current Outpatient Medications on File Prior to Encounter  Medication Sig Dispense Refill   escitalopram (LEXAPRO) 10 MG tablet TAKE 1 TABLET BY MOUTH EVERY DAY (Patient taking differently: Take 10 mg by mouth daily.) 90 tablet 1   acyclovir ointment (ZOVIRAX) 5 % APPLY 1 APPLICATION TOPICALLY DAILY AS NEEDED (COLD SORES). 15 g 2   atomoxetine (STRATTERA) 60 MG capsule Take 60 mg by mouth every morning.     b complex vitamins capsule Take 1 capsule by mouth daily.     BIOTIN PO Take by mouth.     Cholecalciferol (DIALYVITE VITAMIN D 5000) 125 MCG (5000 UT) capsule Take 5,000 Units by mouth 3 (three) times a week.     Continuous Blood Gluc Sensor (FREESTYLE LIBRE 14 DAY SENSOR) MISC 1 each by Does not apply route every 14 (fourteen) days. 6  each 1   EPINEPHrine 0.3 mg/0.3 mL IJ SOAJ injection Inject 0.3 mg into the muscle as needed for anaphylaxis. 1 each 1   famotidine-calcium carbonate-magnesium hydroxide (PEPCID COMPLETE) 10-800-165 MG chewable tablet Chew 1 tablet by mouth daily as needed (indigestion).     FARXIGA 10 MG TABS tablet TAKE 1 TABLET BY MOUTH EVERY DAY BEFORE BREAKFAST 30 tablet 2   fenofibrate (TRICOR) 145  MG tablet TAKE 1 TABLET BY MOUTH EVERY DAY 90 tablet 1   fexofenadine (ALLEGRA) 180 MG tablet Take by mouth.     MAGNESIUM PO Take by mouth daily.     NON FORMULARY at bedtime. Auto Cpap     nystatin cream (MYCOSTATIN) Apply 1 application topically 2 (two) times daily. 30 g 0   Polyethylene Glycol 3350 (MIRALAX PO) Take by mouth as needed.     pravastatin (PRAVACHOL) 40 MG tablet Take 40 mg by mouth daily.     Probiotic Product (PROBIOTIC PO) Take by mouth daily.     valACYclovir (VALTREX) 1000 MG tablet TAKE 1 TABLET (1,000 MG TOTAL) BY MOUTH AS NEEDED (COLD SORES). 30 tablet 2    Pertinent medications related to GI and procedure were reviewed by me with the patient prior to the procedure   Current Facility-Administered Medications:    0.9 %  sodium chloride infusion, , Intravenous, Continuous, Annamaria Helling, DO, Last Rate: 20 mL/hr at 11/27/22 0740, Continued from Pre-op at 11/27/22 0740  sodium chloride 20 mL/hr at 11/27/22 0740       Allergies  Allergen Reactions   Peanut-Containing Drug Products Hives and Swelling    Uses EPI-PEN As Needed for this.   Povidone-Iodine Other (See Comments) and Rash    Other Reaction: Other reaction Other Reaction: Other reaction   Atorvastatin Other (See Comments)    Severe body aches   Meloxicam Other (See Comments)    Kidney function reduced  Kidney function reduced   Kidney function reduced   Povidone Iodine Rash   Propoxyphene Rash    Uncoded Allergy. Allergen: Citrus fruits   Iodine    Mounjaro [Tirzepatide] Other (See Comments)    Flu like symptoms    Cephalexin Rash and Other (See Comments)   Metformin And Related Diarrhea   Trulicity [Dulaglutide]     Abdominal discomfort, flatus   Allergies were reviewed by me prior to the procedure  Objective   Body mass index is 46.16 kg/m. Vitals:   11/27/22 0706 11/27/22 0711  BP:  (!) 155/91  Pulse:  66  Resp:  18  Temp:  98.3 F (36.8 C)  TempSrc:  Temporal  SpO2:   95%  Weight: 129.7 kg   Height: 5' 6"$  (1.676 m)      Physical Exam Vitals and nursing note reviewed.  Constitutional:      General: She is not in acute distress.    Appearance: Normal appearance. She is obese. She is not ill-appearing, toxic-appearing or diaphoretic.  HENT:     Head: Normocephalic and atraumatic.     Nose: Nose normal.     Mouth/Throat:     Mouth: Mucous membranes are moist.     Pharynx: Oropharynx is clear.  Eyes:     General: No scleral icterus.    Extraocular Movements: Extraocular movements intact.  Cardiovascular:     Rate and Rhythm: Normal rate and regular rhythm.     Heart sounds: Normal heart sounds. No murmur heard.    No friction rub. No gallop.  Pulmonary:  Effort: Pulmonary effort is normal. No respiratory distress.     Breath sounds: Normal breath sounds. No wheezing, rhonchi or rales.  Abdominal:     General: Bowel sounds are normal. There is no distension.     Palpations: Abdomen is soft.     Tenderness: There is no abdominal tenderness. There is no guarding or rebound.  Musculoskeletal:     Cervical back: Neck supple.     Right lower leg: No edema.     Left lower leg: No edema.  Skin:    General: Skin is warm and dry.     Coloration: Skin is not jaundiced or pale.  Neurological:     General: No focal deficit present.     Mental Status: She is alert and oriented to person, place, and time. Mental status is at baseline.  Psychiatric:        Mood and Affect: Mood normal.        Behavior: Behavior normal.        Thought Content: Thought content normal.        Judgment: Judgment normal.      Assessment:  Ms. Mary Macias is a 57 y.o. female  who presents today for Colonoscopy for screening- fhx colon cancer .  Plan:  Colonoscopy with possible intervention today  Colonoscopy with possible biopsy, control of bleeding, polypectomy, and interventions as necessary has been discussed with the patient/patient representative.  Informed consent was obtained from the patient/patient representative after explaining the indication, nature, and risks of the procedure including but not limited to death, bleeding, perforation, missed neoplasm/lesions, cardiorespiratory compromise, and reaction to medications. Opportunity for questions was given and appropriate answers were provided. Patient/patient representative has verbalized understanding is amenable to undergoing the procedure.   Annamaria Helling, DO  [facility] Gastroenterology  Portions of the record may have been created with voice recognition software. Occasional wrong-word or 'sound-a-like' substitutions may have occurred due to the inherent limitations of voice recognition software.  Read the chart carefully and recognize, using context, where substitutions may have occurred.

## 2022-11-27 NOTE — Transfer of Care (Signed)
Immediate Anesthesia Transfer of Care Note  Patient: Deztinee Aiken Lowenstein  Procedure(s) Performed: COLONOSCOPY  Patient Location: PACU  Anesthesia Type:General  Level of Consciousness: awake, alert , and oriented  Airway & Oxygen Therapy: Patient Spontanous Breathing and Patient connected to nasal cannula oxygen  Post-op Assessment: Report given to RN, Post -op Vital signs reviewed and stable, and Patient moving all extremities  Post vital signs: Reviewed and stable  Last Vitals:  Vitals Value Taken Time  BP 119/63 11/27/22 0828  Temp    Pulse 83 11/27/22 0828  Resp 17 11/27/22 0828  SpO2 99 % 11/27/22 0828  Vitals shown include unvalidated device data.  Last Pain:  Vitals:   11/27/22 0827  TempSrc: Tympanic  PainSc: Asleep         Complications: No notable events documented.

## 2022-11-27 NOTE — Interval H&P Note (Signed)
History and Physical Interval Note: Preprocedure H&P from 11/27/22  was reviewed and there was no interval change after seeing and examining the patient.  Written consent was obtained from the patient after discussion of risks, benefits, and alternatives. Patient has consented to proceed with Colonoscopy with possible intervention   11/27/2022 7:48 AM  [name]  has presented today for surgery, with the diagnosis of Family history of colon cancer (Z80.0).  The various methods of treatment have been discussed with the patient and family. After consideration of risks, benefits and other options for treatment, the patient has consented to  Procedure(s): COLONOSCOPY (N/A) as a surgical intervention.  The patient's history has been reviewed, patient examined, no change in status, stable for surgery.  I have reviewed the patient's chart and labs.  Questions were answered to the patient's satisfaction.     Mary Macias

## 2022-11-27 NOTE — Op Note (Signed)
[facility] Gastroenterology Patient Name: Mary Macias Procedure Date: 11/27/2022 7:48 AM MRN: [id number] Account #: [id number] Date of Birth: 1966-02-13 Admit Type: Outpatient Age: 57 Room: [facility] ENDO ROOM 2 Gender: Female Note Status: Finalized Instrument Name: Jasper Riling [id number] Procedure:             Colonoscopy Indications:           Screening patient at increased risk: Family history of                         colorectal cancer in multiple 1st-degree relatives Providers:             Annamaria Helling DO, DO Referring MD:          No Local Md, MD (Referring MD) Medicines:             Monitored Anesthesia Care Complications:         No immediate complications. Estimated blood loss:                         Minimal. Procedure:             Pre-Anesthesia Assessment:                        - Prior to the procedure, a History and Physical was                         performed, and patient medications and allergies were                         reviewed. The patient is competent. The risks and                         benefits of the procedure and the sedation options and                         risks were discussed with the patient. All questions                         were answered and informed consent was obtained.                         Patient identification and proposed procedure were                         verified by the physician, the nurse, the anesthetist                         and the technician in the endoscopy suite. Mental                         Status Examination: alert and oriented. Airway                         Examination: normal oropharyngeal airway and neck                         mobility. Respiratory Examination: clear to  auscultation. CV Examination: RRR, no murmurs, no S3                         or S4. Prophylactic Antibiotics: The patient does not                         require prophylactic antibiotics.  Prior                         Anticoagulants: The patient has taken no anticoagulant                         or antiplatelet agents. ASA Grade Assessment: III - A                         patient with severe systemic disease. After reviewing                         the risks and benefits, the patient was deemed in                         satisfactory condition to undergo the procedure. The                         anesthesia plan was to use monitored anesthesia care                         (MAC). Immediately prior to administration of                         medications, the patient was re-assessed for adequacy                         to receive sedatives. The heart rate, respiratory                         rate, oxygen saturations, blood pressure, adequacy of                         pulmonary ventilation, and response to care were                         monitored throughout the procedure. The physical                         status of the patient was re-assessed after the                         procedure.                        After obtaining informed consent, the colonoscope was                         passed under direct vision. Throughout the procedure,                         the patient's blood pressure, pulse, and oxygen  saturations were monitored continuously. The                         Colonoscope was introduced through the anus and                         advanced to the the cecum, identified by appendiceal                         orifice and ileocecal valve. The colonoscopy was                         performed without difficulty. The patient tolerated                         the procedure well. The quality of the bowel                         preparation was evaluated using the BBPS [facility] Bowel                         Preparation Scale) with scores of: Right Colon = 2                         (minor amount of residual staining, small fragments of                          stool and/or opaque liquid, but mucosa seen well),                         Transverse Colon = 3 (entire mucosa seen well with no                         residual staining, small fragments of stool or opaque                         liquid) and Left Colon = 2 (minor amount of residual                         staining, small fragments of stool and/or opaque                         liquid, but mucosa seen well). The total BBPS score                         equals 7. The quality of the bowel preparation was                         good. The ileocecal valve, appendiceal orifice, and                         rectum were photographed. Findings:      The perianal and digital rectal examinations were normal. Pertinent       negatives include normal sphincter tone.      Non-bleeding internal hemorrhoids were found during retroflexion. The       hemorrhoids were Grade I (internal hemorrhoids that do not  prolapse).       Estimated blood loss: none.      The recto-sigmoid colon and sigmoid colon were moderately tortuous.       Estimated blood loss: none.      Six sessile polyps were found in the rectum (1), sigmoid colon (1),       descending colon (3) and cecum (1). The polyps were 1 to 2 mm in size.       These polyps were removed with a jumbo cold forceps. Resection and       retrieval were complete. Estimated blood loss was minimal.      The exam was otherwise without abnormality on direct and retroflexion       views. Impression:            - Non-bleeding internal hemorrhoids.                        - Tortuous colon.                        - Six 1 to 2 mm polyps in the rectum, in the sigmoid                         colon, in the descending colon and in the cecum,                         removed with a jumbo cold forceps. Resected and                         retrieved.                        - The examination was otherwise normal on direct and                          retroflexion views. Recommendation:        - Patient has a contact number available for                         emergencies. The signs and symptoms of potential                         delayed complications were discussed with the patient.                         Return to normal activities tomorrow. Written                         discharge instructions were provided to the patient.                        - Discharge patient to home.                        - Resume previous diet.                        - Continue present medications.                        - No ibuprofen,  naproxen, or other non-steroidal                         anti-inflammatory drugs for 5 days after polyp removal.                        - Await pathology results.                        - Repeat colonoscopy for surveillance based on                         pathology results.                        - Return to referring physician as previously                         scheduled.                        - The findings and recommendations were discussed with                         the patient. Procedure Code(s):     --- Professional ---                        [id number], Colonoscopy, flexible; with biopsy, single or                         multiple Diagnosis Code(s):     --- Professional ---                        Z80.0, Family history of malignant neoplasm of                         digestive organs                        D12.8, Benign neoplasm of rectum                        D12.5, Benign neoplasm of sigmoid colon                        D12.4, Benign neoplasm of descending colon                        D12.0, Benign neoplasm of cecum                        K64.0, First degree hemorrhoids                        Q43.8, Other specified congenital malformations of                         intestine CPT copyright 2022 American Medical Association. All rights reserved. The codes documented in this report are preliminary and upon  coder review may  be revised to meet current compliance requirements. Attending Participation:      I personally performed the entire procedure. Remo Lipps  Virgina Jock, DO Annamaria Helling DO, DO 11/27/2022 8:29:09 AM This report has been signed electronically. Number of Addenda: 0 Note Initiated On: 11/27/2022 7:48 AM Scope Withdrawal Time: 0 hours 18 minutes 41 seconds  Total Procedure Duration: 0 hours 30 minutes 29 seconds  Estimated Blood Loss:  Estimated blood loss was minimal.      [facility]

## 2022-11-27 NOTE — Anesthesia Postprocedure Evaluation (Signed)
Anesthesia Post Note  Patient: Mary Macias  Procedure(s) Performed: COLONOSCOPY  Patient location during evaluation: PACU Anesthesia Type: General Level of consciousness: awake and alert, oriented and patient cooperative Pain management: pain level controlled Vital Signs Assessment: post-procedure vital signs reviewed and stable Respiratory status: spontaneous breathing, nonlabored ventilation and respiratory function stable Cardiovascular status: blood pressure returned to baseline and stable Postop Assessment: adequate PO intake Anesthetic complications: no   No notable events documented.   Last Vitals:  Vitals:   11/27/22 0837 11/27/22 0847  BP: 110/67 117/78  Pulse: 74 61  Resp: 18 16  Temp:    SpO2: 97% 98%    Last Pain:  Vitals:   11/27/22 0847  TempSrc:   PainSc: 0-No pain                 Darrin Nipper

## 2022-11-28 LAB — SURGICAL PATHOLOGY

## 2022-12-06 ENCOUNTER — Telehealth: Payer: Self-pay

## 2022-12-06 NOTE — Telephone Encounter (Signed)
Error pt is no longer taking the medication.  KP

## 2022-12-06 NOTE — Telephone Encounter (Signed)
PA completed waiting on insurance approval.  Key: [id number]  [id number]

## 2022-12-15 ENCOUNTER — Other Ambulatory Visit: Payer: Self-pay | Admitting: Internal Medicine

## 2022-12-15 DIAGNOSIS — E118 Type 2 diabetes mellitus with unspecified complications: Secondary | ICD-10-CM

## 2022-12-15 NOTE — Telephone Encounter (Signed)
Requested Prescriptions  Pending Prescriptions Disp Refills   FARXIGA 10 MG TABS tablet [Pharmacy Med Name: FARXIGA 10 MG TABLET] 30 tablet 2    Sig: TAKE 1 TABLET BY MOUTH EVERY DAY BEFORE BREAKFAST     Endocrinology:  Diabetes - SGLT2 Inhibitors Passed - 12/15/2022  1:52 AM      Passed - Cr in normal range and within 360 days    Creatinine  Date Value Ref Range Status  08/05/2022 0.9 0.5 - 1.1 Final   Creatinine, Ser  Date Value Ref Range Status  04/27/2022 1.07 (H) 0.57 - 1.00 mg/dL Final         Passed - HBA1C is between 0 and 7.9 and within 180 days    Hemoglobin A1C  Date Value Ref Range Status  09/05/2022 5.8 (A) 4.0 - 5.6 % Final   Hgb A1c MFr Bld  Date Value Ref Range Status  04/27/2022 6.1 (H) 4.8 - 5.6 % Final    Comment:             Prediabetes: 5.7 - 6.4          Diabetes: >6.4          Glycemic control for adults with diabetes: <7.0          Passed - eGFR in normal range and within 360 days    GFR calc Af Amer  Date Value Ref Range Status  09/29/2020 90 >59 mL/min/1.73 Final    Comment:    **In accordance with recommendations from the NKF-ASN Task force,**   [facility] is in the process of updating its eGFR calculation to the   2021 CKD-EPI creatinine equation that estimates kidney function   without a race variable.    GFR calc non Af Amer  Date Value Ref Range Status  09/29/2020 78 >59 mL/min/1.73 Final   eGFR  Date Value Ref Range Status  04/27/2022 61 >59 mL/min/1.73 Final         Passed - Valid encounter within last 6 months    Recent Outpatient Visits           3 months ago Type II diabetes mellitus with complication [facility])   [place] [facility], Jesse Sans, MD   4 months ago Type II diabetes mellitus with complication [facility])    [facility] at [facility], Jesse Sans, MD   7 months ago Type II diabetes mellitus with complication [facility])   [place] at [facility], Jesse Sans, MD   12 months ago Annual physical exam   [facility] at [facility], Jesse Sans, MD   1 year ago Viral URI with cough   [facility] [place] at [facility], Jesse Sans, MD       Future Appointments             In 1 week [facility] Melia, Jesse Sans, MD [place] at [facility], [facility]

## 2022-12-22 ENCOUNTER — Ambulatory Visit (INDEPENDENT_AMBULATORY_CARE_PROVIDER_SITE_OTHER): Payer: 59 | Admitting: Internal Medicine

## 2022-12-22 ENCOUNTER — Encounter: Payer: Self-pay | Admitting: Internal Medicine

## 2022-12-22 VITALS — BP 140/90 | HR 67 | Ht 66.0 in | Wt 288.0 lb

## 2022-12-22 DIAGNOSIS — F418 Other specified anxiety disorders: Secondary | ICD-10-CM | POA: Diagnosis not present

## 2022-12-22 DIAGNOSIS — Z0000 Encounter for general adult medical examination without abnormal findings: Secondary | ICD-10-CM | POA: Diagnosis not present

## 2022-12-22 DIAGNOSIS — E1169 Type 2 diabetes mellitus with other specified complication: Secondary | ICD-10-CM | POA: Diagnosis not present

## 2022-12-22 DIAGNOSIS — E118 Type 2 diabetes mellitus with unspecified complications: Secondary | ICD-10-CM

## 2022-12-22 DIAGNOSIS — Z1231 Encounter for screening mammogram for malignant neoplasm of breast: Secondary | ICD-10-CM

## 2022-12-22 DIAGNOSIS — Z6841 Body Mass Index (BMI) 40.0 and over, adult: Secondary | ICD-10-CM

## 2022-12-22 DIAGNOSIS — Z90710 Acquired absence of both cervix and uterus: Secondary | ICD-10-CM | POA: Diagnosis not present

## 2022-12-22 DIAGNOSIS — E785 Hyperlipidemia, unspecified: Secondary | ICD-10-CM | POA: Diagnosis not present

## 2022-12-22 NOTE — Progress Notes (Signed)
Date:  12/22/2022   Name:  Mary Macias   DOB:  October 20, 1965   MRN:  [id number]   Chief Complaint: Annual Exam (Has not been taking cholesterol was not having side effect, she stated pill was uncoated/ /) Mary Macias is a 57 y.o. female who presents today for her Complete Annual Exam. She feels well. She reports exercising none. She reports she is sleeping well. Breast complaints none.  Mammogram: 06/2022 DEXA: none Pap smear: discontinued Colonoscopy: 11/2022 repeat 3 yr  Health Maintenance Due  Topic Date Due   COVID-19 Vaccine (7 - 2023-24 season) 06/09/2022   Diabetic kidney evaluation - Urine ACR  12/21/2022    Immunization History  Administered Date(s) Administered   Influenza,inj,Quad PF,6+ Mos 10/12/2015, 07/14/2016, 06/14/2018, 08/07/2019, 06/03/2020, 09/05/2022   Influenza-Unspecified 08/08/2017   PFIZER Comirnaty(Gray Top)Covid-19 Tri-Sucrose Vaccine 12/12/2019, 12/29/2019   PFIZER(Purple Top)SARS-COV-2 Vaccination 12/12/2019, 12/29/2019, 08/15/2020   PNEUMOCOCCAL CONJUGATE-20 12/20/2021   Pfizer Covid-19 Vaccine Bivalent Booster 91yrs & up 08/23/2021   Pneumococcal Polysaccharide-23 01/25/2018   Tdap 01/07/2015   Zoster Recombinat (Shingrix) 11/17/2019, 01/19/2020    Diabetes She presents for her follow-up diabetic visit. She has type 2 diabetes mellitus. Pertinent negatives for hypoglycemia include no dizziness, headaches, nervousness/anxiousness or tremors. Pertinent negatives for diabetes include no chest pain, no fatigue, no polydipsia and no polyuria. Current diabetic treatment includes oral agent (monotherapy) (farxiga).  Hyperlipidemia This is a chronic problem. The problem is uncontrolled. Recent lipid tests were reviewed and are variable. Pertinent negatives include no chest pain or shortness of breath. Current antihyperlipidemic treatment includes statins. The current treatment provides moderate improvement of lipids.  Depression/ADD - off  strattera due to constipation and for colonoscopy.  Increased her Lexapro to 15 mg on her own.  Seeing psych in several weeks.  Lab Results  Component Value Date   NA 139 08/05/2022   K 4.1 08/05/2022   CO2 26 (A) 08/05/2022   GLUCOSE 85 04/27/2022   BUN 8 08/05/2022   CREATININE 0.9 08/05/2022   CALCIUM 9.3 04/27/2022   EGFR 61 04/27/2022   GFRNONAA 78 09/29/2020   Lab Results  Component Value Date   CHOL 200 (H) 12/20/2021   HDL 51 12/20/2021   LDLCALC 115 (H) 12/20/2021   TRIG 195 (H) 12/20/2021   CHOLHDL 3.9 12/20/2021   Lab Results  Component Value Date   TSH 1.90 08/05/2022   Lab Results  Component Value Date   HGBA1C 5.8 (A) 09/05/2022   Lab Results  Component Value Date   WBC 7.9 08/05/2022   HGB 15.3 08/05/2022   HCT 45 08/05/2022   MCV 94 12/20/2021   PLT 288 08/05/2022   Lab Results  Component Value Date   ALT 15 08/05/2022   AST 24 08/05/2022   ALKPHOS 52 08/05/2022   BILITOT 0.4 04/27/2022   Lab Results  Component Value Date   VD25OH 41.0 12/20/2021     Review of Systems  Constitutional:  Negative for chills, fatigue and fever.  HENT:  Negative for congestion, hearing loss, tinnitus, trouble swallowing and voice change.   Eyes:  Negative for visual disturbance.  Respiratory:  Negative for cough, chest tightness, shortness of breath and wheezing.   Cardiovascular:  Negative for chest pain, palpitations and leg swelling.  Gastrointestinal:  Negative for abdominal pain, constipation, diarrhea and vomiting.  Endocrine: Negative for polydipsia and polyuria.  Genitourinary:  Negative for dysuria, frequency, genital sores, vaginal bleeding and vaginal discharge.  Musculoskeletal:  Negative for  arthralgias, gait problem and joint swelling.  Skin:  Negative for color change and rash.  Neurological:  Negative for dizziness, tremors, light-headedness and headaches.  Hematological:  Negative for adenopathy. Does not bruise/bleed easily.   Psychiatric/Behavioral:  Negative for dysphoric mood and sleep disturbance. The patient is not nervous/anxious.     Patient Active Problem List   Diagnosis Date Noted   Adult ADHD 04/28/2021   Hx of total hysterectomy 12/15/2020   Nontraumatic complete tear of right rotator cuff 12/15/2020   OSA (obstructive sleep apnea) 02/02/2020   Hepatic steatosis 11/22/2019   Depression with anxiety 08/07/2019   Peripheral edema 02/08/2018   BMI 50.0-59.9, adult ([place]) 01/25/2018   Colon polyp, hyperplastic 07/16/2017   Type II diabetes mellitus with complication ([place]) 99991111   Hyperlipidemia associated with type 2 diabetes mellitus ([place]) 07/08/2015   Environmental and seasonal allergies 07/08/2015   Avitaminosis D 07/08/2015   Beat, premature ventricular 02/11/2015    Allergies  Allergen Reactions   Peanut-Containing Drug Products Hives and Swelling    Uses EPI-PEN As Needed for this.   Povidone-Iodine Other (See Comments) and Rash    Other Reaction: Other reaction Other Reaction: Other reaction   Atorvastatin Other (See Comments)    Severe body aches   Meloxicam Other (See Comments)    Kidney function reduced  Kidney function reduced   Kidney function reduced   Povidone Iodine Rash   Propoxyphene Rash    Uncoded Allergy. Allergen: Citrus fruits   Iodine    Mounjaro [Tirzepatide] Other (See Comments)    Flu like symptoms    Cephalexin Rash and Other (See Comments)   Metformin And Related Diarrhea   Trulicity [Dulaglutide]     Abdominal discomfort, flatus    Past Surgical History:  Procedure Laterality Date   CHOLECYSTECTOMY     COLONOSCOPY N/A 11/27/2022   Procedure: COLONOSCOPY;  Surgeon: Annamaria Helling, DO;  Location: [facility] ENDOSCOPY;  Service: Gastroenterology;  Laterality: N/A;   COLONOSCOPY WITH PROPOFOL N/A 07/13/2017   Procedure: COLONOSCOPY WITH PROPOFOL;  Surgeon: Lollie Sails, MD;  Location[facility] ENDOSCOPY;  Service: Endoscopy;  Laterality: N/A;    dental implants Right    NASAL SINUS SURGERY     OOPHORECTOMY     SHOULDER ARTHROSCOPY WITH ROTATOR CUFF REPAIR AND SUBACROMIAL DECOMPRESSION Right 11/22/2020   Procedure: Right shoulder arthroscopic rotator cuff repair, subacromial decompression, and biceps tenodesis ,distal clavicle excision;  Surgeon: Leim Fabry, MD;  Location: [facility] ORS;  Service: Orthopedics;  Laterality: Right;   TOTAL VAGINAL HYSTERECTOMY     TUBAL LIGATION      Social History   Tobacco Use   Smoking status: Never   Smokeless tobacco: Never  Vaping Use   Vaping Use: Never used  Substance Use Topics   Alcohol use: No    Alcohol/week: 0.0 standard drinks of alcohol   Drug use: No     Medication list has been reviewed and updated.  Current Meds  Medication Sig   acyclovir ointment (ZOVIRAX) 5 % APPLY 1 APPLICATION TOPICALLY DAILY AS NEEDED (COLD SORES).   b complex vitamins capsule Take 1 capsule by mouth daily.   BIOTIN PO Take by mouth.   Cholecalciferol (DIALYVITE VITAMIN D 5000) 125 MCG (5000 UT) capsule Take 5,000 Units by mouth 3 (three) times a week.   Continuous Blood Gluc Sensor (FREESTYLE LIBRE 14 DAY SENSOR) MISC 1 each by Does not apply route every 14 (fourteen) days.   cyclobenzaprine (FLEXERIL) 5 MG tablet Take 1  tablet (5 mg total) by mouth 3 (three) times daily as needed.   EPINEPHrine 0.3 mg/0.3 mL IJ SOAJ injection Inject 0.3 mg into the muscle as needed for anaphylaxis.   escitalopram (LEXAPRO) 10 MG tablet TAKE 1 TABLET BY MOUTH EVERY DAY (Patient taking differently: Take 10 mg by mouth daily.)   famotidine-calcium carbonate-magnesium hydroxide (PEPCID COMPLETE) 10-800-165 MG chewable tablet Chew 1 tablet by mouth daily as needed (indigestion).   FARXIGA 10 MG TABS tablet TAKE 1 TABLET BY MOUTH EVERY DAY BEFORE BREAKFAST   fexofenadine (ALLEGRA) 180 MG tablet Take by mouth.   MAGNESIUM PO Take by mouth daily.   NON FORMULARY at bedtime. Auto Cpap   nystatin cream (MYCOSTATIN) Apply 1  application topically 2 (two) times daily.   Polyethylene Glycol 3350 (MIRALAX PO) Take by mouth as needed.   Probiotic Product (PROBIOTIC PO) Take by mouth daily.   valACYclovir (VALTREX) 1000 MG tablet TAKE 1 TABLET (1,000 MG TOTAL) BY MOUTH AS NEEDED (COLD SORES).       12/22/2022    4:00 PM 08/09/2022    9:35 AM 04/27/2022   10:35 AM 12/20/2021    8:22 AM  GAD 7 : Generalized Anxiety Score  Nervous, Anxious, on Edge 0 1 1 0  Control/stop worrying 0 1 2 0  Worry too much - different things 0 1 2 0  Trouble relaxing 0 1 1 0  Restless 0 1 1 0  Easily annoyed or irritable  1 2 0  Afraid - awful might happen 0 1 0 0  Total GAD 7 Score  7 9 0  Anxiety Difficulty Not difficult at all Somewhat difficult Very difficult Not difficult at all       12/22/2022    4:00 PM 08/09/2022    9:35 AM 04/27/2022   10:35 AM  Depression screen PHQ 2/9  Decreased Interest 0 2 1  Down, Depressed, Hopeless 0 1 1  PHQ - 2 Score 0 3 2  Altered sleeping 0 1 1  Tired, decreased energy 0 3 1  Change in appetite 0 2 2  Feeling bad or failure about yourself  0 0 0  Trouble concentrating 2 3 2   Moving slowly or fidgety/restless 0 3 0  Suicidal thoughts 0 0 0  PHQ-9 Score 2 15 8   Difficult doing work/chores Not difficult at all Very difficult Very difficult    BP Readings from Last 3 Encounters:  12/22/22 (!) 140/90  11/27/22 117/78  10/09/22 (!) 148/82    Physical Exam Vitals and nursing note reviewed.  Constitutional:      General: She is not in acute distress.    Appearance: She is well-developed.  HENT:     Head: Normocephalic and atraumatic.     Right Ear: Tympanic membrane and ear canal normal.     Left Ear: Tympanic membrane and ear canal normal.     Nose:     Right Sinus: No maxillary sinus tenderness.     Left Sinus: No maxillary sinus tenderness.  Eyes:     General: No scleral icterus.       Right eye: No discharge.        Left eye: No discharge.     Conjunctiva/sclera:  Conjunctivae normal.  Neck:     Thyroid: No thyromegaly.     Vascular: No carotid bruit.  Cardiovascular:     Rate and Rhythm: Normal rate and regular rhythm.     Pulses: Normal pulses.     Heart  sounds: Normal heart sounds.  Pulmonary:     Effort: Pulmonary effort is normal. No respiratory distress.     Breath sounds: No wheezing.  Chest:  Breasts:    Right: No mass, nipple discharge, skin change or tenderness.     Left: No mass, nipple discharge, skin change or tenderness.  Abdominal:     General: Bowel sounds are normal.     Palpations: Abdomen is soft.     Tenderness: There is no abdominal tenderness.  Musculoskeletal:     Cervical back: Normal range of motion. No erythema.     Right lower leg: No edema.     Left lower leg: No edema.  Lymphadenopathy:     Cervical: No cervical adenopathy.  Skin:    General: Skin is warm and dry.     Findings: No rash.  Neurological:     Mental Status: She is alert and oriented to person, place, and time.     Cranial Nerves: No cranial nerve deficit.     Sensory: No sensory deficit.     Deep Tendon Reflexes: Reflexes are normal and symmetric.  Psychiatric:        Attention and Perception: Attention normal.        Mood and Affect: Mood normal.     Wt Readings from Last 3 Encounters:  12/22/22 288 lb (130.6 kg)  11/27/22 286 lb (129.7 kg)  10/09/22 290 lb (131.5 kg)    BP (!) 140/90   Pulse 67   Ht 5\' 6"  (1.676 m)   Wt 288 lb (130.6 kg)   SpO2 97%   BMI 46.48 kg/m   Assessment and Plan:  Problem List Items Addressed This Visit       Endocrine   Hyperlipidemia associated with type 2 diabetes mellitus ([place]) (Chronic)    She is not taking either pravachol or fenofibrate - for unclear reasons LDL is  Lab Results  Component Value Date   LDLCALC 115 (H) 12/20/2021  with a goal of < 70. Discussed the indication for Statins in DM for macro and microvascular disease prevention.         Relevant Orders   Lipid panel    TSH   Type II diabetes mellitus with complication (HCC) (Chronic)    Clinically stable without s/s of hypoglycemia. Tolerating farxiga well without side effects or other concerns.  No longer taking Mounjaro. However, she feels that her A1C is going to be higher. Lab Results  Component Value Date   HGBA1C 5.8 (A) 09/05/2022        Relevant Orders   CBC with Differential/Platelet   Comprehensive metabolic panel   Hemoglobin A1c   Microalbumin / creatinine urine ratio     Other   BMI 50.0-59.9, adult (HCC)   Hx of total hysterectomy   Depression with anxiety (Chronic)    Clinically stable on current regimen with good control of symptoms, No SI or HI. Strattera caused significant constipation; continues Lexapro but increased to 15 mg She will discuss her medications with psych next month        Relevant Orders   Cortisol   Other Visit Diagnoses     Annual physical exam    -  Primary   Relevant Orders   CBC with Differential/Platelet   Comprehensive metabolic panel   Hemoglobin A1c   Lipid panel   TSH   Encounter for screening mammogram for breast cancer       Relevant Orders   MM 3D SCREENING  MAMMOGRAM BILATERAL BREAST       Return in about 4 months (around 04/23/2023) for DM.   Partially dictated using [employer], any errors are not intentional.  Glean Hess, MD [place], [place]

## 2022-12-22 NOTE — Assessment & Plan Note (Addendum)
Clinically stable without s/s of hypoglycemia. Tolerating farxiga well without side effects or other concerns.  No longer taking Mounjaro. However, she feels that her A1C is going to be higher. Lab Results  Component Value Date   HGBA1C 5.8 (A) 09/05/2022

## 2022-12-22 NOTE — Assessment & Plan Note (Addendum)
She is not taking either pravachol or fenofibrate - for unclear reasons LDL is  Lab Results  Component Value Date   LDLCALC 115 (H) 12/20/2021  with a goal of < 70. Discussed the indication for Statins in DM for macro and microvascular disease prevention.

## 2022-12-22 NOTE — Assessment & Plan Note (Signed)
Clinically stable on current regimen with good control of symptoms, No SI or HI. Strattera caused significant constipation; continues Lexapro but increased to 15 mg She will discuss her medications with psych next month

## 2022-12-24 ENCOUNTER — Other Ambulatory Visit: Payer: Self-pay | Admitting: Internal Medicine

## 2023-01-05 ENCOUNTER — Encounter: Payer: 59 | Admitting: Internal Medicine

## 2023-01-19 ENCOUNTER — Encounter: Payer: Self-pay | Admitting: Internal Medicine

## 2023-01-30 ENCOUNTER — Telehealth: Payer: Self-pay

## 2023-01-30 NOTE — Telephone Encounter (Signed)
Called patient and left Vm reminding her to have her labs and urine done at [facility].  Mary Macias

## 2023-01-30 NOTE — Telephone Encounter (Signed)
-----   Message from Reubin Milan, MD sent at 01/30/2023 12:45 PM EDT ----- She needs to get her labs done from March, including a U/Cr and schedule a mammogram.  Please call her. ----- Message ----- From: SYSTEM Sent: 12/27/2022  12:12 AM EDT To: Reubin Milan, MD

## 2023-02-02 ENCOUNTER — Other Ambulatory Visit: Payer: Self-pay | Admitting: Student

## 2023-02-02 DIAGNOSIS — Z789 Other specified health status: Secondary | ICD-10-CM

## 2023-02-02 DIAGNOSIS — E1169 Type 2 diabetes mellitus with other specified complication: Secondary | ICD-10-CM

## 2023-02-07 ENCOUNTER — Ambulatory Visit
Admission: RE | Admit: 2023-02-07 | Discharge: 2023-02-07 | Disposition: A | Discharge status: Home / Self Care | Payer: No Typology Code available for payment source | Source: Ambulatory Visit | Attending: Student | Admitting: Student

## 2023-02-07 DIAGNOSIS — E785 Hyperlipidemia, unspecified: Secondary | ICD-10-CM | POA: Insufficient documentation

## 2023-02-07 DIAGNOSIS — E1169 Type 2 diabetes mellitus with other specified complication: Secondary | ICD-10-CM | POA: Insufficient documentation

## 2023-02-07 DIAGNOSIS — Z789 Other specified health status: Secondary | ICD-10-CM | POA: Insufficient documentation

## 2023-02-07 LAB — CBC WITH DIFFERENTIAL/PLATELET
Basophils Absolute: 0.1 10*3/uL (ref 0.0–0.2)
Basos: 1 %
EOS (ABSOLUTE): 0.3 10*3/uL (ref 0.0–0.4)
Eos: 4 %
Hematocrit: 45.6 % (ref 34.0–46.6)
Hemoglobin: 14.7 g/dL (ref 11.1–15.9)
Immature Grans (Abs): 0 10*3/uL (ref 0.0–0.1)
Immature Granulocytes: 0 %
Lymphocytes Absolute: 2.5 10*3/uL (ref 0.7–3.1)
Lymphs: 35 %
MCH: 30.2 pg (ref 26.6–33.0)
MCHC: 32.2 g/dL (ref 31.5–35.7)
MCV: 94 fL (ref 79–97)
Monocytes Absolute: 0.5 10*3/uL (ref 0.1–0.9)
Monocytes: 7 %
Neutrophils Absolute: 3.8 10*3/uL (ref 1.4–7.0)
Neutrophils: 53 %
Platelets: 231 10*3/uL (ref 150–450)
RBC: 4.86 x10E6/uL (ref 3.77–5.28)
RDW: 12.3 % (ref 11.7–15.4)
WBC: 7.1 10*3/uL (ref 3.4–10.8)

## 2023-02-07 LAB — COMPREHENSIVE METABOLIC PANEL
ALT: 27 IU/L (ref 0–32)
AST: 23 IU/L (ref 0–40)
Albumin/Globulin Ratio: 1.6 (ref 1.2–2.2)
Albumin: 4.2 g/dL (ref 3.8–4.9)
Alkaline Phosphatase: 72 IU/L (ref 44–121)
BUN/Creatinine Ratio: 15 (ref 9–23)
BUN: 13 mg/dL (ref 6–24)
Bilirubin Total: 0.5 mg/dL (ref 0.0–1.2)
CO2: 22 mmol/L (ref 20–29)
Calcium: 9.1 mg/dL (ref 8.7–10.2)
Chloride: 101 mmol/L (ref 96–106)
Creatinine, Ser: 0.88 mg/dL (ref 0.57–1.00)
Globulin, Total: 2.7 g/dL (ref 1.5–4.5)
Glucose: 156 mg/dL — ABNORMAL HIGH (ref 70–99)
Potassium: 4.2 mmol/L (ref 3.5–5.2)
Sodium: 139 mmol/L (ref 134–144)
Total Protein: 6.9 g/dL (ref 6.0–8.5)
eGFR: 76 mL/min/{1.73_m2} (ref >59)

## 2023-02-07 LAB — MICROALBUMIN / CREATININE URINE RATIO
Creatinine, Urine: 26.6 mg/dL
Microalb/Creat Ratio: 20 mg/g creat (ref 0–29)
Microalbumin, Urine: 5.3 ug/mL

## 2023-02-07 LAB — LIPID PANEL
Chol/HDL Ratio: 4.9 ratio — ABNORMAL HIGH (ref 0.0–4.4)
Cholesterol, Total: 243 mg/dL — ABNORMAL HIGH (ref 100–199)
HDL: 50 mg/dL (ref >39)
LDL Chol Calc (NIH): 148 mg/dL — ABNORMAL HIGH (ref 0–99)
Triglycerides: 246 mg/dL — ABNORMAL HIGH (ref 0–149)
VLDL Cholesterol Cal: 45 mg/dL — ABNORMAL HIGH (ref 5–40)

## 2023-02-07 LAB — CORTISOL: Cortisol: 10.4 ug/dL (ref 6.2–19.4)

## 2023-02-07 LAB — HEMOGLOBIN A1C
Est. average glucose Bld gHb Est-mCnc: 189 mg/dL
Hgb A1c MFr Bld: 8.2 % — ABNORMAL HIGH (ref 4.8–5.6)

## 2023-02-07 LAB — TSH: TSH: 2.2 u[IU]/mL (ref 0.450–4.500)

## 2023-02-08 ENCOUNTER — Other Ambulatory Visit: Payer: Self-pay | Admitting: Internal Medicine

## 2023-02-08 ENCOUNTER — Telehealth: Payer: Self-pay

## 2023-02-08 DIAGNOSIS — E1169 Type 2 diabetes mellitus with other specified complication: Secondary | ICD-10-CM

## 2023-02-08 DIAGNOSIS — E118 Type 2 diabetes mellitus with unspecified complications: Secondary | ICD-10-CM

## 2023-02-08 MED ORDER — FENOFIBRATE 145 MG PO TABS: 145.0000 mg | ORAL_TABLET | Freq: Every day | ORAL | 0 refills | Status: DC

## 2023-02-08 MED ORDER — PRAVASTATIN SODIUM 40 MG PO TABS: 40.0000 mg | ORAL_TABLET | Freq: Every day | ORAL | 0 refills | Status: DC

## 2023-02-08 MED ORDER — TIRZEPATIDE 2.5 MG/0.5ML ~~LOC~~ SOAJ: 2.5000 mg | SUBCUTANEOUS | 0 refills | Status: DC

## 2023-02-08 MED ORDER — ROSUVASTATIN CALCIUM 5 MG PO TABS: 5.0000 mg | ORAL_TABLET | Freq: Every day | ORAL | 1 refills | Status: DC

## 2023-02-08 NOTE — Progress Notes (Signed)
Noted  KP 

## 2023-02-08 NOTE — Progress Notes (Signed)
Pt would like to start statin as well as mounjaro. She would like it sent to [facility].  KP

## 2023-02-08 NOTE — Telephone Encounter (Signed)
Patient returned call regarding her lab results that she received this morning from [place], [place] and said the line dropped. She says she would start back on the Pravastatin and Fenofibrate that she has at home, if that's ok with Dr. Judithann Graves. She says crestor causes severe body aches, joint pain, and she doesn't want to take that. She also says the lowest dose of mounjaro.  Pharmacy:  [id number] [place], [place] - [address] Phone: [phone]  Fax: [phone]

## 2023-02-08 NOTE — Telephone Encounter (Signed)
Called pt. Pt is aware and verbalized understanding.  KP

## 2023-02-08 NOTE — Telephone Encounter (Signed)
Please review.  KP

## 2023-03-05 ENCOUNTER — Other Ambulatory Visit: Payer: Self-pay | Admitting: Internal Medicine

## 2023-03-05 DIAGNOSIS — E118 Type 2 diabetes mellitus with unspecified complications: Secondary | ICD-10-CM

## 2023-03-06 ENCOUNTER — Other Ambulatory Visit: Payer: Self-pay | Admitting: Internal Medicine

## 2023-03-06 DIAGNOSIS — Z79899 Other long term (current) drug therapy: Secondary | ICD-10-CM

## 2023-03-06 DIAGNOSIS — E118 Type 2 diabetes mellitus with unspecified complications: Secondary | ICD-10-CM

## 2023-03-06 MED ORDER — TIRZEPATIDE 5 MG/0.5ML ~~LOC~~ SOAJ
5.0000 mg | SUBCUTANEOUS | 0 refills | Status: DC
Start: 2023-03-06 — End: 2023-05-29

## 2023-03-25 ENCOUNTER — Other Ambulatory Visit: Payer: Self-pay | Admitting: Internal Medicine

## 2023-03-25 DIAGNOSIS — E118 Type 2 diabetes mellitus with unspecified complications: Secondary | ICD-10-CM

## 2023-04-13 ENCOUNTER — Ambulatory Visit
Admission: EM | Admit: 2023-04-13 | Discharge: 2023-04-13 | Disposition: A | Discharge status: Home / Self Care | Payer: No Typology Code available for payment source | Attending: Emergency Medicine | Admitting: Emergency Medicine

## 2023-04-13 DIAGNOSIS — H9202 Otalgia, left ear: Secondary | ICD-10-CM | POA: Diagnosis not present

## 2023-04-13 MED ORDER — AMOXICILLIN-POT CLAVULANATE 875-125 MG PO TABS: 1.0000 | ORAL_TABLET | Freq: Two times a day (BID) | ORAL | 0 refills | Status: DC

## 2023-04-13 NOTE — Discharge Instructions (Signed)
Today you were evaluated for ear pain  On exam there are no abnormalities to your eardrum or the ear canal and I believe your symptoms are related to your sinuses as you recently had a viral illness, will start you on antibiotic for treatment of the sinus infection  Begin Augmentin every morning and every evening for 7 days  Begin use of your Nasacort to help clear out sinus congestion which is most likely contributing to your symptoms  You may use Tylenol and or Motrin every 6 hours as needed for management of pain  You may help warm compresses to your external ER as needed for comfort  Avoid any ear cleaning or object placement into the canal as this sometimes may cause further irritation  Please follow-up if your symptoms continue to persist or worsen

## 2023-04-13 NOTE — ED Provider Notes (Addendum)
[facility]    CSN: [id number] Arrival date & time: 04/13/23  1425      History   Chief Complaint Chief Complaint  Patient presents with   Otalgia    HPI Mary Macias is a 57 y.o. female.   Patient presents for evaluation of left-sided ear pain present for the last 3 to 4 days, symptoms worsen at nighttime with accompanying vertigo.  [place] from the beach which exacerbated ear pain.  Associated sensation of fullness and feels like there is fluid within the ears.  Recent viral illness within the last 2 weeks.  Has attempted use of over-the-counter eardrops which has been ineffective.  Past Medical History:  Diagnosis Date   Allergy    Complication of anesthesia    slow to wake up   Diabetes mellitus without complication (HCC)    Pneumonia    PONV (postoperative nausea and vomiting)    Sleep apnea    cpap   Stable angina pectoris 08/10/2017    Patient Active Problem List   Diagnosis Date Noted   Adult ADHD 04/28/2021   Hx of total hysterectomy 12/15/2020   Nontraumatic complete tear of right rotator cuff 12/15/2020   OSA (obstructive sleep apnea) 02/02/2020   Hepatic steatosis 11/22/2019   Depression with anxiety 08/07/2019   Peripheral edema 02/08/2018   BMI 50.0-59.9, adult (HCC) 01/25/2018   Colon polyp, hyperplastic 07/16/2017   Diabetes mellitus treated with injections of non-insulin medication (HCC) 08/18/2015   Hyperlipidemia associated with type 2 diabetes mellitus (HCC) 07/08/2015   Environmental and seasonal allergies 07/08/2015   Avitaminosis D 07/08/2015   Beat, premature ventricular 02/11/2015    Past Surgical History:  Procedure Laterality Date   CHOLECYSTECTOMY     COLONOSCOPY N/A 11/27/2022   Procedure: COLONOSCOPY;  Surgeon: Jaynie Collins, DO;  Location: [facility] ENDOSCOPY;  Service: Gastroenterology;  Laterality: N/A;   COLONOSCOPY WITH PROPOFOL N/A 07/13/2017   Procedure: COLONOSCOPY WITH PROPOFOL;  Surgeon:  Christena Deem, MD;  Location: [facility] ENDOSCOPY;  Service: Endoscopy;  Laterality: N/A;   dental implants Right    NASAL SINUS SURGERY     OOPHORECTOMY     SHOULDER ARTHROSCOPY WITH ROTATOR CUFF REPAIR AND SUBACROMIAL DECOMPRESSION Right 11/22/2020   Procedure: Right shoulder arthroscopic rotator cuff repair, subacromial decompression, and biceps tenodesis ,distal clavicle excision;  Surgeon: Signa Kell, MD;  Location: [facility] ORS;  Service: Orthopedics;  Laterality: Right;   TOTAL VAGINAL HYSTERECTOMY     TUBAL LIGATION      OB History   No obstetric history on file.      Home Medications    Prior to Admission medications   Medication Sig Start Date End Date Taking? Authorizing Provider  b complex vitamins capsule Take 1 capsule by mouth daily.   Yes [provider]  BIOTIN PO Take by mouth.   Yes [provider]  Cholecalciferol (DIALYVITE VITAMIN D 5000) 125 MCG (5000 UT) capsule Take 5,000 Units by mouth 3 (three) times a week.   Yes [provider]  Continuous Blood Gluc Sensor (FREESTYLE LIBRE 14 DAY SENSOR) MISC 1 each by Does not apply route every 14 (fourteen) days. 04/27/22  Yes Reubin Milan, MD  desvenlafaxine (PRISTIQ) 50 MG 24 hr tablet Take 50 mg by mouth daily. 01/16/23  Yes [provider]  famotidine-calcium carbonate-magnesium hydroxide (PEPCID COMPLETE) 10-800-165 MG chewable tablet Chew 1 tablet by mouth daily as needed (indigestion).   Yes [provider]  FARXIGA 10 MG  TABS tablet TAKE 1 TABLET BY MOUTH EVERY DAY BEFORE BREAKFAST 03/26/23  Yes Reubin Milan, MD  fenofibrate (TRICOR) 145 MG tablet Take 1 tablet (145 mg total) by mouth daily. 02/08/23  Yes Reubin Milan, MD  fexofenadine (ALLEGRA) 180 MG tablet Take by mouth.   Yes [provider]  MAGNESIUM PO Take by mouth daily.   Yes [provider]  NON FORMULARY at bedtime. Auto Cpap   Yes [provider]  nystatin cream  (MYCOSTATIN) Apply 1 application topically 2 (two) times daily. 04/28/21  Yes Reubin Milan, MD  Polyethylene Glycol 3350 (MIRALAX PO) Take by mouth as needed.   Yes [provider]  pravastatin (PRAVACHOL) 40 MG tablet Take 1 tablet (40 mg total) by mouth daily. 02/08/23  Yes Reubin Milan, MD  Probiotic Product (PROBIOTIC PO) Take by mouth daily.   Yes [provider]  tirzepatide Greggory Keen) 5 MG/0.5ML Pen Inject 5 mg into the skin once a week. 03/06/23  Yes Reubin Milan, MD  acyclovir ointment (ZOVIRAX) 5 % APPLY 1 APPLICATION TOPICALLY DAILY AS NEEDED (COLD SORES). 12/27/21   Reubin Milan, MD  cyclobenzaprine (FLEXERIL) 5 MG tablet Take 1 tablet (5 mg total) by mouth 3 (three) times daily as needed. 10/09/22   Menshew, Charlesetta Ivory, PA-C  EPINEPHrine 0.3 mg/0.3 mL IJ SOAJ injection Inject 0.3 mg into the muscle as needed for anaphylaxis. 04/27/22   Reubin Milan, MD  escitalopram (LEXAPRO) 10 MG tablet TAKE 1 TABLET BY MOUTH EVERY DAY Patient taking differently: Take 10 mg by mouth daily. 07/17/21   Reubin Milan, MD  fluconazole (DIFLUCAN) 100 MG tablet TAKE 1 TABLET BY MOUTH DAILY FOR 7 DAYS 12/25/22   Reubin Milan, MD  valACYclovir (VALTREX) 1000 MG tablet TAKE 1 TABLET (1,000 MG TOTAL) BY MOUTH AS NEEDED (COLD SORES). 10/25/21   Reubin Milan, MD    Family History Family History  Problem Relation Age of Onset   Breast cancer Paternal Grandmother    Colon cancer Mother    Hypertension Mother    Diabetes Father    Hypertension Father    CAD Father    Hypertension Sister     Social History Social History   Tobacco Use   Smoking status: Never   Smokeless tobacco: Never  Vaping Use   Vaping Use: Never used  Substance Use Topics   Alcohol use: No    Alcohol/week: 0.0 standard drinks of alcohol   Drug use: No     Allergies   Peanut-containing drug products, Povidone-iodine, Atorvastatin, Meloxicam, Povidone iodine, Propoxyphene,  Iodine, Mounjaro [tirzepatide], Cephalexin, Metformin and related, and Trulicity [dulaglutide]   Review of Systems Review of Systems  HENT:  Positive for ear pain.      Physical Exam Triage Vital Signs ED Triage Vitals [04/13/23 1526]  Enc Vitals Group     BP (!) 134/90     Pulse Rate 81     Resp 18     Temp 98.6 F (37 C)     Temp Source Oral     SpO2 94 %     Weight      Height      Head Circumference      Peak Flow      Pain Score 4     Pain Loc      Pain Edu?      Excl. in GC?    No data found.  Updated Vital Signs  BP (!) 134/90 (BP Location: Right Arm)   Pulse 81   Temp 98.6 F (37 C) (Oral)   Resp 18   SpO2 94%   Visual Acuity Right Eye Distance:   Left Eye Distance:   Bilateral Distance:    Right Eye Near:   Left Eye Near:    Bilateral Near:     Physical Exam Constitutional:      Appearance: Normal appearance.  HENT:     Right Ear: Tympanic membrane, ear canal and external ear normal.     Left Ear: Tympanic membrane, ear canal and external ear normal.  Eyes:     Extraocular Movements: Extraocular movements intact.  Pulmonary:     Effort: Pulmonary effort is normal.  Neurological:     Mental Status: She is alert and oriented to person, place, and time. Mental status is at baseline.      UC Treatments / Results  Labs (all labs ordered are listed, but only abnormal results are displayed) Labs Reviewed - No data to display  EKG   Radiology No results found.  Procedures Procedures (including critical care time)  Medications Ordered in UC Medications - No data to display  Initial Impression / Assessment and Plan / UC Course  I have reviewed the triage vital signs and the nursing notes.  Pertinent labs & imaging results that were available during my care of the patient were reviewed by me and considered in my medical decision making (see chart for details).  Otalgia of the left ear  On on exam abnormalities to the canal or  tympanic membrane, discussed with patient, most likely related to the sinuses, and even though ear pain has been only present for a few days recent viral illness was 2 weeks ago therefore will provide coverage for sinusitis, prescribed Augmentin and advised patient to begin use of her Nasacort, may use over-the-counter analgesics and warm compresses to the external ear for management of pain and advised against any ear cleaning, advised follow-up for any persisting or worsening symptoms Final Clinical Impressions(s) / UC Diagnoses   Final diagnoses:  None   Discharge Instructions   None    ED Prescriptions   None    PDMP not reviewed this encounter.   Valinda Hoar, NP 04/13/23 1545    Valinda Hoar, NP 04/13/23 1550

## 2023-04-13 NOTE — ED Triage Notes (Signed)
Pt presents with left ear pain that started 2 weeks ago. Pt states she had a cold 2 week sago now feels like she has fluid in her ears and dizziness when laying down.

## 2023-04-24 ENCOUNTER — Encounter: Payer: Self-pay | Admitting: Internal Medicine

## 2023-04-24 ENCOUNTER — Ambulatory Visit (INDEPENDENT_AMBULATORY_CARE_PROVIDER_SITE_OTHER): Payer: 59 | Admitting: Internal Medicine

## 2023-04-24 VITALS — BP 126/78 | HR 67 | Ht 66.0 in | Wt 297.0 lb

## 2023-04-24 DIAGNOSIS — Z79899 Other long term (current) drug therapy: Secondary | ICD-10-CM

## 2023-04-24 DIAGNOSIS — H6993 Unspecified Eustachian tube disorder, bilateral: Secondary | ICD-10-CM

## 2023-04-24 DIAGNOSIS — E785 Hyperlipidemia, unspecified: Secondary | ICD-10-CM

## 2023-04-24 DIAGNOSIS — E119 Type 2 diabetes mellitus without complications: Secondary | ICD-10-CM

## 2023-04-24 DIAGNOSIS — F418 Other specified anxiety disorders: Secondary | ICD-10-CM

## 2023-04-24 DIAGNOSIS — E1169 Type 2 diabetes mellitus with other specified complication: Secondary | ICD-10-CM | POA: Diagnosis not present

## 2023-04-24 NOTE — Assessment & Plan Note (Addendum)
Resumed pravachol and fenofibrate after last visit. Only able to take pravachol once a week. Lab Results  Component Value Date   LDLCALC 148 (H) 02/06/2023

## 2023-04-24 NOTE — Assessment & Plan Note (Addendum)
A1c much higher last visit after stopping Mounjaro. Greggory Keen has been resumed and she continues [place]. Some stomach upset but likely related to stress with upcoming move. Lab Results  Component Value Date   HGBA1C 8.2 (H) 02/06/2023  Will obtain A1c today

## 2023-04-24 NOTE — Progress Notes (Signed)
Date:  04/24/2023   Name:  Mary Macias   DOB:  1966-09-19   MRN:  [id number]   Chief Complaint: Diabetes  Diabetes She presents for her follow-up diabetic visit. She has type 2 diabetes mellitus. Her disease course has been improving. Pertinent negatives for hypoglycemia include no headaches or tremors. Pertinent negatives for diabetes include no chest pain, no fatigue, no polydipsia and no polyuria. Current diabetic treatments: mounjaro and [place].  Hyperlipidemia This is a chronic problem. The problem is uncontrolled. Pertinent negatives include no chest pain or shortness of breath. Current antihyperlipidemic treatment includes statins and fibric acid derivatives (pravachol now resumed;).  Otalgia  There is pain in both ears. This is a new problem. The current episode started 1 to 4 weeks ago. The problem occurs constantly. The problem has been unchanged. There has been no fever. The pain is mild. Pertinent negatives include no abdominal pain, coughing or headaches. Treatments tried: completed antibiotic course for sinusitis. The treatment provided mild relief.    Lab Results  Component Value Date   NA 139 02/06/2023   K 4.2 02/06/2023   CO2 22 02/06/2023   GLUCOSE 156 (H) 02/06/2023   BUN 13 02/06/2023   CREATININE 0.88 02/06/2023   CALCIUM 9.1 02/06/2023   EGFR 76 02/06/2023   GFRNONAA 78 09/29/2020   Lab Results  Component Value Date   CHOL 243 (H) 02/06/2023   HDL 50 02/06/2023   LDLCALC 148 (H) 02/06/2023   TRIG 246 (H) 02/06/2023   CHOLHDL 4.9 (H) 02/06/2023   Lab Results  Component Value Date   TSH 2.200 02/06/2023   Lab Results  Component Value Date   HGBA1C 8.2 (H) 02/06/2023   Lab Results  Component Value Date   WBC 7.1 02/06/2023   HGB 14.7 02/06/2023   HCT 45.6 02/06/2023   MCV 94 02/06/2023   PLT 231 02/06/2023   Lab Results  Component Value Date   ALT 27 02/06/2023   AST 23 02/06/2023   ALKPHOS 72 02/06/2023   BILITOT 0.5 02/06/2023    Lab Results  Component Value Date   VD25OH 41.0 12/20/2021     Review of Systems  Constitutional:  Negative for appetite change, fatigue, fever and unexpected weight change.  HENT:  Positive for congestion and ear pain. Negative for sinus pressure, tinnitus and trouble swallowing.   Eyes:  Negative for visual disturbance.  Respiratory:  Negative for cough, chest tightness and shortness of breath.   Cardiovascular:  Negative for chest pain, palpitations and leg swelling.  Gastrointestinal:  Negative for abdominal pain.  Endocrine: Negative for polydipsia and polyuria.  Genitourinary:  Negative for dysuria and hematuria.  Musculoskeletal:  Negative for arthralgias.  Neurological:  Negative for tremors, numbness and headaches.  Psychiatric/Behavioral:  Negative for dysphoric mood.     Patient Active Problem List   Diagnosis Date Noted   Adult ADHD 04/28/2021   Hx of total hysterectomy 12/15/2020   Nontraumatic complete tear of right rotator cuff 12/15/2020   OSA (obstructive sleep apnea) 02/02/2020   Hepatic steatosis 11/22/2019   Depression with anxiety 08/07/2019   Peripheral edema 02/08/2018   BMI 50.0-59.9, adult (HCC) 01/25/2018   Colon polyp, hyperplastic 07/16/2017   Diabetes mellitus treated with injections of non-insulin medication (HCC) 08/18/2015   Hyperlipidemia associated with type 2 diabetes mellitus (HCC) 07/08/2015   Environmental and seasonal allergies 07/08/2015   Avitaminosis D 07/08/2015   Beat, premature ventricular 02/11/2015    Allergies  Allergen Reactions  Peanut-Containing Drug Products Hives and Swelling    Uses EPI-PEN As Needed for this.   Povidone-Iodine Other (See Comments) and Rash    Other Reaction: Other reaction Other Reaction: Other reaction   Atorvastatin Other (See Comments)    Severe body aches   Meloxicam Other (See Comments)    Kidney function reduced  Kidney function reduced   Kidney function reduced   Povidone Iodine Rash    Propoxyphene Rash    Uncoded Allergy. Allergen: Citrus fruits   Iodine    Mounjaro [Tirzepatide] Other (See Comments)    Flu like symptoms    Cephalexin Rash and Other (See Comments)   Metformin And Related Diarrhea   Trulicity [Dulaglutide]     Abdominal discomfort, flatus    Past Surgical History:  Procedure Laterality Date   CHOLECYSTECTOMY     COLONOSCOPY N/A 11/27/2022   Procedure: COLONOSCOPY;  Surgeon: Jaynie Collins, DO;  Location: [facility] ENDOSCOPY;  Service: Gastroenterology;  Laterality: N/A;   COLONOSCOPY WITH PROPOFOL N/A 07/13/2017   Procedure: COLONOSCOPY WITH PROPOFOL;  Surgeon: Christena Deem, MD;  Location: [facility] ENDOSCOPY;  Service: Endoscopy;  Laterality: N/A;   dental implants Right    NASAL SINUS SURGERY     OOPHORECTOMY     SHOULDER ARTHROSCOPY WITH ROTATOR CUFF REPAIR AND SUBACROMIAL DECOMPRESSION Right 11/22/2020   Procedure: Right shoulder arthroscopic rotator cuff repair, subacromial decompression, and biceps tenodesis ,distal clavicle excision;  Surgeon: Signa Kell, MD;  Location: [facility] ORS;  Service: Orthopedics;  Laterality: Right;   TOTAL VAGINAL HYSTERECTOMY     TUBAL LIGATION      Social History   Tobacco Use   Smoking status: Never   Smokeless tobacco: Never  Vaping Use   Vaping status: Never Used  Substance Use Topics   Alcohol use: No    Alcohol/week: 0.0 standard drinks of alcohol   Drug use: No     Medication list has been reviewed and updated.  Current Meds  Medication Sig   acyclovir ointment (ZOVIRAX) 5 % APPLY 1 APPLICATION TOPICALLY DAILY AS NEEDED (COLD SORES).   b complex vitamins capsule Take 1 capsule by mouth daily.   BIOTIN PO Take by mouth.   Cholecalciferol (DIALYVITE VITAMIN D 5000) 125 MCG (5000 UT) capsule Take 5,000 Units by mouth 3 (three) times a week.   Continuous Blood Gluc Sensor (FREESTYLE LIBRE 14 DAY SENSOR) MISC 1 each by Does not apply route every 14 (fourteen) days.   cyclobenzaprine  (FLEXERIL) 5 MG tablet Take 1 tablet (5 mg total) by mouth 3 (three) times daily as needed.   desvenlafaxine (PRISTIQ) 50 MG 24 hr tablet Take 50 mg by mouth daily.   EPINEPHrine 0.3 mg/0.3 mL IJ SOAJ injection Inject 0.3 mg into the muscle as needed for anaphylaxis.   famotidine-calcium carbonate-magnesium hydroxide (PEPCID COMPLETE) 10-800-165 MG chewable tablet Chew 1 tablet by mouth daily as needed (indigestion).   FARXIGA 10 MG TABS tablet TAKE 1 TABLET BY MOUTH EVERY DAY BEFORE BREAKFAST   fenofibrate (TRICOR) 145 MG tablet Take 1 tablet (145 mg total) by mouth daily.   fexofenadine (ALLEGRA) 180 MG tablet Take by mouth.   fluconazole (DIFLUCAN) 100 MG tablet TAKE 1 TABLET BY MOUTH DAILY FOR 7 DAYS   MAGNESIUM PO Take by mouth daily.   NON FORMULARY at bedtime. Auto Cpap   nystatin cream (MYCOSTATIN) Apply 1 application topically 2 (two) times daily.   Polyethylene Glycol 3350 (MIRALAX PO) Take by mouth as needed.   pravastatin (PRAVACHOL)  40 MG tablet Take 1 tablet (40 mg total) by mouth daily. (Patient taking differently: Take 40 mg by mouth once a week.)   Probiotic Product (PROBIOTIC PO) Take by mouth daily.   tirzepatide [facility]) 5 MG/0.5ML Pen Inject 5 mg into the skin once a week.   valACYclovir (VALTREX) 1000 MG tablet TAKE 1 TABLET (1,000 MG TOTAL) BY MOUTH AS NEEDED (COLD SORES).       04/24/2023    4:46 PM 12/22/2022    4:00 PM 08/09/2022    9:35 AM 04/27/2022   10:35 AM  GAD 7 : Generalized Anxiety Score  Nervous, Anxious, on Edge 0 0 1 1  Control/stop worrying 0 0 1 2  Worry too much - different things 0 0 1 2  Trouble relaxing 1 0 1 1  Restless 1 0 1 1  Easily annoyed or irritable 1  1 2   Afraid - awful might happen 0 0 1 0  Total GAD 7 Score 3  7 9   Anxiety Difficulty Somewhat difficult Not difficult at all Somewhat difficult Very difficult       04/24/2023    4:45 PM 12/22/2022    4:00 PM 08/09/2022    9:35 AM  Depression screen PHQ 2/9  Decreased Interest 0  0 2  Down, Depressed, Hopeless 1 0 1  PHQ - 2 Score 1 0 3  Altered sleeping 1 0 1  Tired, decreased energy 1 0 3  Change in appetite 2 0 2  Feeling bad or failure about yourself  0 0 0  Trouble concentrating 1 2 3   Moving slowly or fidgety/restless 1 0 3  Suicidal thoughts 0 0 0  PHQ-9 Score 7 2 15   Difficult doing work/chores Somewhat difficult Not difficult at all Very difficult    BP Readings from Last 3 Encounters:  04/24/23 126/78  04/13/23 (!) 134/90  12/22/22 (!) 140/90    Physical Exam Vitals and nursing note reviewed.  Constitutional:      General: She is not in acute distress.    Appearance: She is well-developed.  HENT:     Head: Normocephalic and atraumatic.     Right Ear: No middle ear effusion. Tympanic membrane is retracted. Tympanic membrane is not erythematous.     Left Ear:  No middle ear effusion. Tympanic membrane is retracted. Tympanic membrane is not erythematous.     Nose:     Right Sinus: No maxillary sinus tenderness.     Left Sinus: No maxillary sinus tenderness.     Mouth/Throat:     Pharynx: Oropharynx is clear.  Cardiovascular:     Rate and Rhythm: Normal rate and regular rhythm.  Pulmonary:     Effort: Pulmonary effort is normal. No respiratory distress.     Breath sounds: No wheezing or rhonchi.  Musculoskeletal:     Cervical back: Normal range of motion.  Lymphadenopathy:     Cervical: No cervical adenopathy.  Skin:    General: Skin is warm and dry.     Findings: No rash.  Neurological:     Mental Status: She is alert and oriented to person, place, and time.  Psychiatric:        Mood and Affect: Mood normal.        Behavior: Behavior normal.     Wt Readings from Last 3 Encounters:  04/24/23 297 lb (134.7 kg)  12/22/22 288 lb (130.6 kg)  11/27/22 286 lb (129.7 kg)    BP 126/78   Pulse 67  Ht 5\' 6"  (1.676 m)   Wt 297 lb (134.7 kg)   SpO2 95%   BMI 47.94 kg/m   Assessment and Plan:  Problem List Items Addressed This  Visit     Hyperlipidemia associated with type 2 diabetes mellitus (HCC) - Primary (Chronic)    Resumed pravachol and fenofibrate after last visit. Only able to take pravachol once a week. Lab Results  Component Value Date   LDLCALC 148 (H) 02/06/2023        Relevant Orders   Comprehensive metabolic panel   Diabetes mellitus treated with injections of non-insulin medication (HCC)    A1c much higher last visit after stopping Mounjaro. Greggory Keen has been resumed and she continues [place]. Some stomach upset but likely related to stress with upcoming move. Lab Results  Component Value Date   HGBA1C 8.2 (H) 02/06/2023  Will obtain A1c today        Relevant Orders   Hemoglobin A1c   Depression with anxiety (Chronic)    Followed by [facility] off of Strattera and Lexapro On Pristiq and improving      Other Visit Diagnoses     Dysfunction of both eustachian tubes       continue steroid nasal spray add Coricidin HBP See ENT as planned       Return in about 4 months (around 08/25/2023) for DM, HTN.    Reubin Milan, MD [facility] [place]

## 2023-04-24 NOTE — Assessment & Plan Note (Signed)
Followed by [facility] off of Strattera and Lexapro On Pristiq and improving

## 2023-04-24 NOTE — Patient Instructions (Addendum)
Coricidin HBP take as directed for ear congestion.  Continue steroid nasal spray.

## 2023-04-25 LAB — COMPREHENSIVE METABOLIC PANEL
ALT: 34 IU/L — ABNORMAL HIGH (ref 0–32)
AST: 39 IU/L (ref 0–40)
Albumin: 4.4 g/dL (ref 3.8–4.9)
Alkaline Phosphatase: 58 IU/L (ref 44–121)
BUN/Creatinine Ratio: 14 (ref 9–23)
BUN: 13 mg/dL (ref 6–24)
Bilirubin Total: 0.3 mg/dL (ref 0.0–1.2)
CO2: 26 mmol/L (ref 20–29)
Calcium: 9.1 mg/dL (ref 8.7–10.2)
Chloride: 100 mmol/L (ref 96–106)
Creatinine, Ser: 0.91 mg/dL (ref 0.57–1.00)
Globulin, Total: 2.4 g/dL (ref 1.5–4.5)
Glucose: 114 mg/dL — ABNORMAL HIGH (ref 70–99)
Potassium: 4.2 mmol/L (ref 3.5–5.2)
Sodium: 138 mmol/L (ref 134–144)
Total Protein: 6.8 g/dL (ref 6.0–8.5)
eGFR: 73 mL/min/{1.73_m2} (ref >59)

## 2023-04-25 LAB — HEMOGLOBIN A1C
Est. average glucose Bld gHb Est-mCnc: 140 mg/dL
Hgb A1c MFr Bld: 6.5 % — ABNORMAL HIGH (ref 4.8–5.6)

## 2023-05-06 ENCOUNTER — Other Ambulatory Visit: Payer: Self-pay | Admitting: Internal Medicine

## 2023-05-06 DIAGNOSIS — E1169 Type 2 diabetes mellitus with other specified complication: Secondary | ICD-10-CM

## 2023-05-29 ENCOUNTER — Other Ambulatory Visit: Payer: Self-pay | Admitting: Internal Medicine

## 2023-05-29 DIAGNOSIS — Z79899 Other long term (current) drug therapy: Secondary | ICD-10-CM

## 2023-05-29 NOTE — Telephone Encounter (Signed)
Requested medication (s) are due for refill today - yes  Requested medication (s) are on the active medication list -yes  Future visit scheduled -yes  Last refill: 03/06/23 6ml  Notes to clinic: off protocol- provider review   Requested Prescriptions  Pending Prescriptions Disp Refills   MOUNJARO 5 MG/0.5ML Pen [Pharmacy Med Name: MOUNJARO 5 MG/0.5 ML PEN]      Sig: INJECT 5 MG SUBCUTANEOUSLY WEEKLY     Off-Protocol Failed - 05/29/2023  1:25 AM      Failed - Medication not assigned to a protocol, review manually.      Passed - Valid encounter within last 12 months    Recent Outpatient Visits           1 month ago Hyperlipidemia associated with type 2 diabetes mellitus (HCC)   [place] [facility] at [facility], Nyoka Cowden, MD   5 months ago Annual physical exam   [facility] at [facility], Nyoka Cowden, MD   8 months ago Type II diabetes mellitus with complication [facility])   [place] [facility] at [facility], Nyoka Cowden, MD   9 months ago Type II diabetes mellitus with complication [facility])   [place] [facility] at [facility], Nyoka Cowden, MD   1 year ago Type II diabetes mellitus with complication [facility])   [place] [facility] at [facility], Nyoka Cowden, MD       Future Appointments             In 3 months Judithann Graves Nyoka Cowden, MD [facility] at [facility], [facility]               Requested Prescriptions  Pending Prescriptions Disp Refills   MOUNJARO 5 MG/0.5ML Pen [Pharmacy Med Name: MOUNJARO 5 MG/0.5 ML PEN]      Sig: INJECT 5 MG SUBCUTANEOUSLY WEEKLY     Off-Protocol Failed - 05/29/2023  1:25 AM      Failed - Medication not assigned to a protocol, review manually.      Passed - Valid encounter within last 12 months    Recent Outpatient Visits           1 month  ago Hyperlipidemia associated with type 2 diabetes mellitus (HCC)   [place] [facility] at [facility], Nyoka Cowden, MD   5 months ago Annual physical exam   [facility] at [facility], Nyoka Cowden, MD   8 months ago Type II diabetes mellitus with complication [facility])   [place] [facility] at [facility], Nyoka Cowden, MD   9 months ago Type II diabetes mellitus with complication [facility])   [place] [facility] at [facility], Nyoka Cowden, MD   1 year ago Type II diabetes mellitus with complication [facility])   [place] [facility] at [facility], Nyoka Cowden, MD       Future Appointments             In 3 months Judithann Graves, Nyoka Cowden, MD [facility] Medicine at [facility], [facility]

## 2023-06-01 ENCOUNTER — Telehealth: Payer: 59 | Admitting: Family Medicine

## 2023-06-01 ENCOUNTER — Telehealth: Payer: Self-pay | Admitting: Internal Medicine

## 2023-06-01 ENCOUNTER — Encounter: Payer: Self-pay | Admitting: Family Medicine

## 2023-06-01 DIAGNOSIS — A09 Infectious gastroenteritis and colitis, unspecified: Secondary | ICD-10-CM | POA: Insufficient documentation

## 2023-06-01 MED ORDER — AZITHROMYCIN 500 MG PO TABS
500.0000 mg | ORAL_TABLET | Freq: Every day | ORAL | 0 refills | Status: AC
Start: 2023-06-01 — End: 2023-06-04

## 2023-06-01 NOTE — Progress Notes (Signed)
Primary Care / Sports Medicine Virtual Visit  Patient Information:  Patient ID: Mary Macias, female DOB: Jul 03, 1966 Age: 57 y.o. MRN: [id number]   Mary Macias is a pleasant 57 y.o. female presenting with the following:  Chief Complaint  Patient presents with   Diarrhea    Since monday    Review of Systems: No fevers, chills, night sweats, weight loss, chest pain, or shortness of breath.   Patient Active Problem List   Diagnosis Date Noted   Infectious diarrhea 06/01/2023   Adult ADHD 04/28/2021   Hx of total hysterectomy 12/15/2020   Nontraumatic complete tear of right rotator cuff 12/15/2020   OSA (obstructive sleep apnea) 02/02/2020   Hepatic steatosis 11/22/2019   Depression with anxiety 08/07/2019   Peripheral edema 02/08/2018   BMI 50.0-59.9, adult (HCC) 01/25/2018   Colon polyp, hyperplastic 07/16/2017   Diabetes mellitus treated with injections of non-insulin medication (HCC) 08/18/2015   Hyperlipidemia associated with type 2 diabetes mellitus (HCC) 07/08/2015   Environmental and seasonal allergies 07/08/2015   Avitaminosis D 07/08/2015   Beat, premature ventricular 02/11/2015   Past Medical History:  Diagnosis Date   Allergy    Complication of anesthesia    slow to wake up   Diabetes mellitus without complication (HCC)    Pneumonia    PONV (postoperative nausea and vomiting)    Sleep apnea    cpap   Stable angina pectoris 08/10/2017   Outpatient Encounter Medications as of 06/01/2023  Medication Sig   acyclovir ointment (ZOVIRAX) 5 % APPLY 1 APPLICATION TOPICALLY DAILY AS NEEDED (COLD SORES).   azithromycin (ZITHROMAX) 500 MG tablet Take 1 tablet (500 mg total) by mouth daily for 3 days.   b complex vitamins capsule Take 1 capsule by mouth daily.   BIOTIN PO Take by mouth.   Cholecalciferol (DIALYVITE VITAMIN D 5000) 125 MCG (5000 UT) capsule Take 5,000 Units by mouth 3 (three) times a week.   Continuous Blood Gluc Sensor (FREESTYLE LIBRE  14 DAY SENSOR) MISC 1 each by Does not apply route every 14 (fourteen) days.   cyclobenzaprine (FLEXERIL) 5 MG tablet Take 1 tablet (5 mg total) by mouth 3 (three) times daily as needed.   desvenlafaxine (PRISTIQ) 50 MG 24 hr tablet Take 50 mg by mouth daily.   EPINEPHrine 0.3 mg/0.3 mL IJ SOAJ injection Inject 0.3 mg into the muscle as needed for anaphylaxis.   famotidine-calcium carbonate-magnesium hydroxide (PEPCID COMPLETE) 10-800-165 MG chewable tablet Chew 1 tablet by mouth daily as needed (indigestion).   FARXIGA 10 MG TABS tablet TAKE 1 TABLET BY MOUTH EVERY DAY BEFORE BREAKFAST   fenofibrate (TRICOR) 145 MG tablet Take 1 tablet (145 mg total) by mouth daily.   fexofenadine (ALLEGRA) 180 MG tablet Take by mouth.   fluconazole (DIFLUCAN) 100 MG tablet TAKE 1 TABLET BY MOUTH DAILY FOR 7 DAYS   MAGNESIUM PO Take by mouth daily.   NON FORMULARY at bedtime. Auto Cpap   nystatin cream (MYCOSTATIN) Apply 1 application topically 2 (two) times daily.   Polyethylene Glycol 3350 (MIRALAX PO) Take by mouth as needed.   pravastatin (PRAVACHOL) 40 MG tablet Take 1 tablet (40 mg total) by mouth once a week.   Probiotic Product (PROBIOTIC PO) Take by mouth daily.   tirzepatide (MOUNJARO) 5 MG/0.5ML Pen INJECT 5 MG SUBCUTANEOUSLY WEEKLY   valACYclovir (VALTREX) 1000 MG tablet TAKE 1 TABLET (1,000 MG TOTAL) BY MOUTH AS NEEDED (COLD SORES).   No facility-administered encounter medications on  file as of 06/01/2023.   Past Surgical History:  Procedure Laterality Date   CHOLECYSTECTOMY     COLONOSCOPY N/A 11/27/2022   Procedure: COLONOSCOPY;  Surgeon: Jaynie Collins, DO;  Location: [facility] ENDOSCOPY;  Service: Gastroenterology;  Laterality: N/A;   COLONOSCOPY WITH PROPOFOL N/A 07/13/2017   Procedure: COLONOSCOPY WITH PROPOFOL;  Surgeon: Christena Deem, MD;  Location: [facility] ENDOSCOPY;  Service: Endoscopy;  Laterality: N/A;   dental implants Right    NASAL SINUS SURGERY     OOPHORECTOMY      SHOULDER ARTHROSCOPY WITH ROTATOR CUFF REPAIR AND SUBACROMIAL DECOMPRESSION Right 11/22/2020   Procedure: Right shoulder arthroscopic rotator cuff repair, subacromial decompression, and biceps tenodesis ,distal clavicle excision;  Surgeon: Signa Kell, MD;  Location: [facility] ORS;  Service: Orthopedics;  Laterality: Right;   TOTAL VAGINAL HYSTERECTOMY     TUBAL LIGATION      Virtual Visit via MyChart Video:   I connected with Mary Macias on 06/01/23 via MyChart Video and verified that I am speaking with the correct person using appropriate identifiers.   The limitations, risks, security and privacy concerns of performing an evaluation and management service by MyChart Video, including the higher likelihood of inaccurate diagnoses and treatments, and the availability of in person appointments were reviewed. The possible need of an additional face-to-face encounter for complete and high quality delivery of care was discussed. The patient was also made aware that there may be a patient responsible charge related to this service. The patient expressed understanding and wishes to proceed.  Provider location is in medical facility. Patient location is at their home, different from provider location. People involved in care of the patient during this telehealth encounter were myself, my nurse/medical assistant, and my front office/scheduling team member.  Objective findings:   General: Speaking full sentences, no audible heavy breathing. Sounds alert and appropriately interactive. Well-appearing. Face symmetric. Extraocular movements intact. Pupils equal and round. No nasal flaring or accessory muscle use visualized.  Independent interpretation of notes and tests performed by another provider:   None  Pertinent History, Exam, Impression, and Recommendations:   Infectious diarrhea 5 day history of profuse, watery diarrhea. Denies blood in stool, no nausea, no emesis, appetite okay. Noting  post-meal cramping. Of note, was at hospital visiting family prior to onset, no other family members sick. Has been using Pepto.  Clinical course most consistent with infectious diarrhea.  - Empirically treat with azithromycin 500 mg x 3 days - Supportive care - Return instructions reviewed  Orders & Medications Meds ordered this encounter  Medications   azithromycin (ZITHROMAX) 500 MG tablet    Sig: Take 1 tablet (500 mg total) by mouth daily for 3 days.    Dispense:  3 tablet    Refill:  0   No orders of the defined types were placed in this encounter.    I discussed the above assessment and treatment plan with the patient. The patient was provided an opportunity to ask questions and all were answered. The patient agreed with the plan and demonstrated an understanding of the instructions.   The patient was advised to call back or seek an in-person evaluation if the symptoms worsen or if the condition fails to improve as anticipated.   I provided a total time of 20 minutes including both face-to-face and non-face-to-face time on 06/01/2023 inclusive of time utilized for medical chart review, information gathering, care coordination with staff, and documentation completion.    Jerrol Banana, MD, Marrianne Mood  Primary Care Sports Medicine Primary Care and Sports Medicine at [facility]

## 2023-06-01 NOTE — Telephone Encounter (Signed)
Copied from CRM [id number]. Topic: General - Other >> Jun 01, 2023 12:26 PM Everette C wrote: Reason for CRM: The patient has called to request direct contact with C. McAdoo when possible  Please contact further when available

## 2023-06-01 NOTE — Assessment & Plan Note (Signed)
5 day history of profuse, watery diarrhea. Denies blood in stool, no nausea, no emesis, appetite okay. Noting post-meal cramping. Of note, was at hospital visiting family prior to onset, no other family members sick. Has been using Pepto.  Clinical course most consistent with infectious diarrhea.  - Empirically treat with azithromycin 500 mg x 3 days - Supportive care - Return instructions reviewed

## 2023-06-01 NOTE — Telephone Encounter (Signed)
Spoke with pt scheduled with Dr Ashley Royalty.

## 2023-06-01 NOTE — Patient Instructions (Signed)
-   Take antibiotics daily x 3 days - Review information attached and make changes where applicable - Stay on top of hydration and eat a bland diet until symptoms resolve - Consider probiotic supplement - Avoid antidiarrheals - Contact us for any worsening or lack of progress after the above - Follow-up as-needed

## 2023-06-21 ENCOUNTER — Other Ambulatory Visit: Payer: Self-pay | Admitting: Internal Medicine

## 2023-06-21 DIAGNOSIS — E118 Type 2 diabetes mellitus with unspecified complications: Secondary | ICD-10-CM

## 2023-07-02 ENCOUNTER — Ambulatory Visit
Admission: RE | Admit: 2023-07-02 | Discharge: 2023-07-02 | Disposition: A | Discharge status: Home / Self Care | Payer: No Typology Code available for payment source | Source: Ambulatory Visit | Attending: Internal Medicine | Admitting: Internal Medicine

## 2023-07-02 DIAGNOSIS — Z1231 Encounter for screening mammogram for malignant neoplasm of breast: Secondary | ICD-10-CM | POA: Insufficient documentation

## 2023-07-20 ENCOUNTER — Encounter: Payer: Self-pay | Admitting: Family Medicine

## 2023-07-20 ENCOUNTER — Telehealth: Payer: 59 | Admitting: Family Medicine

## 2023-07-20 VITALS — Ht 66.0 in

## 2023-07-20 DIAGNOSIS — J0140 Acute pansinusitis, unspecified: Secondary | ICD-10-CM | POA: Insufficient documentation

## 2023-07-20 MED ORDER — AZITHROMYCIN 250 MG PO TABS: ORAL_TABLET | ORAL | 0 refills | Status: AC

## 2023-07-20 NOTE — Progress Notes (Signed)
Primary Care / Sports Medicine Virtual Visit  Patient Information:  Patient ID: Mary Macias, female DOB: 06/21/1966 Age: 57 y.o. MRN: [id number]   Mary Macias is a pleasant 57 y.o. female presenting with the following:  Chief Complaint  Patient presents with   Sinusitis    X 1.5 weeks, sore throat, runny nose green/ bloody mucous, no fever, no headaches, sinus pressure, tried cough drops and Nasacort, its helping a little bit      Review of Systems: No fevers, chills, night sweats, weight loss, chest pain, or shortness of breath.   Patient Active Problem List   Diagnosis Date Noted   Acute pansinusitis 07/20/2023   Infectious diarrhea 06/01/2023   Adult ADHD 04/28/2021   Hx of total hysterectomy 12/15/2020   Nontraumatic complete tear of right rotator cuff 12/15/2020   OSA (obstructive sleep apnea) 02/02/2020   Hepatic steatosis 11/22/2019   Depression with anxiety 08/07/2019   Peripheral edema 02/08/2018   BMI 50.0-59.9, adult (HCC) 01/25/2018   Colon polyp, hyperplastic 07/16/2017   Diabetes mellitus treated with injections of non-insulin medication (HCC) 08/18/2015   Hyperlipidemia associated with type 2 diabetes mellitus (HCC) 07/08/2015   Environmental and seasonal allergies 07/08/2015   Avitaminosis D 07/08/2015   Beat, premature ventricular 02/11/2015   Past Medical History:  Diagnosis Date   Allergy    Complication of anesthesia    slow to wake up   Diabetes mellitus without complication (HCC)    Pneumonia    PONV (postoperative nausea and vomiting)    Sleep apnea    cpap   Stable angina pectoris (HCC) 08/10/2017   Outpatient Encounter Medications as of 07/20/2023  Medication Sig   acyclovir ointment (ZOVIRAX) 5 % APPLY 1 APPLICATION TOPICALLY DAILY AS NEEDED (COLD SORES).   azithromycin (ZITHROMAX) 250 MG tablet Take 2 tablets on day 1, then 1 tablet daily on days 2 through 5   b complex vitamins capsule Take 1 capsule by mouth daily.    BIOTIN PO Take by mouth.   Cholecalciferol (DIALYVITE VITAMIN D 5000) 125 MCG (5000 UT) capsule Take 5,000 Units by mouth 3 (three) times a week.   Continuous Blood Gluc Sensor (FREESTYLE LIBRE 14 DAY SENSOR) MISC 1 each by Does not apply route every 14 (fourteen) days.   cyclobenzaprine (FLEXERIL) 5 MG tablet Take 1 tablet (5 mg total) by mouth 3 (three) times daily as needed.   dapagliflozin propanediol (FARXIGA) 10 MG TABS tablet TAKE 1 TABLET BY MOUTH EVERY DAY BEFORE BREAKFAST   desvenlafaxine (PRISTIQ) 50 MG 24 hr tablet Take 50 mg by mouth daily.   EPINEPHrine 0.3 mg/0.3 mL IJ SOAJ injection Inject 0.3 mg into the muscle as needed for anaphylaxis.   famotidine-calcium carbonate-magnesium hydroxide (PEPCID COMPLETE) 10-800-165 MG chewable tablet Chew 1 tablet by mouth daily as needed (indigestion).   fenofibrate (TRICOR) 145 MG tablet Take 1 tablet (145 mg total) by mouth daily.   fexofenadine (ALLEGRA) 180 MG tablet Take by mouth.   fluconazole (DIFLUCAN) 100 MG tablet TAKE 1 TABLET BY MOUTH DAILY FOR 7 DAYS   MAGNESIUM PO Take by mouth daily.   NON FORMULARY at bedtime. Auto Cpap   nystatin cream (MYCOSTATIN) Apply 1 application topically 2 (two) times daily.   Polyethylene Glycol 3350 (MIRALAX PO) Take by mouth as needed.   pravastatin (PRAVACHOL) 40 MG tablet Take 1 tablet (40 mg total) by mouth once a week.   Probiotic Product (PROBIOTIC PO) Take by mouth daily.  tirzepatide (MOUNJARO) 5 MG/0.5ML Pen INJECT 5 MG SUBCUTANEOUSLY WEEKLY   valACYclovir (VALTREX) 1000 MG tablet TAKE 1 TABLET (1,000 MG TOTAL) BY MOUTH AS NEEDED (COLD SORES).   No facility-administered encounter medications on file as of 07/20/2023.   Past Surgical History:  Procedure Laterality Date   CHOLECYSTECTOMY     COLONOSCOPY N/A 11/27/2022   Procedure: COLONOSCOPY;  Surgeon: Jaynie Collins, DO;  Location: [facility] ENDOSCOPY;  Service: Gastroenterology;  Laterality: N/A;   COLONOSCOPY WITH PROPOFOL N/A  07/13/2017   Procedure: COLONOSCOPY WITH PROPOFOL;  Surgeon: Christena Deem, MD;  Location: [facility] ENDOSCOPY;  Service: Endoscopy;  Laterality: N/A;   dental implants Right    NASAL SINUS SURGERY     OOPHORECTOMY     SHOULDER ARTHROSCOPY WITH ROTATOR CUFF REPAIR AND SUBACROMIAL DECOMPRESSION Right 11/22/2020   Procedure: Right shoulder arthroscopic rotator cuff repair, subacromial decompression, and biceps tenodesis ,distal clavicle excision;  Surgeon: Signa Kell, MD;  Location: [facility] ORS;  Service: Orthopedics;  Laterality: Right;   TOTAL VAGINAL HYSTERECTOMY     TUBAL LIGATION      Virtual Visit via MyChart Video:   I connected with Solstice Lastinger Cosman on 07/20/23 via MyChart Video and verified that I am speaking with the correct person using appropriate identifiers.   The limitations, risks, security and privacy concerns of performing an evaluation and management service by MyChart Video, including the higher likelihood of inaccurate diagnoses and treatments, and the availability of in person appointments were reviewed. The possible need of an additional face-to-face encounter for complete and high quality delivery of care was discussed. The patient was also made aware that there may be a patient responsible charge related to this service. The patient expressed understanding and wishes to proceed.  Provider location is in medical facility. Patient location is at their home, different from provider location. People involved in care of the patient during this telehealth encounter were myself, my nurse/medical assistant, and my front office/scheduling team member.  Objective findings:   General: Speaking full sentences, no audible heavy breathing. Sounds alert and appropriately interactive. Well-appearing. Face symmetric. Extraocular movements intact. Pupils equal and round. No nasal flaring or accessory muscle use visualized.  Independent interpretation of notes and tests performed by  another provider:   None  Pertinent History, Exam, Impression, and Recommendations:   Problem List Items Addressed This Visit       Respiratory   Acute pansinusitis - Primary    Patient presents with concern over roughly 10-day history of facial pressure, nasal drainage of bloody and greenish discharge, throat pain.  She denies fevers, chills, no shortness of air or chest discomfort, some associated looser stools.  Has been using intranasal steroid and cough drops with limited benefit.  She describes pain and recreates tenderness along her right frontal and maxillary sinus regions.  Overall clinical picture most consistent with sinusitis, given duration of symptoms, plan as follows: - Take azithromycin regimen for full course - Start Mucinex (guaifenesin) 12-hour formulation twice daily while on antibiotics - Continue with current nasal spray - Get plenty of rest, adequate hydration, and contact us for any persistent symptoms after antibiotic course      Relevant Medications   azithromycin (ZITHROMAX) 250 MG tablet     Orders & Medications Medications:  Meds ordered this encounter  Medications   azithromycin (ZITHROMAX) 250 MG tablet    Sig: Take 2 tablets on day 1, then 1 tablet daily on days 2 through 5    Dispense:  6 tablet    Refill:  0   No orders of the defined types were placed in this encounter.    I discussed the above assessment and treatment plan with the patient. The patient was provided an opportunity to ask questions and all were answered. The patient agreed with the plan and demonstrated an understanding of the instructions.   The patient was advised to call back or seek an in-person evaluation if the symptoms worsen or if the condition fails to improve as anticipated.   I provided a total time of 30 minutes including both face-to-face and non-face-to-face time on 07/20/2023 inclusive of time utilized for medical chart review, information gathering, care  coordination with staff, and documentation completion.    Jerrol Banana, MD, [facility]   Primary Care Sports Medicine Primary Care and Sports Medicine at [facility]

## 2023-07-20 NOTE — Assessment & Plan Note (Signed)
Patient presents with concern over roughly 10-day history of facial pressure, nasal drainage of bloody and greenish discharge, throat pain.  She denies fevers, chills, no shortness of air or chest discomfort, some associated looser stools.  Has been using intranasal steroid and cough drops with limited benefit.  She describes pain and recreates tenderness along her right frontal and maxillary sinus regions.  Overall clinical picture most consistent with sinusitis, given duration of symptoms, plan as follows: - Take azithromycin regimen for full course - Start Mucinex (guaifenesin) 12-hour formulation twice daily while on antibiotics - Continue with current nasal spray - Get plenty of rest, adequate hydration, and contact us for any persistent symptoms after antibiotic course

## 2023-07-20 NOTE — Patient Instructions (Signed)
-   Take azithromycin regimen for full course - Start Mucinex (guaifenesin) 12-hour formulation twice daily while on antibiotics - Continue with current nasal spray - Get plenty of rest, adequate hydration, and contact us for any persistent symptoms after antibiotic course

## 2023-08-09 ENCOUNTER — Other Ambulatory Visit: Payer: Self-pay | Admitting: Internal Medicine

## 2023-08-09 DIAGNOSIS — E1169 Type 2 diabetes mellitus with other specified complication: Secondary | ICD-10-CM

## 2023-08-27 ENCOUNTER — Ambulatory Visit: Payer: 59 | Admitting: Internal Medicine

## 2023-09-17 ENCOUNTER — Other Ambulatory Visit: Payer: Self-pay | Admitting: Internal Medicine

## 2023-09-17 DIAGNOSIS — Z7985 Long-term (current) use of injectable non-insulin antidiabetic drugs: Secondary | ICD-10-CM

## 2023-09-18 NOTE — Telephone Encounter (Signed)
Requested medication (s) are due for refill today: Yes  Requested medication (s) are on the active medication list: Yes  Last refill:  05/29/23  Future visit scheduled: Yes  Notes to clinic:  Unable to refill per protocol, medication not assigned to the refill protocol.      Requested Prescriptions  Pending Prescriptions Disp Refills   MOUNJARO 5 MG/0.5ML Pen [Pharmacy Med Name: MOUNJARO 5 MG/0.5 ML PEN]      Sig: INJECT 5 MG SUBCUTANEOUSLY WEEKLY     Off-Protocol Failed - 09/17/2023  1:27 AM      Failed - Medication not assigned to a protocol, review manually.      Passed - Valid encounter within last 12 months    Recent Outpatient Visits           2 months ago Acute pansinusitis, recurrence not specified   [place] [facility] at [facility], Ocie Bob, MD   3 months ago Infectious diarrhea   [place] [facility] & [facility] Medicine at [facility], Ocie Bob, MD   4 months ago Hyperlipidemia associated with type 2 diabetes mellitus [facility])   [place] Primary Care & Sports Medicine at [facility], Nyoka Cowden, MD   9 months ago Annual physical exam   [facility] Primary Care & Sports Medicine at [facility], Nyoka Cowden, MD   1 year ago Type II diabetes mellitus with complication [facility])   [place] Primary Care & Sports Medicine at [facility], Nyoka Cowden, MD       Future Appointments             In 3 days Judithann Graves Nyoka Cowden, MD [facility] Primary Care & Sports Medicine at [facility]

## 2023-09-19 NOTE — Telephone Encounter (Signed)
Please review. Is pt staying at 5 MG?  KP

## 2023-09-21 ENCOUNTER — Ambulatory Visit (INDEPENDENT_AMBULATORY_CARE_PROVIDER_SITE_OTHER): Payer: 59 | Admitting: Internal Medicine

## 2023-09-21 ENCOUNTER — Encounter: Payer: Self-pay | Admitting: Internal Medicine

## 2023-09-21 VITALS — BP 128/72 | HR 86 | Ht 66.0 in | Wt 298.0 lb

## 2023-09-21 DIAGNOSIS — E119 Type 2 diabetes mellitus without complications: Secondary | ICD-10-CM | POA: Diagnosis not present

## 2023-09-21 DIAGNOSIS — Z7985 Long-term (current) use of injectable non-insulin antidiabetic drugs: Secondary | ICD-10-CM | POA: Diagnosis not present

## 2023-09-21 DIAGNOSIS — B009 Herpesviral infection, unspecified: Secondary | ICD-10-CM | POA: Diagnosis not present

## 2023-09-21 DIAGNOSIS — J0101 Acute recurrent maxillary sinusitis: Secondary | ICD-10-CM

## 2023-09-21 DIAGNOSIS — E785 Hyperlipidemia, unspecified: Secondary | ICD-10-CM

## 2023-09-21 DIAGNOSIS — E1169 Type 2 diabetes mellitus with other specified complication: Secondary | ICD-10-CM

## 2023-09-21 LAB — POCT GLYCOSYLATED HEMOGLOBIN (HGB A1C): Hemoglobin A1C: 6 % — AB (ref 4.0–5.6)

## 2023-09-21 MED ORDER — ACYCLOVIR 5 % EX OINT: 1.0000 | TOPICAL_OINTMENT | Freq: Every day | CUTANEOUS | 2 refills | Status: AC | PRN

## 2023-09-21 MED ORDER — NYSTATIN 100000 UNIT/GM EX CREA: 1.0000 | TOPICAL_CREAM | Freq: Two times a day (BID) | CUTANEOUS | 0 refills | Status: AC

## 2023-09-21 MED ORDER — VALACYCLOVIR HCL 1 G PO TABS
1000.0000 mg | ORAL_TABLET | ORAL | 5 refills | Status: AC | PRN
Start: 2023-09-21 — End: ?

## 2023-09-21 MED ORDER — AMOXICILLIN-POT CLAVULANATE 875-125 MG PO TABS
1.0000 | ORAL_TABLET | Freq: Two times a day (BID) | ORAL | 0 refills | Status: AC
Start: 2023-09-21 — End: 2023-10-01

## 2023-09-21 NOTE — Progress Notes (Signed)
Date:  09/21/2023   Name:  Mary Macias   DOB:  [birth year]   MRN:  [id number]   Chief Complaint: Diabetes and Hypertension  Diabetes She presents for her follow-up diabetic visit. She has type 2 diabetes mellitus. Pertinent negatives for hypoglycemia include no headaches or tremors. Pertinent negatives for diabetes include no chest pain, no fatigue, no polydipsia and no polyuria. Current diabetic treatments: [place]. She is compliant with treatment all of the time.  Hyperlipidemia This is a chronic problem. The problem is uncontrolled. Pertinent negatives include no chest pain or shortness of breath.  Sinus Problem This is a recurrent problem. The current episode started 1 to 4 weeks ago. The problem has been gradually worsening since onset. There has been no fever. Associated symptoms include congestion and sinus pressure. Pertinent negatives include no coughing, headaches or shortness of breath. (Green blood tinged sinus discharge)    Review of Systems  Constitutional:  Negative for appetite change, fatigue, fever and unexpected weight change.  HENT:  Positive for congestion, sinus pressure and sinus pain. Negative for tinnitus and trouble swallowing.   Eyes:  Negative for visual disturbance.  Respiratory:  Negative for cough, chest tightness and shortness of breath.   Cardiovascular:  Negative for chest pain, palpitations and leg swelling.  Gastrointestinal:  Negative for abdominal pain.  Endocrine: Negative for polydipsia and polyuria.  Genitourinary:  Negative for dysuria and hematuria.  Musculoskeletal:  Negative for arthralgias.  Neurological:  Negative for tremors, numbness and headaches.  Psychiatric/Behavioral:  Negative for dysphoric mood.      Lab Results  Component Value Date   NA 138 04/24/2023   K 4.2 04/24/2023   CO2 26 04/24/2023   GLUCOSE 114 (H) 04/24/2023   BUN 13 04/24/2023   CREATININE 0.91 04/24/2023   CALCIUM 9.1 04/24/2023   EGFR  73 04/24/2023   GFRNONAA 78 09/29/2020   Lab Results  Component Value Date   CHOL 243 (H) 02/06/2023   HDL 50 02/06/2023   LDLCALC 148 (H) 02/06/2023   TRIG 246 (H) 02/06/2023   CHOLHDL 4.9 (H) 02/06/2023   Lab Results  Component Value Date   TSH 2.200 02/06/2023   Lab Results  Component Value Date   HGBA1C 6.0 (A) 09/21/2023   Lab Results  Component Value Date   WBC 7.1 02/06/2023   HGB 14.7 02/06/2023   HCT 45.6 02/06/2023   MCV 94 02/06/2023   PLT 231 02/06/2023   Lab Results  Component Value Date   ALT 34 (H) 04/24/2023   AST 39 04/24/2023   ALKPHOS 58 04/24/2023   BILITOT 0.3 04/24/2023   Lab Results  Component Value Date   VD25OH 41.0 12/20/2021     Patient Active Problem List   Diagnosis Date Noted   Adult ADHD 04/28/2021   Hx of total hysterectomy 12/15/2020   Nontraumatic complete tear of right rotator cuff 12/15/2020   OSA (obstructive sleep apnea) 02/02/2020   Hepatic steatosis 11/22/2019   Depression with anxiety 08/07/2019   Peripheral edema 02/08/2018   BMI 50.0-59.9, adult (HCC) 01/25/2018   Colon polyp, hyperplastic 07/16/2017   Diabetes mellitus treated with injections of non-insulin medication (HCC) 08/18/2015   Hyperlipidemia associated with type 2 diabetes mellitus (HCC) 07/08/2015   Environmental and seasonal allergies 07/08/2015   Avitaminosis D 07/08/2015   Beat, premature ventricular 02/11/2015    Allergies  Allergen Reactions   Peanut-Containing Drug Products Hives and Swelling    Uses EPI-PEN As Needed for  this.   Povidone-Iodine Other (See Comments) and Rash    Other Reaction: Other reaction Other Reaction: Other reaction   Atorvastatin Other (See Comments)    Severe body aches   Meloxicam Other (See Comments)    Kidney function reduced  Kidney function reduced   Kidney function reduced   Povidone Iodine Rash   Propoxyphene Rash    Uncoded Allergy. Allergen: Citrus fruits   Iodine    Mounjaro [Tirzepatide] Other (See  Comments)    Flu like symptoms    Cephalexin Rash and Other (See Comments)   Metformin And Related Diarrhea   Trulicity [Dulaglutide]     Abdominal discomfort, flatus    Past Surgical History:  Procedure Laterality Date   CHOLECYSTECTOMY     COLONOSCOPY N/A 11/27/2022   Procedure: COLONOSCOPY;  Surgeon: Jaynie Collins, DO;  Location: [facility] ENDOSCOPY;  Service: Gastroenterology;  Laterality: N/A;   COLONOSCOPY WITH PROPOFOL N/A 07/13/2017   Procedure: COLONOSCOPY WITH PROPOFOL;  Surgeon: Christena Deem, MD;  Location: [facility] ENDOSCOPY;  Service: Endoscopy;  Laterality: N/A;   dental implants Right    NASAL SINUS SURGERY     OOPHORECTOMY     SHOULDER ARTHROSCOPY WITH ROTATOR CUFF REPAIR AND SUBACROMIAL DECOMPRESSION Right 11/22/2020   Procedure: Right shoulder arthroscopic rotator cuff repair, subacromial decompression, and biceps tenodesis ,distal clavicle excision;  Surgeon: Signa Kell, MD;  Location: [facility] ORS;  Service: Orthopedics;  Laterality: Right;   TOTAL VAGINAL HYSTERECTOMY     TUBAL LIGATION      Social History   Tobacco Use   Smoking status: Never   Smokeless tobacco: Never  Vaping Use   Vaping status: Never Used  Substance Use Topics   Alcohol use: No    Alcohol/week: 0.0 standard drinks of alcohol   Drug use: No     Medication list has been reviewed and updated.  Current Meds  Medication Sig   amoxicillin-clavulanate (AUGMENTIN) 875-125 MG tablet Take 1 tablet by mouth 2 (two) times daily for 10 days.   atomoxetine (STRATTERA) 40 MG capsule Take 40 mg by mouth daily.   b complex vitamins capsule Take 1 capsule by mouth daily.   BIOTIN PO Take by mouth.   Cholecalciferol (DIALYVITE VITAMIN D 5000) 125 MCG (5000 UT) capsule Take 5,000 Units by mouth 3 (three) times a week.   Continuous Blood Gluc Sensor (FREESTYLE LIBRE 14 DAY SENSOR) MISC 1 each by Does not apply route every 14 (fourteen) days.   cyclobenzaprine (FLEXERIL) 5 MG tablet Take 1 tablet  (5 mg total) by mouth 3 (three) times daily as needed.   dapagliflozin propanediol (FARXIGA) 10 MG TABS tablet TAKE 1 TABLET BY MOUTH EVERY DAY BEFORE BREAKFAST   EPINEPHrine 0.3 mg/0.3 mL IJ SOAJ injection Inject 0.3 mg into the muscle as needed for anaphylaxis.   escitalopram (LEXAPRO) 10 MG tablet Take 10 mg by mouth daily.   famotidine-calcium carbonate-magnesium hydroxide (PEPCID COMPLETE) 10-800-165 MG chewable tablet Chew 1 tablet by mouth daily as needed (indigestion).   fenofibrate (TRICOR) 145 MG tablet TAKE 1 TABLET BY MOUTH EVERY DAY   fexofenadine (ALLEGRA) 180 MG tablet Take by mouth.   fluconazole (DIFLUCAN) 100 MG tablet TAKE 1 TABLET BY MOUTH DAILY FOR 7 DAYS   MAGNESIUM PO Take by mouth daily.   NON FORMULARY at bedtime. Auto Cpap   Polyethylene Glycol 3350 (MIRALAX PO) Take by mouth as needed.   Probiotic Product (PROBIOTIC PO) Take by mouth daily.   tirzepatide [facility]) 5 MG/0.5ML Pen  INJECT 5 MG SUBCUTANEOUSLY WEEKLY   [DISCONTINUED] acyclovir ointment (ZOVIRAX) 5 % APPLY 1 APPLICATION TOPICALLY DAILY AS NEEDED (COLD SORES).   [DISCONTINUED] desvenlafaxine (PRISTIQ) 50 MG 24 hr tablet Take 50 mg by mouth daily.   [DISCONTINUED] nystatin cream (MYCOSTATIN) Apply 1 application topically 2 (two) times daily.   [DISCONTINUED] pravastatin (PRAVACHOL) 40 MG tablet Take 1 tablet (40 mg total) by mouth once a week.   [DISCONTINUED] valACYclovir (VALTREX) 1000 MG tablet TAKE 1 TABLET (1,000 MG TOTAL) BY MOUTH AS NEEDED (COLD SORES).       09/21/2023    3:18 PM 07/20/2023    2:21 PM 04/24/2023    4:46 PM 12/22/2022    4:00 PM  GAD 7 : Generalized Anxiety Score  Nervous, Anxious, on Edge 1 0 0 0  Control/stop worrying 1 0 0 0  Worry too much - different things 1 0 0 0  Trouble relaxing 1 0 1 0  Restless 1 0 1 0  Easily annoyed or irritable 1 0 1   Afraid - awful might happen 1 0 0 0  Total GAD 7 Score 7 0 3   Anxiety Difficulty Somewhat difficult Not difficult at all  Somewhat difficult Not difficult at all       09/21/2023    3:18 PM 07/20/2023    2:21 PM 04/24/2023    4:45 PM  Depression screen PHQ 2/9  Decreased Interest 2 0 0  Down, Depressed, Hopeless 2 0 1  PHQ - 2 Score 4 0 1  Altered sleeping 2 0 1  Tired, decreased energy 2 1 1   Change in appetite 2 0 2  Feeling bad or failure about yourself  0 0 0  Trouble concentrating 0 0 1  Moving slowly or fidgety/restless 0 0 1  Suicidal thoughts 0 0 0  PHQ-9 Score 10 1 7   Difficult doing work/chores Somewhat difficult Not difficult at all Somewhat difficult    BP Readings from Last 3 Encounters:  09/21/23 128/72  04/24/23 126/78  04/13/23 (!) 134/90    Physical Exam Vitals and nursing note reviewed.  Constitutional:      General: She is not in acute distress.    Appearance: She is well-developed.  HENT:     Head: Normocephalic and atraumatic.     Nose:     Right Sinus: Maxillary sinus tenderness present.     Left Sinus: Maxillary sinus tenderness present.  Cardiovascular:     Rate and Rhythm: Normal rate and regular rhythm.  Pulmonary:     Effort: Pulmonary effort is normal. No respiratory distress.     Breath sounds: Normal breath sounds.  Musculoskeletal:     Cervical back: Normal range of motion.  Skin:    General: Skin is warm and dry.     Findings: No rash.  Neurological:     Mental Status: She is alert and oriented to person, place, and time.  Psychiatric:        Mood and Affect: Mood normal.        Behavior: Behavior normal.     Wt Readings from Last 3 Encounters:  09/21/23 298 lb (135.2 kg)  04/24/23 297 lb (134.7 kg)  12/22/22 288 lb (130.6 kg)    BP 128/72   Pulse 86   Ht 5\' 6"  (1.676 m)   Wt 298 lb (135.2 kg)   SpO2 96%   BMI 48.10 kg/m   Assessment and Plan:  Problem List Items Addressed This Visit  Unprioritized   Hyperlipidemia associated with type 2 diabetes mellitus (HCC) (Chronic)   Not taking Pravachol or fenofibrate consistently  due to myalgias. Stop pravastatin; take fenofibrate daily to determine tolerance. Lipids next visit - discuss possible low dose crestor at that time      Diabetes mellitus treated with injections of non-insulin medication (HCC) - Primary   Blood sugars stable without hypoglycemic symptoms or events. Currently managed with [place] and Mounjaro. Tolerating Mounjaro with minimal reflux symptoms. Changes made last visit are none. Lab Results  Component Value Date   HGBA1C 6.5 (H) 04/24/2023  A1C today = 6.0.  NO change in current regimen.       Relevant Orders   POCT glycosylated hemoglobin (Hb A1C) (Completed)   Other Visit Diagnoses       Long-term current use of injectable noninsulin antidiabetic medication         Herpes simplex infection       Relevant Medications   valACYclovir (VALTREX) 1000 MG tablet   acyclovir ointment (ZOVIRAX) 5 %   nystatin cream (MYCOSTATIN)     Acute recurrent maxillary sinusitis       Relevant Medications   valACYclovir (VALTREX) 1000 MG tablet   amoxicillin-clavulanate (AUGMENTIN) 875-125 MG tablet       Return in about 4 months (around 01/20/2024) for CPX.    Reubin Milan, MD [facility] Primary Care and Sports Medicine [place]

## 2023-09-21 NOTE — Patient Instructions (Signed)
Schedule your Eye exam and ask them to send me the report.  Stop Pravachol completely. Take the Fenofibrate every day - call or message if you absolutely can not tolerate it. We will possibly discuss an alternative statin next visit.

## 2023-09-21 NOTE — Assessment & Plan Note (Addendum)
Blood sugars stable without hypoglycemic symptoms or events. Currently managed with [place] and Mounjaro. Tolerating Mounjaro with minimal reflux symptoms. Changes made last visit are none. Lab Results  Component Value Date   HGBA1C 6.5 (H) 04/24/2023  A1C today = 6.0.  NO change in current regimen.

## 2023-09-21 NOTE — Assessment & Plan Note (Addendum)
Not taking Pravachol or fenofibrate consistently due to myalgias. Stop pravastatin; take fenofibrate daily to determine tolerance. Lipids next visit - discuss possible low dose crestor at that time

## 2023-11-23 ENCOUNTER — Ambulatory Visit (INDEPENDENT_AMBULATORY_CARE_PROVIDER_SITE_OTHER): Payer: No Typology Code available for payment source | Admitting: Family Medicine

## 2023-11-23 ENCOUNTER — Encounter: Payer: Self-pay | Admitting: Family Medicine

## 2023-11-23 VITALS — BP 124/90 | HR 79 | Ht 66.0 in | Wt 305.4 lb

## 2023-11-23 DIAGNOSIS — J0101 Acute recurrent maxillary sinusitis: Secondary | ICD-10-CM

## 2023-11-23 DIAGNOSIS — M47812 Spondylosis without myelopathy or radiculopathy, cervical region: Secondary | ICD-10-CM | POA: Diagnosis not present

## 2023-11-23 MED ORDER — GABAPENTIN 100 MG PO CAPS: 100.0000 mg | ORAL_CAPSULE | Freq: Every day | ORAL | 0 refills | Status: AC

## 2023-11-23 NOTE — Assessment & Plan Note (Addendum)
 She has ongoing sinus issues characterized by dryness, irritation, and sores in her nasal passages that do not heal. She has experienced multiple sinus infections over the past few months, with symptoms including occasional green and crusty nasal discharge. She uses a CPAP machine and has been diligent about cleaning it and adjusting humidity settings. She moved to a new house in July, which she suspects might be contributing to her symptoms. She has tried acyclovir cream and has been on multiple rounds of antibiotics over the past 3 months without significant improvement. Her last ENT visit was over a year ago, where she was treated with steroids and antibiotics for blocked eustachian tubes.  Physical Exam HEENT: Left and right tympanic membranes and ear canals normal. Cobblestoning in throat. Bilateral frontal sinus tenderness. NECK: Non-tender bilateral submandibular lymphadenopathy.  CHEST: Lungs clear to auscultation bilaterally.  Chronic recurrent sinusitis Persistent dryness and irritation in nasal passages despite multiple rounds of antibiotics and use of Aciclovir cream. Noted sores in nasal passage and occasional green, crusty congestion upon waking. -Our office was able to schedule an ENT appointment on 11/26/2023 for further evaluation and management.

## 2023-11-23 NOTE — Assessment & Plan Note (Signed)
She also brings up a tremor in her left index finger, which occurs mostly at night or when her arm is resting on a surface. The tremor is not constant and is sometimes accompanied by a sensation of vibration.  She denies any overt numbness or tingling in fingers, no weakness.  She has a history of degenerative disc disease and has experienced a fall in the past, which resulted in a CT scan of her neck showing degenerative changes.   MUSCULOSKELETAL: Bilateral upper extremity strength preserved and symmetric. Weakness in right hand. Negative Spurling's test bilaterally.  Intermittent unilateral finger tremor in the setting of multilevel cervical spinal arthropathy Noted to be worse in the evenings. No clear correlation with medication use/changes however can be correlated to Strattera usage as Tremor is listed as 1% to 5% incidence. Possible cervical spine involvement given unilateral nature and evening predominance.  -Start trial of low-dose Gabapentin at bedtime, with option to increase up to three capsules as needed (100-300 mg at bedtime). -Referral placed physical therapy.

## 2023-11-25 NOTE — Progress Notes (Signed)
 Primary Care / Sports Medicine Office Visit  Patient Information:  Patient ID: Mary Macias, female DOB: [birth year] Age: 58 y.o. MRN: [id number]   Mary Macias is a pleasant 58 y.o. female presenting with the following:  Chief Complaint  Patient presents with   Sinusitis    Patient has been on 3 rounds of ABX since August for sinuses. She has been tired the last 4 months as well. She has a ENT specialist but thought she would come in to see primary care first before making a appt with ENT.     Vitals:   11/23/23 1421  BP: (!) 124/90  Pulse: 79  SpO2: 97%   Vitals:   11/23/23 1421  Weight: (!) 305 lb 6.4 oz (138.5 kg)  Height: 5\' 6"  (1.676 m)   Body mass index is 49.29 kg/m.  No results found.   Independent interpretation of notes and tests performed by another provider:   None  Procedures performed:   None  Pertinent History, Exam, Impression, and Recommendations:   Problem List Items Addressed This Visit     Acute recurrent maxillary sinusitis   She has ongoing sinus issues characterized by dryness, irritation, and sores in her nasal passages that do not heal. She has experienced multiple sinus infections over the past few months, with symptoms including occasional green and crusty nasal discharge. She uses a CPAP machine and has been diligent about cleaning it and adjusting humidity settings. She moved to a new house in July, which she suspects might be contributing to her symptoms. She has tried acyclovir cream and has been on multiple rounds of antibiotics over the past 3 months without significant improvement. Her last ENT visit was over a year ago, where she was treated with steroids and antibiotics for blocked eustachian tubes.  Physical Exam HEENT: Left and right tympanic membranes and ear canals normal. Cobblestoning in throat. Bilateral frontal sinus tenderness. NECK: Non-tender bilateral submandibular lymphadenopathy.  CHEST: Lungs clear to  auscultation bilaterally.  Chronic recurrent sinusitis Persistent dryness and irritation in nasal passages despite multiple rounds of antibiotics and use of Aciclovir cream. Noted sores in nasal passage and occasional green, crusty congestion upon waking. -Our office was able to schedule an ENT appointment on 11/26/2023 for further evaluation and management.       Cervical spondylosis - Primary   She also brings up a tremor in her left index finger, which occurs mostly at night or when her arm is resting on a surface. The tremor is not constant and is sometimes accompanied by a sensation of vibration.  She denies any overt numbness or tingling in fingers, no weakness.  She has a history of degenerative disc disease and has experienced a fall in the past, which resulted in a CT scan of her neck showing degenerative changes.   MUSCULOSKELETAL: Bilateral upper extremity strength preserved and symmetric. Weakness in right hand. Negative Spurling's test bilaterally.  Intermittent unilateral finger tremor in the setting of multilevel cervical spinal arthropathy Noted to be worse in the evenings. No clear correlation with medication use/changes however can be correlated to Strattera usage as Tremor is listed as 1% to 5% incidence. Possible cervical spine involvement given unilateral nature and evening predominance.  -Start trial of low-dose Gabapentin at bedtime, with option to increase up to three capsules as needed (100-300 mg at bedtime). -Referral placed physical therapy.      Relevant Medications   gabapentin (NEURONTIN) 100 MG capsule   Other Relevant  Orders   Ambulatory referral to Physical Therapy     Orders & Medications Medications:  Meds ordered this encounter  Medications   gabapentin (NEURONTIN) 100 MG capsule    Sig: Take 1-3 capsules (100-300 mg total) by mouth at bedtime. Find lowest dose that adequately controls symptoms    Dispense:  45 capsule    Refill:  0   Orders  Placed This Encounter  Procedures   Ambulatory referral to Physical Therapy     No follow-ups on file.     Jerrol Banana, MD, [facility]   Primary Care Sports Medicine Primary Care and Sports Medicine at [facility]

## 2023-12-15 ENCOUNTER — Other Ambulatory Visit: Payer: Self-pay | Admitting: Internal Medicine

## 2023-12-15 DIAGNOSIS — Z7985 Long-term (current) use of injectable non-insulin antidiabetic drugs: Secondary | ICD-10-CM

## 2023-12-17 ENCOUNTER — Other Ambulatory Visit: Payer: Self-pay | Admitting: Internal Medicine

## 2023-12-17 DIAGNOSIS — Z7985 Long-term (current) use of injectable non-insulin antidiabetic drugs: Secondary | ICD-10-CM

## 2023-12-17 NOTE — Telephone Encounter (Signed)
 Requested medication (s) are due for refill today: routing for review  Requested medication (s) are on the active medication list: yes  Last refill:  09/19/23  Future visit scheduled: yes  Notes to clinic:   Medication not assigned to a protocol, review manually. Possible new rx for protonix, no refill date.     Requested Prescriptions  Pending Prescriptions Disp Refills   MOUNJARO 5 MG/0.5ML Pen [Pharmacy Med Name: MOUNJARO 5 MG/0.5 ML PEN]      Sig: INJECT 5 MG SUBCUTANEOUSLY WEEKLY     Off-Protocol Failed - 12/17/2023  3:36 PM      Failed - Medication not assigned to a protocol, review manually.      Passed - Valid encounter within last 12 months    Recent Outpatient Visits           2 months ago Diabetes mellitus treated with injections of non-insulin medication (HCC)   [place] Primary Care & Sports Medicine at [facility], Nyoka Cowden, MD   5 months ago Acute pansinusitis, recurrence not specified   [place] Primary Care & Sports Medicine at [facility], Ocie Bob, MD   6 months ago Infectious diarrhea   [place] Primary Care & Sports Medicine at [facility], Ocie Bob, MD   7 months ago Hyperlipidemia associated with type 2 diabetes mellitus [facility])   [place] Primary Care & Sports Medicine at [facility], Nyoka Cowden, MD   12 months ago Annual physical exam   [facility] Care & [facility] Medicine at [facility], Nyoka Cowden, MD       Future Appointments             In 1 month Judithann Graves, Nyoka Cowden, MD [facility] Primary [facility] & [facility] at [facility] [place], [facility]             pantoprazole (PROTONIX) 40 MG tablet [Pharmacy Med Name: PANTOPRAZOLE SOD DR 40 MG TAB] 90 tablet 2    Sig: TAKE 1 TABLET BY MOUTH EVERY DAY     Gastroenterology: Proton Pump Inhibitors Passed - 12/17/2023  3:36 PM      Passed - Valid encounter within last 12 months    Recent Outpatient Visits           2 months  ago Diabetes mellitus treated with injections of non-insulin medication [facility])   [place] Primary Care & Sports Medicine at [facility], Nyoka Cowden, MD   5 months ago Acute pansinusitis, recurrence not specified   [place] Primary Care & Sports Medicine at [facility], Ocie Bob, MD   6 months ago Infectious diarrhea   [place] Primary Care & Sports Medicine at [facility], Ocie Bob, MD   7 months ago Hyperlipidemia associated with type 2 diabetes mellitus [facility])   [place] Primary Care & Sports Medicine at [facility], Nyoka Cowden, MD   12 months ago Annual physical exam   [facility] Primary Care & Sports Medicine at [facility], Nyoka Cowden, MD       Future Appointments             In 1 month Judithann Graves, Nyoka Cowden, MD [facility] Primary Care & Sports Medicine at [facility], [facility]

## 2023-12-18 ENCOUNTER — Other Ambulatory Visit: Payer: Self-pay | Admitting: Internal Medicine

## 2023-12-18 MED ORDER — MOUNJARO 5 MG/0.5ML ~~LOC~~ SOAJ
5.0000 mg | SUBCUTANEOUS | 0 refills | Status: AC
Start: 2023-12-18 — End: ?

## 2023-12-18 NOTE — Progress Notes (Unsigned)
 Date:  12/18/2023   Name:  Mary Macias   DOB:  06-Aug-1966   MRN:  [id number]   Chief Complaint: No chief complaint on file.  HPI  Review of Systems   Lab Results  Component Value Date   NA 138 04/24/2023   K 4.2 04/24/2023   CO2 26 04/24/2023   GLUCOSE 114 (H) 04/24/2023   BUN 13 04/24/2023   CREATININE 0.91 04/24/2023   CALCIUM 9.1 04/24/2023   EGFR 73 04/24/2023   GFRNONAA 78 09/29/2020   Lab Results  Component Value Date   CHOL 243 (H) 02/06/2023   HDL 50 02/06/2023   LDLCALC 148 (H) 02/06/2023   TRIG 246 (H) 02/06/2023   CHOLHDL 4.9 (H) 02/06/2023   Lab Results  Component Value Date   TSH 2.200 02/06/2023   Lab Results  Component Value Date   HGBA1C 6.0 (A) 09/21/2023   Lab Results  Component Value Date   WBC 7.1 02/06/2023   HGB 14.7 02/06/2023   HCT 45.6 02/06/2023   MCV 94 02/06/2023   PLT 231 02/06/2023   Lab Results  Component Value Date   ALT 34 (H) 04/24/2023   AST 39 04/24/2023   ALKPHOS 58 04/24/2023   BILITOT 0.3 04/24/2023   Lab Results  Component Value Date   VD25OH 41.0 12/20/2021     Patient Active Problem List   Diagnosis Date Noted   Cervical spondylosis 11/23/2023   Acute recurrent maxillary sinusitis 10/15/2021   Adult ADHD 04/28/2021   Hx of total hysterectomy 12/15/2020   Nontraumatic complete tear of right rotator cuff 12/15/2020   OSA (obstructive sleep apnea) 02/02/2020   Hepatic steatosis 11/22/2019   Depression with anxiety 08/07/2019   Peripheral edema 02/08/2018   BMI 50.0-59.9, adult (HCC) 01/25/2018   Colon polyp, hyperplastic 07/16/2017   Diabetes mellitus treated with injections of non-insulin medication (HCC) 08/18/2015   Hyperlipidemia associated with type 2 diabetes mellitus (HCC) 07/08/2015   Environmental and seasonal allergies 07/08/2015   Avitaminosis D 07/08/2015   Beat, premature ventricular 02/11/2015    Allergies  Allergen Reactions   Peanut-Containing Drug Products Hives and  Swelling    Uses EPI-PEN As Needed for this.   Povidone-Iodine Other (See Comments) and Rash    Other Reaction: Other reaction Other Reaction: Other reaction   Atorvastatin Other (See Comments)    Severe body aches   Meloxicam Other (See Comments)    Kidney function reduced  Kidney function reduced   Kidney function reduced   Povidone Iodine Rash   Propoxyphene Rash    Uncoded Allergy. Allergen: Citrus fruits   Iodine    Mounjaro [Tirzepatide] Other (See Comments)    Flu like symptoms    Cephalexin Rash and Other (See Comments)   Metformin And Related Diarrhea   Trulicity [Dulaglutide]     Abdominal discomfort, flatus    Past Surgical History:  Procedure Laterality Date   CHOLECYSTECTOMY     COLONOSCOPY N/A 11/27/2022   Procedure: COLONOSCOPY;  Surgeon: Jaynie Collins, DO;  Location: [facility] ENDOSCOPY;  Service: Gastroenterology;  Laterality: N/A;   COLONOSCOPY WITH PROPOFOL N/A 07/13/2017   Procedure: COLONOSCOPY WITH PROPOFOL;  Surgeon: Christena Deem, MD;  Location: [facility] ENDOSCOPY;  Service: Endoscopy;  Laterality: N/A;   dental implants Right    NASAL SINUS SURGERY     OOPHORECTOMY     SHOULDER ARTHROSCOPY WITH ROTATOR CUFF REPAIR AND SUBACROMIAL DECOMPRESSION Right 11/22/2020   Procedure: Right shoulder arthroscopic rotator cuff repair,  subacromial decompression, and biceps tenodesis ,distal clavicle excision;  Surgeon: Signa Kell, MD;  Location: [facility] ORS;  Service: Orthopedics;  Laterality: Right;   TOTAL VAGINAL HYSTERECTOMY     TUBAL LIGATION      Social History   Tobacco Use   Smoking status: Never   Smokeless tobacco: Never  Vaping Use   Vaping status: Never Used  Substance Use Topics   Alcohol use: No    Alcohol/week: 0.0 standard drinks of alcohol   Drug use: No     Medication list has been reviewed and updated.  No outpatient medications have been marked as taking for the 12/18/23 encounter (Orders Only) with Reubin Milan, MD.        11/23/2023    2:27 PM 09/21/2023    3:18 PM 07/20/2023    2:21 PM 04/24/2023    4:46 PM  GAD 7 : Generalized Anxiety Score  Nervous, Anxious, on Edge 1 1 0 0  Control/stop worrying 1 1 0 0  Worry too much - different things 1 1 0 0  Trouble relaxing 1 1 0 1  Restless 0 1 0 1  Easily annoyed or irritable 1 1 0 1  Afraid - awful might happen 1 1 0 0  Total GAD 7 Score 6 7 0 3  Anxiety Difficulty Somewhat difficult Somewhat difficult Not difficult at all Somewhat difficult       11/23/2023    2:27 PM 09/21/2023    3:18 PM 07/20/2023    2:21 PM  Depression screen PHQ 2/9  Decreased Interest 1 2 0  Down, Depressed, Hopeless  2 0  PHQ - 2 Score 1 4 0  Altered sleeping 1 2 0  Tired, decreased energy 2 2 1   Change in appetite 1 2 0  Feeling bad or failure about yourself  0 0 0  Trouble concentrating 1 0 0  Moving slowly or fidgety/restless 0 0 0  Suicidal thoughts 0 0 0  PHQ-9 Score 6 10 1   Difficult doing work/chores Not difficult at all Somewhat difficult Not difficult at all    BP Readings from Last 3 Encounters:  11/23/23 (!) 124/90  09/21/23 128/72  04/24/23 126/78    Physical Exam  Wt Readings from Last 3 Encounters:  11/23/23 (!) 305 lb 6.4 oz (138.5 kg)  09/21/23 298 lb (135.2 kg)  04/24/23 297 lb (134.7 kg)    There were no vitals taken for this visit.  Assessment and Plan:  Problem List Items Addressed This Visit   None   No follow-ups on file.    Reubin Milan, MD [facility] [place]

## 2023-12-18 NOTE — Telephone Encounter (Signed)
 Please review. Pantoprazole not on medication list.  KP

## 2023-12-20 ENCOUNTER — Ambulatory Visit

## 2023-12-25 ENCOUNTER — Ambulatory Visit: Attending: Family Medicine

## 2023-12-25 DIAGNOSIS — M47812 Spondylosis without myelopathy or radiculopathy, cervical region: Secondary | ICD-10-CM | POA: Diagnosis not present

## 2023-12-25 DIAGNOSIS — G44011 Episodic cluster headache, intractable: Secondary | ICD-10-CM | POA: Diagnosis present

## 2023-12-25 DIAGNOSIS — M542 Cervicalgia: Secondary | ICD-10-CM | POA: Insufficient documentation

## 2023-12-25 NOTE — Patient Instructions (Signed)
 Marland Kitchen

## 2023-12-25 NOTE — Therapy (Signed)
 OUTPATIENT PHYSICAL THERAPY CERVICAL EVALUATION   Patient Name: Mary Macias MRN: [id number] DOB:1966-04-01, 58 y.o., female Today's Date: 12/25/2023  END OF SESSION:  PT End of Session - 12/25/23 1548     Visit Number 1    Number of Visits 8    Date for PT Re-Evaluation 02/22/24    PT Start Time 1450    PT Stop Time 1530    PT Time Calculation (min) 40 min    Activity Tolerance Patient tolerated treatment well    Behavior During Therapy [facility] for tasks assessed/performed             Past Medical History:  Diagnosis Date   Allergy    Complication of anesthesia    slow to wake up   Diabetes mellitus without complication (HCC)    Pneumonia    PONV (postoperative nausea and vomiting)    Sleep apnea    cpap   Stable angina pectoris (HCC) 08/10/2017   Past Surgical History:  Procedure Laterality Date   CHOLECYSTECTOMY     COLONOSCOPY N/A 11/27/2022   Procedure: COLONOSCOPY;  Surgeon: Jaynie Collins, DO;  Location: [facility] ENDOSCOPY;  Service: Gastroenterology;  Laterality: N/A;   COLONOSCOPY WITH PROPOFOL N/A 07/13/2017   Procedure: COLONOSCOPY WITH PROPOFOL;  Surgeon: Christena Deem, MD;  Location: [facility] ENDOSCOPY;  Service: Endoscopy;  Laterality: N/A;   dental implants Right    NASAL SINUS SURGERY     OOPHORECTOMY     SHOULDER ARTHROSCOPY WITH ROTATOR CUFF REPAIR AND SUBACROMIAL DECOMPRESSION Right 11/22/2020   Procedure: Right shoulder arthroscopic rotator cuff repair, subacromial decompression, and biceps tenodesis ,distal clavicle excision;  Surgeon: Signa Kell, MD;  Location: [facility] ORS;  Service: Orthopedics;  Laterality: Right;   TOTAL VAGINAL HYSTERECTOMY     TUBAL LIGATION     Patient Active Problem List   Diagnosis Date Noted   Cervical spondylosis 11/23/2023   Acute recurrent maxillary sinusitis 10/15/2021   Adult ADHD 04/28/2021   Hx of total hysterectomy 12/15/2020   Nontraumatic complete tear of right rotator cuff 12/15/2020   OSA  (obstructive sleep apnea) 02/02/2020   Hepatic steatosis 11/22/2019   Depression with anxiety 08/07/2019   Peripheral edema 02/08/2018   BMI 50.0-59.9, adult (HCC) 01/25/2018   Colon polyp, hyperplastic 07/16/2017   Diabetes mellitus treated with injections of non-insulin medication (HCC) 08/18/2015   Hyperlipidemia associated with type 2 diabetes mellitus (HCC) 07/08/2015   Environmental and seasonal allergies 07/08/2015   Avitaminosis D 07/08/2015   Beat, premature ventricular 02/11/2015    Past Medical History:  Diagnosis Date   Allergy    Complication of anesthesia    slow to wake up   Diabetes mellitus without complication (HCC)    Pneumonia    PONV (postoperative nausea and vomiting)    Sleep apnea    cpap   Stable angina pectoris (HCC) 08/10/2017   Past Surgical History:  Procedure Laterality Date   CHOLECYSTECTOMY     COLONOSCOPY N/A 11/27/2022   Procedure: COLONOSCOPY;  Surgeon: Jaynie Collins, DO;  Location: [facility] ENDOSCOPY;  Service: Gastroenterology;  Laterality: N/A;   COLONOSCOPY WITH PROPOFOL N/A 07/13/2017   Procedure: COLONOSCOPY WITH PROPOFOL;  Surgeon: Christena Deem, MD;  Location: [facility] ENDOSCOPY;  Service: Endoscopy;  Laterality: N/A;   dental implants Right    NASAL SINUS SURGERY     OOPHORECTOMY     SHOULDER ARTHROSCOPY WITH ROTATOR CUFF REPAIR AND SUBACROMIAL DECOMPRESSION Right 11/22/2020   Procedure: Right shoulder arthroscopic  rotator cuff repair, subacromial decompression, and biceps tenodesis ,distal clavicle excision;  Surgeon: Signa Kell, MD;  Location: [facility] ORS;  Service: Orthopedics;  Laterality: Right;   TOTAL VAGINAL HYSTERECTOMY     TUBAL LIGATION     Patient Active Problem List   Diagnosis Date Noted   Cervical spondylosis 11/23/2023   Acute recurrent maxillary sinusitis 10/15/2021   Adult ADHD 04/28/2021   Hx of total hysterectomy 12/15/2020   Nontraumatic complete tear of right rotator cuff 12/15/2020   OSA (obstructive  sleep apnea) 02/02/2020   Hepatic steatosis 11/22/2019   Depression with anxiety 08/07/2019   Peripheral edema 02/08/2018   BMI 50.0-59.9, adult (HCC) 01/25/2018   Colon polyp, hyperplastic 07/16/2017   Diabetes mellitus treated with injections of non-insulin medication (HCC) 08/18/2015   Hyperlipidemia associated with type 2 diabetes mellitus (HCC) 07/08/2015   Environmental and seasonal allergies 07/08/2015   Avitaminosis D 07/08/2015   Beat, premature ventricular 02/11/2015    PCP: Bari Edward MD  REFERRING PROVIDER: Kerri Perches MD  REFERRING DIAG: Cervical Spondylosis  THERAPY DIAG:  Headache, Neck tightness and neck pain.   Rationale for Evaluation and Treatment: Rehabilitation  ONSET DATE: 6 to 8 months ago.   SUBJECTIVE:                                                                                                                                                                                                         SUBJECTIVE STATEMENT: Pt reported: " My L index finger twitches, tremors and so when I say my Doctor. My CT from 2024 and thinks it is due to arthritis. Since my R shoulder surgery RCR and Bicep tenodesis my neck feels tight. Denies arm pain.   Hand dominance: Right  PERTINENT HISTORY:  As per EMR: [name]  reported of Index finger, which occurs mostly at night or when her arm is resting on a surface. The tremor is not constant and is sometimes accompanied by a sensation of vibration.  She denies any overt numbness or tingling in fingers, no weakness. She has a history of degenerative disc disease and has experienced a fall in the past, which resulted in a CT scan of her neck showing degenerative changes.  Intermittent unilateral finger tremor in the setting of multilevel cervical spinal arthropathy. Noted to be worse in the evenings. No clear correlation with medication use/changes however can be correlated to Strattera usage as Tremor is  listed as 1% to 5% incidence. Possible cervical spine involvement given unilateral nature and evening predominance. Started trial of low-dose Gabapentin  at bedtime, with option to increase up to three capsules as needed   PAIN:  Are you having pain? No, have tension headaches 2 x week from back of the neck in to front.   PRECAUTIONS: None  RED FLAGS: None     WEIGHT BEARING RESTRICTIONS: No  FALLS:  Has patient fallen in last 6 months? 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   PLOF: Independent  PATIENT GOALS: " I want to move my neck better and not feel like popping it so that I can play with my grand children and work without pain."   NEXT MD VISIT:  None this time.   OBJECTIVE:  Note: Objective measures were completed at Evaluation unless otherwise noted.  DIAGNOSTIC FINDINGS:   CT Scan:  Disc levels: Disc space narrowing with marginal osteophytes C3-4 through C5-6. 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    PATIENT SURVEYS:  NDI 11/50= 22% Quick Dash 14= 6.81 assessment HDI: 28  COGNITION: Overall cognitive status: Within functional limits for tasks assessed  SENSATION: WFL  REFLEX : L brachioradialis : absent  POSTURE: rounded shoulders, forward head, decreased lumbar lordosis, and decreased Cervical Lordosis  PALPATION: No pain elicited   CERVICAL ROM:   Active ROM A/PROM (deg) eval  Flexion WFL and tight  Extension wfl  Right lateral flexion WFL and tight  Left lateral flexion WFL and tight  Right rotation WFL and tight  Left rotation WFL and tight   (Blank rows = not tested)  UPPER EXTREMITY ROM: WNL  UPPER EXTREMITY MMT: 5/5  EXCEPT L Two Jaw Chuck grip 3/5    CERVICAL SPECIAL TESTS:  Neck flexor muscle endurance test: Negative, Spurling's test: Negative, Distraction test: Positive (  pt felt relief)  and Sharp pursor's test: Negative  FUNCTIONAL TESTS:  None indicated  TREATMENT DATE: 12/25/2023                                                                                                                               Low complexity Evaluation completed  TA: 12 mins  Median nerve glide 10 reps PT discussed Posture, anatomy, significance of compliance to HEP  to promote rehab outcome.    PATIENT EDUCATION:  Education details: Posture education Person educated: Patient Education method: Explanation, Actor cues, and Verbal cues Education comprehension: verbalized understanding and needs further education  HOME EXERCISE PROGRAM: Median Nerve glide provided with good recall memory. 10 reps 4 to 5 times a day.   ASSESSMENT:  CLINICAL IMPRESSION: Patient [name]  is a 58 y.o. Female who was seen today for physical therapy evaluation and treatment for neck tightness, tremor like symptoms in index finger and  2 x week tension headaches which originates from the neck. Pt goal is to be able to play with grandchildren without pain. PT assessment revealed median  nerve involvement, decreased end range in C/S due to tightness and pinch grip weakness limiting pt mildly with functions. Headache makes pt moderately limited with functions confirmed by NDI/HDI and Quick dash scores. Positive Median nerve tension test.  Pt will benefit from Skilled PT up to 8 sessions to help pt achieve her goals.    OBJECTIVE IMPAIRMENTS: decreased mobility, decreased ROM, and decreased strength.   ACTIVITY LIMITATIONS: headaches and gripping with LUE  PARTICIPATION LIMITATIONS: cleaning and occupation   REHAB POTENTIAL: Good  CLINICAL DECISION MAKING: Stable/uncomplicated  EVALUATION COMPLEXITY: Low   GOALS: Goals reviewed with patient? Yes  SHORT TERM GOALS: Target date: 01/11/2024  Pt will demonstrate 100% compliance to HEP in clinic to demonstrate long term benefit to  rehab interventions.  Baseline:  To be provided next sessions  Goal status: INITIAL    LONG TERM GOALS: Target date: 02/22/2024  Pt will improve C/S ROM to AFL without tightness and pain to improve headaches and Median nerve compression.  Baseline: WFL and tight ness at the end range.  Goal status: INITIAL  2.  Pt will demonstrate 0/10 pain with Median nerve tension test Baseline: Positive with tingling in index, middle finger.  Goal status: INITIAL  3.  Pt will demonstrate <5/50 score on NDI to improve QOL Baseline: 11/50 Goal status: INITIAL  4.  Pt will improve Quick Dash score to 11 to demonstrate improve UE functions Baseline: 14 Goal status: INITIAL  5.  Pt will reduce HDI score by 50 % to demonstrate improved QOL.  Baseline: 28= 37% disability Goal status: INITIAL  6. Pt will improve 2 jaw chuck strength to 3+/5 to demonstrate improve UE functions.              Baseline: 3/5             Goal Status: INITIAL    PLAN:  PT FREQUENCY: 1-2x/week  PT DURATION: 8 weeks  PLANNED INTERVENTIONS: 97110-Therapeutic exercises, 97530- Therapeutic activity, [id number]- Neuromuscular re-education, 97535- Self Care, [id number]- Manual therapy, and [id number]- Electrical stimulation (unattended)  PLAN FOR NEXT SESSION: Median nerve glide review, C/S manual traction/ mechanical traction, C/S stretches, Neck isometrics   Janet Berlin PT DPT 5:13 PM,12/25/23

## 2023-12-27 ENCOUNTER — Ambulatory Visit

## 2023-12-27 DIAGNOSIS — G44011 Episodic cluster headache, intractable: Secondary | ICD-10-CM

## 2023-12-27 DIAGNOSIS — M542 Cervicalgia: Secondary | ICD-10-CM

## 2023-12-27 NOTE — Therapy (Signed)
 OUTPATIENT PHYSICAL THERAPY CERVICAL EVALUATION   Patient Name: Mary Macias MRN: [id number] DOB:[birth year], 58 y.o., female Today's Date: 12/27/2023  END OF SESSION:  PT End of Session - 12/27/23 1757     Visit Number 2    Number of Visits 8    Date for PT Re-Evaluation 02/22/24    PT Start Time 1445    PT Stop Time 1528    PT Time Calculation (min) 43 min    Activity Tolerance Patient tolerated treatment well    Behavior During Therapy [facility] for tasks assessed/performed              Past Medical History:  Diagnosis Date   Allergy    Complication of anesthesia    slow to wake up   Diabetes mellitus without complication (HCC)    Pneumonia    PONV (postoperative nausea and vomiting)    Sleep apnea    cpap   Stable angina pectoris (HCC) 08/10/2017   Past Surgical History:  Procedure Laterality Date   CHOLECYSTECTOMY     COLONOSCOPY N/A 11/27/2022   Procedure: COLONOSCOPY;  Surgeon: Jaynie Collins, DO;  Location: [facility] ENDOSCOPY;  Service: Gastroenterology;  Laterality: N/A;   COLONOSCOPY WITH PROPOFOL N/A 07/13/2017   Procedure: COLONOSCOPY WITH PROPOFOL;  Surgeon: Christena Deem, MD;  Location: [facility] ENDOSCOPY;  Service: Endoscopy;  Laterality: N/A;   dental implants Right    NASAL SINUS SURGERY     OOPHORECTOMY     SHOULDER ARTHROSCOPY WITH ROTATOR CUFF REPAIR AND SUBACROMIAL DECOMPRESSION Right 11/22/2020   Procedure: Right shoulder arthroscopic rotator cuff repair, subacromial decompression, and biceps tenodesis ,distal clavicle excision;  Surgeon: Signa Kell, MD;  Location: [facility] ORS;  Service: Orthopedics;  Laterality: Right;   TOTAL VAGINAL HYSTERECTOMY     TUBAL LIGATION     Patient Active Problem List   Diagnosis Date Noted   Cervical spondylosis 11/23/2023   Acute recurrent maxillary sinusitis 10/15/2021   Adult ADHD 04/28/2021   Hx of total hysterectomy 12/15/2020   Nontraumatic complete tear of right rotator cuff 12/15/2020   OSA  (obstructive sleep apnea) 02/02/2020   Hepatic steatosis 11/22/2019   Depression with anxiety 08/07/2019   Peripheral edema 02/08/2018   BMI 50.0-59.9, adult (HCC) 01/25/2018   Colon polyp, hyperplastic 07/16/2017   Diabetes mellitus treated with injections of non-insulin medication (HCC) 08/18/2015   Hyperlipidemia associated with type 2 diabetes mellitus (HCC) 07/08/2015   Environmental and seasonal allergies 07/08/2015   Avitaminosis D 07/08/2015   Beat, premature ventricular 02/11/2015    Past Medical History:  Diagnosis Date   Allergy    Complication of anesthesia    slow to wake up   Diabetes mellitus without complication (HCC)    Pneumonia    PONV (postoperative nausea and vomiting)    Sleep apnea    cpap   Stable angina pectoris (HCC) 08/10/2017   Past Surgical History:  Procedure Laterality Date   CHOLECYSTECTOMY     COLONOSCOPY N/A 11/27/2022   Procedure: COLONOSCOPY;  Surgeon: Jaynie Collins, DO;  Location: [facility] ENDOSCOPY;  Service: Gastroenterology;  Laterality: N/A;   COLONOSCOPY WITH PROPOFOL N/A 07/13/2017   Procedure: COLONOSCOPY WITH PROPOFOL;  Surgeon: Christena Deem, MD;  Location: [facility] ENDOSCOPY;  Service: Endoscopy;  Laterality: N/A;   dental implants Right    NASAL SINUS SURGERY     OOPHORECTOMY     SHOULDER ARTHROSCOPY WITH ROTATOR CUFF REPAIR AND SUBACROMIAL DECOMPRESSION Right 11/22/2020   Procedure: Right shoulder  arthroscopic rotator cuff repair, subacromial decompression, and biceps tenodesis ,distal clavicle excision;  Surgeon: Signa Kell, MD;  Location: [facility] ORS;  Service: Orthopedics;  Laterality: Right;   TOTAL VAGINAL HYSTERECTOMY     TUBAL LIGATION     Patient Active Problem List   Diagnosis Date Noted   Cervical spondylosis 11/23/2023   Acute recurrent maxillary sinusitis 10/15/2021   Adult ADHD 04/28/2021   Hx of total hysterectomy 12/15/2020   Nontraumatic complete tear of right rotator cuff 12/15/2020   OSA (obstructive  sleep apnea) 02/02/2020   Hepatic steatosis 11/22/2019   Depression with anxiety 08/07/2019   Peripheral edema 02/08/2018   BMI 50.0-59.9, adult (HCC) 01/25/2018   Colon polyp, hyperplastic 07/16/2017   Diabetes mellitus treated with injections of non-insulin medication (HCC) 08/18/2015   Hyperlipidemia associated with type 2 diabetes mellitus (HCC) 07/08/2015   Environmental and seasonal allergies 07/08/2015   Avitaminosis D 07/08/2015   Beat, premature ventricular 02/11/2015    PCP: Bari Edward MD  REFERRING PROVIDER: Kerri Perches MD  REFERRING DIAG: Cervical Spondylosis  THERAPY DIAG:  Headache, Neck tightness and neck pain.   Rationale for Evaluation and Treatment: Rehabilitation  ONSET DATE: 6 to 8 months ago.   SUBJECTIVE:                                                                                                                                                                                                         SUBJECTIVE STATEMENT: Pt reported: " My L index finger twitches, tremors and so when I say my Doctor. My CT from 2024 and thinks it is due to arthritis. Since my R shoulder surgery RCR and Bicep tenodesis my neck feels tight. Denies arm pain.   Hand dominance: Right  PERTINENT HISTORY:  As per EMR: [name]  reported of Index finger, which occurs mostly at night or when her arm is resting on a surface. The tremor is not constant and is sometimes accompanied by a sensation of vibration.  She denies any overt numbness or tingling in fingers, no weakness. She has a history of degenerative disc disease and has experienced a fall in the past, which resulted in a CT scan of her neck showing degenerative changes.  Intermittent unilateral finger tremor in the setting of multilevel cervical spinal arthropathy. Noted to be worse in the evenings. No clear correlation with medication use/changes however can be correlated to Strattera usage as Tremor is  listed as 1% to 5% incidence. Possible cervical spine involvement given unilateral nature and evening predominance. Started trial of low-dose  Gabapentin at bedtime, with option to increase up to three capsules as needed   PAIN:  Are you having pain? No, have tension headaches 2 x week from back of the neck in to front.   PRECAUTIONS: None  RED FLAGS: None     WEIGHT BEARING RESTRICTIONS: No  FALLS:  Has patient fallen in last 6 months? 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   PLOF: Independent  PATIENT GOALS: " I want to move my neck better and not feel like popping it so that I can play with my grand children and work without pain."   NEXT MD VISIT:  None this time.   OBJECTIVE:  Note: Objective measures were completed at Evaluation unless otherwise noted.  DIAGNOSTIC FINDINGS:   CT Scan:  Disc levels: Disc space narrowing with marginal osteophytes C3-4 through C5-6. 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    PATIENT SURVEYS:  NDI 11/50= 22% Quick Dash 14= 6.81 assessment HDI: 28  COGNITION: Overall cognitive status: Within functional limits for tasks assessed  SENSATION: WFL  REFLEX : L brachioradialis : absent  POSTURE: rounded shoulders, forward head, decreased lumbar lordosis, and decreased Cervical Lordosis  PALPATION: No pain elicited   CERVICAL ROM:   Active ROM A/PROM (deg) eval  Flexion WFL and tight  Extension wfl  Right lateral flexion WFL and tight  Left lateral flexion WFL and tight  Right rotation WFL and tight  Left rotation WFL and tight   (Blank rows = not tested)  UPPER EXTREMITY ROM: WNL  UPPER EXTREMITY MMT: 5/5  EXCEPT L Two Jaw Chuck grip 3/5    CERVICAL SPECIAL TESTS:  Neck flexor muscle endurance test: Negative, Spurling's test: Negative, Distraction test: Positive (  pt felt relief)  and Sharp pursor's test: Negative  FUNCTIONAL TESTS:  None indicated  TREATMENT DATE: 12/27/2023                                                                                                                               There ex: 35 mins Manual traction to C/S and Fascia mobilization x 5 mins Corner door stretch 2 x 60 secs C/S stretch Flex/UT/Levator scap 2 x 30 secs each C/S isometrics in flex/ext/Rot/SB 2 1 x 10 reps 5 secs hold Scapular retraction with RTB 2 x 01 reps Shoulder Shrugs with Post roll and RTB 2 x 01 reps. HEP provided in H/O.    TA: 8 mins  HEP reviewed and provided with education of importance of proper technique and symptoms to prevent adverse effect of the Exs. HEP in H/O.    PATIENT EDUCATION:  Education details: Posture education Person educated: Patient Education method: Explanation, Actor cues, and Verbal cues Education comprehension: verbalized understanding and needs further education  HOME EXERCISE PROGRAM: Median Nerve glide provided with good recall memory. 10 reps 4  to 5 times a day.  Access Code: [email or web address] URL: [email or web address] Date: 12/27/2023 Prepared by: Janet Berlin  Exercises - Doorway Pec Stretch at 90 Degrees Abduction  - 2 x daily - 7 x weekly - 1 sets - 2 reps - 60secs hold - Seated Cervical Flexion Stretch with Finger Support Behind Neck  - 7 x weekly - 1 sets - 10 reps - Seated Upper Trapezius Stretch  - 2 x daily - 7 x weekly - 1 sets - 4 reps - 30secs hold - Seated Levator Scapulae Stretch  - 2 x daily - 7 x weekly - 1 sets - 4 reps - 30secs hold - Seated Cervical Retraction  - 1 x daily - 7 x weekly - 1 sets - 10 reps - Seated Isometric Cervical Extension  - 1 x daily - 7 x weekly - 1 sets - 10 reps - 5secs hold - Seated Isometric Cervical Flexion  - 1 x daily - 7 x weekly - 1 sets - 10 reps - 5secs hold - Seated Isometric Cervical Rotation  - 1 x daily - 7 x weekly - 1 sets - 10 reps - 5secs  hold - Seated Isometric Cervical Sidebending  - 1 x daily - 7 x weekly - 1 sets - 10 reps - 5secs hold - Scapular Retraction with Resistance  - 1 x daily - 7 x weekly - 3 sets - 10 reps - Standing Shoulder Shrug and Retraction with Resistance  - 1 x daily - 7 x weekly - 3 sets - 10 reps  ASSESSMENT:  CLINICAL IMPRESSION: Pt presents with cont Tightness in C/S, twitching in the Index finger, and tension headache. PT provided C/S traction and fascia mobilization. Pt introduced to Stretches, isometrics and Strengthening exs. HEP reviewed and provided with education of importance of proper technique and symptoms to prevent adverse effect of the Exs. HEP in H/O. Pt tol tx well. A rough patch of skin with mild redness  palpated in Occiput and atlas area which is tender with palpation and remains sore.  PT advised Pt to show it ot MD during her next visit in May or call if needed.   Pt will benefit from Skilled PT up to 8 sessions to help pt achieve her goals.    OBJECTIVE IMPAIRMENTS: decreased mobility, decreased ROM, and decreased strength.   ACTIVITY LIMITATIONS: headaches and gripping with LUE  PARTICIPATION LIMITATIONS: cleaning and occupation   REHAB POTENTIAL: Good  CLINICAL DECISION MAKING: Stable/uncomplicated  EVALUATION COMPLEXITY: Low   GOALS: Goals reviewed with patient? Yes  SHORT TERM GOALS: Target date: 01/11/2024  Pt will demonstrate 100% compliance to HEP in clinic to demonstrate long term benefit to rehab interventions.  Baseline:  To be provided next sessions  Goal status: INITIAL    LONG TERM GOALS: Target date: 02/22/2024  Pt will improve C/S ROM to AFL without tightness and pain to improve headaches and Median nerve compression.  Baseline: WFL and tight ness at the end range.  Goal status: INITIAL  2.  Pt will demonstrate 0/10 pain with Median nerve tension test Baseline: Positive with tingling in index, middle finger.  Goal status: INITIAL  3.  Pt will  demonstrate <5/50 score on NDI to improve QOL Baseline: 11/50 Goal status: INITIAL  4.  Pt will improve Quick Dash score to 11 to demonstrate improve UE functions Baseline: 14 Goal status: INITIAL  5.  Pt will reduce HDI score by 50 % to demonstrate improved QOL.  Baseline: 28=  37% disability Goal status: INITIAL  6. Pt will improve 2 jaw chuck strength to 3+/5 to demonstrate improve UE functions.              Baseline: 3/5             Goal Status: INITIAL    PLAN:  PT FREQUENCY: 1-2x/week  PT DURATION: 8 weeks  PLANNED INTERVENTIONS: 97110-Therapeutic exercises, 97530- Therapeutic activity, [id number]- Neuromuscular re-education, 97535- Self Care, [id number]- Manual therapy, and [id number]- Electrical stimulation (unattended)  PLAN FOR NEXT SESSION: Median nerve glide review, C/S manual traction/ mechanical traction, C/S stretches, Neck isometrics   Sorina Derrig PT DPT 6:00 PM,12/27/23

## 2024-01-03 ENCOUNTER — Ambulatory Visit

## 2024-01-03 DIAGNOSIS — G44011 Episodic cluster headache, intractable: Secondary | ICD-10-CM

## 2024-01-03 DIAGNOSIS — M542 Cervicalgia: Secondary | ICD-10-CM

## 2024-01-03 NOTE — Therapy (Signed)
 OUTPATIENT PHYSICAL THERAPY CERVICAL EVALUATION   Patient Name: Mary Macias MRN: [id number] DOB:Jul 08, 1966, 58 y.o., female Today's Date: 01/03/2024  END OF SESSION:  PT End of Session - 01/03/24 1552     Visit Number 3    Number of Visits 8    Date for PT Re-Evaluation 02/22/24    PT Start Time 1448    PT Stop Time 1527    PT Time Calculation (min) 39 min    Activity Tolerance Patient tolerated treatment well    Behavior During Therapy [facility] for tasks assessed/performed               Past Medical History:  Diagnosis Date   Allergy    Complication of anesthesia    slow to wake up   Diabetes mellitus without complication (HCC)    Pneumonia    PONV (postoperative nausea and vomiting)    Sleep apnea    cpap   Stable angina pectoris (HCC) 08/10/2017   Past Surgical History:  Procedure Laterality Date   CHOLECYSTECTOMY     COLONOSCOPY N/A 11/27/2022   Procedure: COLONOSCOPY;  Surgeon: Mary Collins, DO;  Location: [facility] ENDOSCOPY;  Service: Gastroenterology;  Laterality: N/A;   COLONOSCOPY WITH PROPOFOL N/A 07/13/2017   Procedure: COLONOSCOPY WITH PROPOFOL;  Surgeon: Mary Deem, MD;  Location: [facility] ENDOSCOPY;  Service: Endoscopy;  Laterality: N/A;   dental implants Right    NASAL SINUS SURGERY     OOPHORECTOMY     SHOULDER ARTHROSCOPY WITH ROTATOR CUFF REPAIR AND SUBACROMIAL DECOMPRESSION Right 11/22/2020   Procedure: Right shoulder arthroscopic rotator cuff repair, subacromial decompression, and biceps tenodesis ,distal clavicle excision;  Surgeon: Mary Kell, MD;  Location: [facility] ORS;  Service: Orthopedics;  Laterality: Right;   TOTAL VAGINAL HYSTERECTOMY     TUBAL LIGATION     Patient Active Problem List   Diagnosis Date Noted   Cervical spondylosis 11/23/2023   Acute recurrent maxillary sinusitis 10/15/2021   Adult ADHD 04/28/2021   Hx of total hysterectomy 12/15/2020   Nontraumatic complete tear of right rotator cuff 12/15/2020    OSA (obstructive sleep apnea) 02/02/2020   Hepatic steatosis 11/22/2019   Depression with anxiety 08/07/2019   Peripheral edema 02/08/2018   BMI 50.0-59.9, adult (HCC) 01/25/2018   Colon polyp, hyperplastic 07/16/2017   Diabetes mellitus treated with injections of non-insulin medication (HCC) 08/18/2015   Hyperlipidemia associated with type 2 diabetes mellitus (HCC) 07/08/2015   Environmental and seasonal allergies 07/08/2015   Avitaminosis D 07/08/2015   Beat, premature ventricular 02/11/2015    Past Medical History:  Diagnosis Date   Allergy    Complication of anesthesia    slow to wake up   Diabetes mellitus without complication (HCC)    Pneumonia    PONV (postoperative nausea and vomiting)    Sleep apnea    cpap   Stable angina pectoris (HCC) 08/10/2017   Past Surgical History:  Procedure Laterality Date   CHOLECYSTECTOMY     COLONOSCOPY N/A 11/27/2022   Procedure: COLONOSCOPY;  Surgeon: Mary Collins, DO;  Location: [facility] ENDOSCOPY;  Service: Gastroenterology;  Laterality: N/A;   COLONOSCOPY WITH PROPOFOL N/A 07/13/2017   Procedure: COLONOSCOPY WITH PROPOFOL;  Surgeon: Mary Deem, MD;  Location: [facility] ENDOSCOPY;  Service: Endoscopy;  Laterality: N/A;   dental implants Right    NASAL SINUS SURGERY     OOPHORECTOMY     SHOULDER ARTHROSCOPY WITH ROTATOR CUFF REPAIR AND SUBACROMIAL DECOMPRESSION Right 11/22/2020   Procedure: Right  shoulder arthroscopic rotator cuff repair, subacromial decompression, and biceps tenodesis ,distal clavicle excision;  Surgeon: Mary Kell, MD;  Location: [facility] ORS;  Service: Orthopedics;  Laterality: Right;   TOTAL VAGINAL HYSTERECTOMY     TUBAL LIGATION     Patient Active Problem List   Diagnosis Date Noted   Cervical spondylosis 11/23/2023   Acute recurrent maxillary sinusitis 10/15/2021   Adult ADHD 04/28/2021   Hx of total hysterectomy 12/15/2020   Nontraumatic complete tear of right rotator cuff 12/15/2020   OSA  (obstructive sleep apnea) 02/02/2020   Hepatic steatosis 11/22/2019   Depression with anxiety 08/07/2019   Peripheral edema 02/08/2018   BMI 50.0-59.9, adult (HCC) 01/25/2018   Colon polyp, hyperplastic 07/16/2017   Diabetes mellitus treated with injections of non-insulin medication (HCC) 08/18/2015   Hyperlipidemia associated with type 2 diabetes mellitus (HCC) 07/08/2015   Environmental and seasonal allergies 07/08/2015   Avitaminosis D 07/08/2015   Beat, premature ventricular 02/11/2015    PCP: Mary Edward MD  REFERRING PROVIDER: Kerri Perches MD  REFERRING DIAG: Cervical Spondylosis  THERAPY DIAG:  Headache, Neck tightness and neck pain.   Rationale for Evaluation and Treatment: Rehabilitation  ONSET DATE: 6 to 8 months ago.   SUBJECTIVE:                                                                                                                                                                                                         SUBJECTIVE STATEMENT: Pt reported: " My L index finger twitches, tremors and so when I say my Doctor. My CT from 2024 and thinks it is due to arthritis. Since my R shoulder surgery RCR and Bicep tenodesis my neck feels tight. Denies arm pain.   Hand dominance: Right  PERTINENT HISTORY:  As per EMR: [name]  reported of Index finger, which occurs mostly at night or when her arm is resting on a surface. The tremor is not constant and is sometimes accompanied by a sensation of vibration.  She denies any overt numbness or tingling in fingers, no weakness. She has a history of degenerative disc disease and has experienced a fall in the past, which resulted in a CT scan of her neck showing degenerative changes.  Intermittent unilateral finger tremor in the setting of multilevel cervical spinal arthropathy. Noted to be worse in the evenings. No clear correlation with medication use/changes however can be correlated to Strattera usage as  Tremor is listed as 1% to 5% incidence. Possible cervical spine involvement given unilateral nature and evening predominance. Started trial of  low-dose Gabapentin at bedtime, with option to increase up to three capsules as needed   PAIN:  Are you having pain? No, have tension headaches 2 x week from back of the neck in to front.   PRECAUTIONS: None  RED FLAGS: None     WEIGHT BEARING RESTRICTIONS: No  FALLS:  Has patient fallen in last 6 months? 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   PLOF: Independent  PATIENT GOALS: " I want to move my neck better and not feel like popping it so that I can play with my grand children and work without pain."   NEXT MD VISIT:  None this time.   OBJECTIVE:  Note: Objective measures were completed at Evaluation unless otherwise noted.  DIAGNOSTIC FINDINGS:   CT Scan:  Disc levels: Disc space narrowing with marginal osteophytes C3-4 through C5-6. 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    PATIENT SURVEYS:  NDI 11/50= 22% Quick Dash 14= 6.81 assessment HDI: 28  COGNITION: Overall cognitive status: Within functional limits for tasks assessed  SENSATION: WFL  REFLEX : L brachioradialis : absent  POSTURE: rounded shoulders, forward head, decreased lumbar lordosis, and decreased Cervical Lordosis  PALPATION: No pain elicited   CERVICAL ROM:   Active ROM A/PROM (deg) eval  Flexion WFL and tight  Extension wfl  Right lateral flexion WFL and tight  Left lateral flexion WFL and tight  Right rotation WFL and tight  Left rotation WFL and tight   (Blank rows = not tested)  UPPER EXTREMITY ROM: WNL  UPPER EXTREMITY MMT: 5/5  EXCEPT L Two Jaw Chuck grip 3/5    CERVICAL SPECIAL TESTS:  Neck flexor muscle endurance test: Negative, Spurling's test: Negative, Distraction test:  Positive ( pt felt relief)  and Sharp pursor's test: Negative  FUNCTIONAL TESTS:  None indicated  TREATMENT DATE: 01/03/2024                                                                                                                              Subjective: I have not done my HEP since I was here. I need to get better at it."  There ex: 31  mins UBE 3/3 F/B Manual traction to C/S and Fascia mobilization x 5 mins Corner door stretch 2 x 60 secs C/S stretch Flex/UT/Levator scap 2 x 30 secs each C/S isometrics in flex/ext/Rot/SB 2 1 x 10 reps 5 secs hold C/S strengthening in gravity eliminated position and YTB 2 x 10 reps each ( SB/Flex/Diag/Ext) Scapular retraction with #6 in prone 2 x 01 reps Shoulder Shrugs with Post roll and #6 2 x 01 reps. Wall push u[ 3 x 1- reps Median nerve glide 10 reps  HEP provided in H/O.    TA: 8 mins  HEP reviewed and provided with education of importance of proper technique and symptoms to  prevent adverse effect of the Exs. Pt advised to stretch before session.     PATIENT EDUCATION:  Education details: Posture education Person educated: Patient Education method: Explanation, Actor cues, and Verbal cues Education comprehension: verbalized understanding and needs further education  HOME EXERCISE PROGRAM: Median Nerve glide provided with good recall memory. 10 reps 4 to 5 times a day.  Access Code: [email or web address] URL: [email or web address] Date: 12/27/2023 Prepared by: Janet Berlin  Exercises - Doorway Pec Stretch at 90 Degrees Abduction  - 2 x daily - 7 x weekly - 1 sets - 2 reps - 60secs hold - Seated Cervical Flexion Stretch with Finger Support Behind Neck  - 7 x weekly - 1 sets - 10 reps - Seated Upper Trapezius Stretch  - 2 x daily - 7 x weekly - 1 sets - 4 reps - 30secs hold - Seated Levator Scapulae Stretch  - 2 x daily - 7 x weekly - 1 sets - 4 reps - 30secs hold - Seated Cervical Retraction  - 1 x daily - 7 x weekly - 1 sets  - 10 reps - Seated Isometric Cervical Extension  - 1 x daily - 7 x weekly - 1 sets - 10 reps - 5secs hold - Seated Isometric Cervical Flexion  - 1 x daily - 7 x weekly - 1 sets - 10 reps - 5secs hold - Seated Isometric Cervical Rotation  - 1 x daily - 7 x weekly - 1 sets - 10 reps - 5secs hold - Seated Isometric Cervical Sidebending  - 1 x daily - 7 x weekly - 1 sets - 10 reps - 5secs hold - Scapular Retraction with Resistance  - 1 x daily - 7 x weekly - 3 sets - 10 reps - Standing Shoulder Shrug and Retraction with Resistance  - 1 x daily - 7 x weekly - 3 sets - 10 reps  ASSESSMENT:  CLINICAL IMPRESSION: Pt presents with cont Tightness in C/S, decreased twitching in the Index finger, and without headache. Last headache was on the 25th this week. Pt advised to monitor headaches this week. Pt also educated about the  significance of HEP to improve rehab outcome.  PT provided C/S traction and fascia mobilization. Pt introduced to Stretches, isometrics and Strengthening exs. HEP reviewed and provided with education of importance of proper technique and symptoms to prevent adverse effect of the Exs. HEP reviewed. Pt challenged with strengthening exs to C/S and periscapular exs. . Pt tol tx well. A rough patch of skin with mild redness  palpated in Occiput and atlas area which is tender with palpation and remains sore.  PT advised Pt to show it ot MD during her next visit in May or call if needed.   Pt will benefit from Skilled PT up to 8 sessions to help pt achieve her goals.    OBJECTIVE IMPAIRMENTS: decreased mobility, decreased ROM, and decreased strength.   ACTIVITY LIMITATIONS: headaches and gripping with LUE  PARTICIPATION LIMITATIONS: cleaning and occupation   REHAB POTENTIAL: Good  CLINICAL DECISION MAKING: Stable/uncomplicated  EVALUATION COMPLEXITY: Low   GOALS: Goals reviewed with patient? Yes  SHORT TERM GOALS: Target date: 01/11/2024  Pt will demonstrate 100% compliance to HEP  in clinic to demonstrate long term benefit to rehab interventions.  Baseline:  To be provided next sessions  Goal status: INITIAL    LONG TERM GOALS: Target date: 02/22/2024  Pt will improve C/S ROM to AFL without tightness and pain to improve headaches and Median nerve  compression.  Baseline: WFL and tight ness at the end range.  Goal status: INITIAL  2.  Pt will demonstrate 0/10 pain with Median nerve tension test Baseline: Positive with tingling in index, middle finger.  Goal status: INITIAL  3.  Pt will demonstrate <5/50 score on NDI to improve QOL Baseline: 11/50 Goal status: INITIAL  4.  Pt will improve Quick Dash score to 11 to demonstrate improve UE functions Baseline: 14 Goal status: INITIAL  5.  Pt will reduce HDI score by 50 % to demonstrate improved QOL.  Baseline: 28= 37% disability Goal status: INITIAL  6. Pt will improve 2 jaw chuck strength to 3+/5 to demonstrate improve UE functions.              Baseline: 3/5             Goal Status: INITIAL    PLAN:  PT FREQUENCY: 1-2x/week  PT DURATION: 8 weeks  PLANNED INTERVENTIONS: 97110-Therapeutic exercises, 97530- Therapeutic activity, [id number]- Neuromuscular re-education, 97535- Self Care, [id number]- Manual therapy, and [id number]- Electrical stimulation (unattended)  PLAN FOR NEXT SESSION: Median nerve glide review, C/S manual traction/ mechanical traction, C/S stretches, Neck isometrics  Janet Berlin PT DPT 3:54 PM,01/03/24

## 2024-01-10 ENCOUNTER — Ambulatory Visit

## 2024-01-15 ENCOUNTER — Ambulatory Visit: Attending: Family Medicine

## 2024-01-15 DIAGNOSIS — M542 Cervicalgia: Secondary | ICD-10-CM | POA: Diagnosis present

## 2024-01-15 NOTE — Therapy (Unsigned)
 OUTPATIENT PHYSICAL THERAPY CERVICAL TREATMENT  Patient Name: Mary [facility] MRN: [id number] DOB:08-17-1966, 58 y.o., female Today's Date: 01/16/2024  END OF SESSION:  PT End of Session - 01/15/24 1443     Visit Number 4    Number of Visits 9    Date for PT Re-Evaluation 02/22/24    PT Start Time 1445    PT Stop Time 1530    PT Time Calculation (min) 45 min    Activity Tolerance Patient tolerated treatment well    Behavior During Therapy [facility] for tasks assessed/performed            Past Medical History:  Diagnosis Date   Allergy    Complication of anesthesia    slow to wake up   Diabetes mellitus without complication (HCC)    Pneumonia    PONV (postoperative nausea and vomiting)    Sleep apnea    cpap   Stable angina pectoris (HCC) 08/10/2017   Past Surgical History:  Procedure Laterality Date   CHOLECYSTECTOMY     COLONOSCOPY N/A 11/27/2022   Procedure: COLONOSCOPY;  Surgeon: Mary Collins, DO;  [facility]: [facility];  Service: Gastroenterology;  Laterality: N/A;   COLONOSCOPY WITH PROPOFOL N/A 07/13/2017   Procedure: COLONOSCOPY WITH PROPOFOL;  Surgeon: Mary Deem, [facility];  Location: [facility];  Service: [facility];  Laterality: N/A;   dental implants Right    NASAL SINUS SURGERY     OOPHORECTOMY     SHOULDER ARTHROSCOPY WITH ROTATOR CUFF REPAIR AND SUBACROMIAL DECOMPRESSION Right 11/22/2020   Procedure: Right shoulder arthroscopic rotator cuff repair, subacromial decompression, and biceps tenodesis ,distal clavicle excision;  Surgeon: Mary Kell, [facility];  Location: [facility];  Service: Orthopedics;  Laterality: Right;   TOTAL VAGINAL HYSTERECTOMY     TUBAL LIGATION     Patient Active Problem List   Diagnosis Date Noted   Cervical spondylosis 11/23/2023   Acute recurrent maxillary sinusitis 10/15/2021   Adult ADHD 04/28/2021   Hx of total hysterectomy 12/15/2020   Nontraumatic complete tear of right rotator cuff 12/15/2020   OSA  (obstructive sleep apnea) 02/02/2020   Hepatic steatosis 11/22/2019   Depression with anxiety 08/07/2019   Peripheral edema 02/08/2018   BMI 50.0-59.9, adult (HCC) 01/25/2018   Colon polyp, hyperplastic 07/16/2017   Diabetes mellitus treated with injections of non-insulin medication (HCC) 08/18/2015   Hyperlipidemia associated with type 2 diabetes mellitus (HCC) 07/08/2015   Environmental and seasonal allergies 07/08/2015   Avitaminosis D 07/08/2015   Beat, premature ventricular 02/11/2015   PCP: Mary Edward [facility]  REFERRING PROVIDER: Kerri Perches [facility]  REFERRING DIAG: Cervical Spondylosis  THERAPY DIAG: Headache, Neck tightness and neck pain.   Rationale for Evaluation and Treatment: Rehabilitation  ONSET DATE: 6 to 8 months ago.   FROM INITIAL EVALUATION SUBJECTIVE:  SUBJECTIVE STATEMENT: Pt reported: " My L index finger twitches, tremors and so when I say my Doctor. My CT from 2024 and thinks it is due to arthritis. Since my R shoulder surgery RCR and Bicep tenodesis my neck feels tight. Denies arm pain. Hand dominance: Right  PERTINENT HISTORY:  As per EMR: Mary [facility]  reported of Index finger, which occurs mostly at night or when her arm is resting on a surface. The tremor is not constant and is sometimes accompanied by a sensation of vibration.  She denies any overt numbness or tingling in fingers, no weakness. She has a history of degenerative disc disease and has experienced a fall in the past, which resulted in a CT scan of her neck showing degenerative changes. Intermittent unilateral finger tremor in the setting of multilevel cervical spinal arthropathy. Noted to be worse in the evenings. No clear correlation with medication use/changes however can be correlated to  Strattera usage as Tremor is listed as 1% to 5% incidence. Possible cervical spine involvement given unilateral nature and evening predominance. Started trial of low-dose Gabapentin at bedtime, with option to increase up to three capsules as needed   PAIN: Are you having pain? No, have tension headaches 2 x week from back of the neck in to front.   PRECAUTIONS: None  RED FLAGS: None    WEIGHT BEARING RESTRICTIONS: No  FALLS: Has patient fallen in last 6 months? 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   PLOF: Independent  PATIENT GOALS: " I want to move my neck better and not feel like popping it so that I can play with my grand children and work without pain."   NEXT [facility] VISIT:  None this time.    OBJECTIVE:  Note: Objective measures were completed at Evaluation unless otherwise noted.  DIAGNOSTIC FINDINGS:  CT Scan: Disc levels: Disc space narrowing with marginal osteophytes C3-4 through C5-6. 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    PATIENT SURVEYS:  NDI 11/50= 22% Quick Dash 14= 6.81 assessment HDI: 28  COGNITION: Overall cognitive status: Within functional limits for tasks assessed  SENSATION: WFL  REFLEX : L brachioradialis : absent  POSTURE: rounded shoulders, forward head, decreased lumbar lordosis, and decreased Cervical Lordosis  PALPATION: No pain elicited   CERVICAL ROM:   Active ROM A/PROM (deg) eval  Flexion WFL and tight  Extension wfl  Right lateral flexion WFL and tight  Left lateral flexion WFL and tight  Right rotation WFL and tight  Left rotation WFL and tight   (Blank rows = not tested)  UPPER EXTREMITY ROM: WNL  UPPER EXTREMITY MMT: 5/5  EXCEPT L Two Jaw Chuck grip 3/5  CERVICAL SPECIAL TESTS:  Neck flexor muscle endurance test: Negative, Spurling's test: Negative, Distraction  test: Positive ( pt felt relief)  and Sharp pursor's test: Negative  FUNCTIONAL TESTS: None indicated   TREATMENT DATE: 01/15/2024  Subjective: Pt reports she is doing well today. She continues with neck stiffness. No specific resting pain reported upon arrival. No specific questions or concerns currently.   Pain: Denies resting pain but reports stiffness;   Ther-ex  UBE 5 minutes total (2.5 forward/2.5 backwards);   AROM AROM (Normal range in degrees) AROM  Cervical  Flexion (50) 55  Extension (80) 34  Right lateral flexion (45) 25  Left lateral flexion (45) 45  Right rotation (85) 35  Left rotation (85) 60  (* = pain; Blank rows = not tested)  Post-needling cervical rotation: R: 45; L: 60;  Cervical isometrics for rotation, lateral flexion, and protraction 5s hold x 10 each; Repeated cervical retractions 5s hold x 10;   Manual Therapy  R upper trap and L lateral flexion stretches 2 x 60s each; Manual cervical traction 10s on/10s off x 5; Supine STM to R upper trap and cervical paraspinals;   Trigger Point Dry Needling Initial Treatment: Pt instructed on Dry Needling rational, procedures, and possible side effects. Pt instructed to expect mild to moderate muscle soreness later in the day and/or into the next day.  Pt instructed in methods to reduce muscle soreness. Pt instructed to continue prescribed HEP. Because Dry Needling was performed over or adjacent to a lung field, pt was educated on S/S of pneumothorax and to seek immediate medical attention should they occur.  Patient was educated on signs and symptoms of infection and other risk factors and advised to seek medical attention should they occur.  Patient verbalized understanding of these instructions and education.   Patient Verbal Consent Given: Yes Education Handout Provided: No  reviewed verbally Muscles Treated: In supine using clean technique TDN performed to R upper trap with 2, 0.30 x 60 single needle placements (anterior approach) with local twitch response (LTR). Pistoning technique utilized. Electrical Stimulation Performed: No Treatment Response/Outcome: Soreness    PATIENT EDUCATION:  Education details: TDN, exercise form/technique Person educated: Patient Education method: Explanation, Tactile cues, and Verbal cues Education comprehension: verbalized understanding and needs further education  HOME EXERCISE PROGRAM: Median Nerve glide provided with good recall memory. 10 reps 4 to 5 times a day.  Access Code: [email or web address] URL: [email or web address] Date: 12/27/2023 Prepared by: Janet Berlin  Exercises - Doorway Pec Stretch at 90 Degrees Abduction  - 2 x daily - 7 x weekly - 1 sets - 2 reps - 60secs hold - Seated Cervical Flexion Stretch with Finger Support Behind Neck  - 7 x weekly - 1 sets - 10 reps - Seated Upper Trapezius Stretch  - 2 x daily - 7 x weekly - 1 sets - 4 reps - 30secs hold - Seated Levator Scapulae Stretch  - 2 x daily - 7 x weekly - 1 sets - 4 reps - 30secs hold - Seated Cervical Retraction  - 1 x daily - 7 x weekly - 1 sets - 10 reps - Seated Isometric Cervical Extension  - 1 x daily - 7 x weekly - 1 sets - 10 reps - 5secs hold - Seated Isometric Cervical Flexion  - 1 x daily - 7 x weekly - 1 sets - 10 reps - 5secs hold - Seated Isometric Cervical Rotation  - 1 x daily - 7 x weekly - 1 sets - 10 reps - 5secs hold - Seated Isometric Cervical Sidebending  - 1 x daily - 7 x weekly - 1 sets - 10 reps - 5secs hold - Scapular Retraction with Resistance  - 1 x  daily - 7 x weekly - 3 sets - 10 reps - Standing Shoulder Shrug and Retraction with Resistance  - 1 x daily - 7 x weekly - 3 sets - 10 reps  ASSESSMENT:  CLINICAL IMPRESSION: Performed cervical measurements with notable lack of R lateral flexion and R rotation. Introduced  TDN and immediate improvement noted in ROM. Progressed stretches and cervical isometric strengthening. Will assess response to TDN at next session and if appropriate will consider including suboccipitals. Pt encouraged to bring her TENS unit so we can instruct how to use for neck tension. Pt will benefit from PT services to address deficits in strength, range of motion, and pain in order to return to full function at home.   OBJECTIVE IMPAIRMENTS: decreased mobility, decreased ROM, and decreased strength.   ACTIVITY LIMITATIONS: headaches and gripping with LUE  PARTICIPATION LIMITATIONS: cleaning and occupation   REHAB POTENTIAL: Good  CLINICAL DECISION MAKING: Stable/uncomplicated  EVALUATION COMPLEXITY: Low   GOALS: Goals reviewed with patient? Yes  SHORT TERM GOALS: Target date: 01/11/2024  Pt will demonstrate 100% compliance to HEP in clinic to demonstrate long term benefit to rehab interventions.  Baseline:  To be provided next sessions  Goal status: INITIAL    LONG TERM GOALS: Target date: 02/22/2024  Pt will improve C/S ROM to AFL without tightness and pain to improve headaches and Median nerve compression.  Baseline: WFL and tight ness at the end range.  Goal status: INITIAL  2.  Pt will demonstrate 0/10 pain with Median nerve tension test Baseline: Positive with tingling in index, middle finger.  Goal status: INITIAL  3.  Pt will demonstrate <5/50 score on NDI to improve QOL Baseline: 11/50 Goal status: INITIAL  4.  Pt will improve Quick Dash score to 11 to demonstrate improve UE functions Baseline: 14 Goal status: INITIAL  5.  Pt will reduce HDI score by 50 % to demonstrate improved QOL.  Baseline: 28= 37% disability Goal status: INITIAL  6. Pt will improve 2 jaw chuck strength to 3+/5 to demonstrate improve UE functions.              Baseline: 3/5             Goal Status: INITIAL    PLAN:  PT FREQUENCY: 1-2x/week  PT DURATION: 8 weeks  PLANNED  INTERVENTIONS: 97110-Therapeutic exercises, 97530- Therapeutic activity, [id number]- Neuromuscular re-education, 97535- Self Care, 16109- Manual therapy, and G0283- Electrical stimulation (unattended)  PLAN FOR NEXT SESSION: Median nerve glide review, C/S manual traction/ mechanical traction, C/S stretches, Neck isometrics  Sharalyn Ink Uziel Covault PT, DPT, GCS  10:29 AM,01/16/24

## 2024-01-16 NOTE — Therapy (Unsigned)
 OUTPATIENT PHYSICAL THERAPY CERVICAL TREATMENT  Patient Name: Mary [facility] MRN: [id number] DOB:02-05-1966, 58 y.o., female Today's Date: 01/16/2024  END OF SESSION:   Past Medical History:  Diagnosis Date   Allergy    Complication of anesthesia    slow to wake up   Diabetes mellitus without complication (HCC)    Pneumonia    PONV (postoperative nausea and vomiting)    Sleep apnea    cpap   Stable angina pectoris (HCC) 08/10/2017   Past Surgical History:  Procedure Laterality Date   CHOLECYSTECTOMY     COLONOSCOPY N/A 11/27/2022   Procedure: COLONOSCOPY;  Surgeon: Jaynie Collins, DO;  Location: [facility];  Service: Gastroenterology;  Laterality: N/A;   COLONOSCOPY WITH PROPOFOL N/A 07/13/2017   Procedure: COLONOSCOPY WITH PROPOFOL;  Surgeon: Christena Deem, MD;  Location: [facility]COPY;  Service: Endoscopy;  Laterality: N/A;   dental implants Right    NASAL SINUS SURGERY     OOPHORECTOMY     SHOULDER ARTHROSCOPY WITH ROTATOR CUFF REPAIR AND SUBACROMIAL DECOMPRESSION Right 11/22/2020   Procedure: Right shoulder arthroscopic rotator cuff repair, subacromial decompression, and biceps tenodesis ,distal clavicle excision;  Surgeon: Signa Kell, MD;  Location: [facility];  Service: Orthopedics;  Laterality: Right;   TOTAL VAGINAL HYSTERECTOMY     TUBAL LIGATION     Patient Active Problem List   Diagnosis Date Noted   Cervical spondylosis 11/23/2023   Acute recurrent maxillary sinusitis 10/15/2021   Adult ADHD 04/28/2021   Hx of total hysterectomy 12/15/2020   Nontraumatic complete tear of right rotator cuff 12/15/2020   OSA (obstructive sleep apnea) 02/02/2020   Hepatic steatosis 11/22/2019   Depression with anxiety 08/07/2019   Peripheral edema 02/08/2018   BMI 50.0-59.9, adult (HCC) 01/25/2018   Colon polyp, hyperplastic 07/16/2017   Diabetes mellitus treated with injections of non-insulin medication (HCC) 08/18/2015   Hyperlipidemia associated  with type 2 diabetes mellitus (HCC) 07/08/2015   Environmental and seasonal allergies 07/08/2015   Avitaminosis D 07/08/2015   Beat, premature ventricular 02/11/2015   PCP: Bari Edward MD  REFERRING PROVIDER: Kerri Perches MD  REFERRING DIAG: Cervical Spondylosis  THERAPY DIAG: Headache, Neck tightness and neck pain.   Rationale for Evaluation and Treatment: Rehabilitation  ONSET DATE: 6 to 8 months ago.   FROM INITIAL EVALUATION SUBJECTIVE:  SUBJECTIVE STATEMENT: Pt reported: " My L index finger twitches, tremors and so when I say my Doctor. My CT from 2024 and thinks it is due to arthritis. Since my R shoulder surgery RCR and Bicep tenodesis my neck feels tight. Denies arm pain. Hand dominance: Right  PERTINENT HISTORY:  As per EMR: [name]  reported of Index finger, which occurs mostly at night or when her arm is resting on a surface. The tremor is not constant and is sometimes accompanied by a sensation of vibration.  She denies any overt numbness or tingling in fingers, no weakness. She has a history of degenerative disc disease and has experienced a fall in the past, which resulted in a CT scan of her neck showing degenerative changes. Intermittent unilateral finger tremor in the setting of multilevel cervical spinal arthropathy. Noted to be worse in the evenings. No clear correlation with medication use/changes however can be correlated to Strattera usage as Tremor is listed as 1% to 5% incidence. Possible cervical spine involvement given unilateral nature and evening predominance. Started trial of low-dose Gabapentin at bedtime, with option to increase up to three capsules as needed   PAIN: Are you having pain? No, have tension headaches 2 x week from back of the neck in  to front.   PRECAUTIONS: None  RED FLAGS: None    WEIGHT BEARING RESTRICTIONS: No  FALLS: Has patient fallen in last 6 months? 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   PLOF: Independent  PATIENT GOALS: " I want to move my neck better and not feel like popping it so that I can play with my grand children and work without pain."   NEXT MD VISIT:  None this time.    OBJECTIVE:  Note: Objective measures were completed at Evaluation unless otherwise noted.  DIAGNOSTIC FINDINGS:  CT Scan: Disc levels: Disc space narrowing with marginal osteophytes C3-4 through C5-6. 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    PATIENT SURVEYS:  NDI 11/50= 22% Quick Dash 14= 6.81 assessment HDI: 28  COGNITION: Overall cognitive status: Within functional limits for tasks assessed  SENSATION: WFL  REFLEX : L brachioradialis : absent  POSTURE: rounded shoulders, forward head, decreased lumbar lordosis, and decreased Cervical Lordosis  PALPATION: No pain elicited   CERVICAL ROM:   Active ROM A/PROM (deg) eval  Flexion WFL and tight  Extension wfl  Right lateral flexion WFL and tight  Left lateral flexion WFL and tight  Right rotation WFL and tight  Left rotation WFL and tight   (Blank rows = not tested)  UPPER EXTREMITY ROM: WNL  UPPER EXTREMITY MMT: 5/5  EXCEPT L Two Jaw Chuck grip 3/5  CERVICAL SPECIAL TESTS:  Neck flexor muscle endurance test: Negative, Spurling's test: Negative, Distraction test: Positive ( pt felt relief)  and Sharp pursor's test: Negative  FUNCTIONAL TESTS: None indicated   TREATMENT DATE: 01/15/2024  Subjective: Pt reports she is doing well today. She continues with neck  stiffness. No specific resting pain reported upon arrival. No specific questions or concerns currently.   Pain: Denies resting pain but reports stiffness;   Ther-ex  UBE 5 minutes total (2.5 forward/2.5 backwards);   AROM AROM (Normal range in degrees) AROM  Cervical  Flexion (50) 55  Extension (80) 34  Right lateral flexion (45) 25  Left lateral flexion (45) 45  Right rotation (85) 35  Left rotation (85) 60  (* = pain; Blank rows = not tested)  Post-needling cervical rotation: R: 45; L: 60;  Cervical isometrics for rotation, lateral flexion, and protraction 5s hold x 10 each; Repeated cervical retractions 5s hold x 10;   Manual Therapy  R upper trap and L lateral flexion stretches 2 x 60s each; Manual cervical traction 10s on/10s off x 5; Supine STM to R upper trap and cervical paraspinals;   Trigger Point Dry Needling Initial Treatment: Pt instructed on Dry Needling rational, procedures, and possible side effects. Pt instructed to expect mild to moderate muscle soreness later in the day and/or into the next day.  Pt instructed in methods to reduce muscle soreness. Pt instructed to continue prescribed HEP. Because Dry Needling was performed over or adjacent to a lung field, pt was educated on S/S of pneumothorax and to seek immediate medical attention should they occur.  Patient was educated on signs and symptoms of infection and other risk factors and advised to seek medical attention should they occur.  Patient verbalized understanding of these instructions and education.   Patient Verbal Consent Given: Yes Education Handout Provided: No reviewed verbally Muscles Treated: In supine using clean technique TDN performed to R upper trap with 2, 0.30 x 60 single needle placements (anterior approach) with local twitch response (LTR). Pistoning technique utilized. Electrical Stimulation Performed: No Treatment Response/Outcome: Soreness    PATIENT EDUCATION:  Education  details: TDN, exercise form/technique Person educated: Patient Education method: Explanation, Tactile cues, and Verbal cues Education comprehension: verbalized understanding and needs further education  HOME EXERCISE PROGRAM: Median Nerve glide provided with good recall memory. 10 reps 4 to 5 times a day.  Access Code: [email or web address] URL: [email or web address] Date: 12/27/2023 Prepared by: Janet Berlin  Exercises - Doorway Pec Stretch at 90 Degrees Abduction  - 2 x daily - 7 x weekly - 1 sets - 2 reps - 60secs hold - Seated Cervical Flexion Stretch with Finger Support Behind Neck  - 7 x weekly - 1 sets - 10 reps - Seated Upper Trapezius Stretch  - 2 x daily - 7 x weekly - 1 sets - 4 reps - 30secs hold - Seated Levator Scapulae Stretch  - 2 x daily - 7 x weekly - 1 sets - 4 reps - 30secs hold - Seated Cervical Retraction  - 1 x daily - 7 x weekly - 1 sets - 10 reps - Seated Isometric Cervical Extension  - 1 x daily - 7 x weekly - 1 sets - 10 reps - 5secs hold - Seated Isometric Cervical Flexion  - 1 x daily - 7 x weekly - 1 sets - 10 reps - 5secs hold - Seated Isometric Cervical Rotation  - 1 x daily - 7 x weekly - 1 sets - 10 reps - 5secs hold - Seated Isometric Cervical Sidebending  - 1 x daily - 7 x weekly - 1 sets - 10 reps - 5secs hold - Scapular Retraction with Resistance  - 1  x daily - 7 x weekly - 3 sets - 10 reps - Standing Shoulder Shrug and Retraction with Resistance  - 1 x daily - 7 x weekly - 3 sets - 10 reps  ASSESSMENT:  CLINICAL IMPRESSION: Performed cervical measurements with notable lack of R lateral flexion and R rotation. Introduced TDN and immediate improvement noted in ROM. Progressed stretches and cervical isometric strengthening. Will assess response to TDN at next session and if appropriate will consider including suboccipitals. Pt encouraged to bring her TENS unit so we can instruct how to use for neck tension. Pt will benefit from PT services to address  deficits in strength, range of motion, and pain in order to return to full function at home.   OBJECTIVE IMPAIRMENTS: decreased mobility, decreased ROM, and decreased strength.   ACTIVITY LIMITATIONS: headaches and gripping with LUE  PARTICIPATION LIMITATIONS: cleaning and occupation   REHAB POTENTIAL: Good  CLINICAL DECISION MAKING: Stable/uncomplicated  EVALUATION COMPLEXITY: Low   GOALS: Goals reviewed with patient? Yes  SHORT TERM GOALS: Target date: 01/11/2024  Pt will demonstrate 100% compliance to HEP in clinic to demonstrate long term benefit to rehab interventions.  Baseline:  To be provided next sessions  Goal status: INITIAL    LONG TERM GOALS: Target date: 02/22/2024  Pt will improve C/S ROM to AFL without tightness and pain to improve headaches and Median nerve compression.  Baseline: WFL and tight ness at the end range.  Goal status: INITIAL  2.  Pt will demonstrate 0/10 pain with Median nerve tension test Baseline: Positive with tingling in index, middle finger.  Goal status: INITIAL  3.  Pt will demonstrate <5/50 score on NDI to improve QOL Baseline: 11/50 Goal status: INITIAL  4.  Pt will improve Quick Dash score to 11 to demonstrate improve UE functions Baseline: 14 Goal status: INITIAL  5.  Pt will reduce HDI score by 50 % to demonstrate improved QOL.  Baseline: 28= 37% disability Goal status: INITIAL  6. Pt will improve 2 jaw chuck strength to 3+/5 to demonstrate improve UE functions.              Baseline: 3/5             Goal Status: INITIAL    PLAN:  PT FREQUENCY: 1-2x/week  PT DURATION: 8 weeks  PLANNED INTERVENTIONS: 97110-Therapeutic exercises, 97530- Therapeutic activity, [id number]- Neuromuscular re-education, 97535- Self Care, 40981- Manual therapy, and G0283- Electrical stimulation (unattended)  PLAN FOR NEXT SESSION: Median nerve glide review, C/S manual traction/ mechanical traction, C/S stretches, Neck isometrics  Kelcey Korus D  Aerion Bagdasarian PT, DPT, GCS  10:14 PM,01/16/24

## 2024-01-17 ENCOUNTER — Ambulatory Visit

## 2024-01-17 DIAGNOSIS — M542 Cervicalgia: Secondary | ICD-10-CM

## 2024-01-18 ENCOUNTER — Encounter

## 2024-01-22 ENCOUNTER — Ambulatory Visit

## 2024-01-22 DIAGNOSIS — M542 Cervicalgia: Secondary | ICD-10-CM | POA: Diagnosis not present

## 2024-01-22 NOTE — Therapy (Signed)
 OUTPATIENT PHYSICAL THERAPY CERVICAL TREATMENT  Patient Name: Mary Macias MRN: [id number] DOB:Mar 20, 1966, 58 y.o., female Today's Date: 01/23/2024  END OF SESSION:  PT End of Session - 01/22/24 1529     Visit Number 6    Number of Visits 9    Date for PT Re-Evaluation 02/22/24    PT Start Time 1530    PT Stop Time 1615    PT Time Calculation (min) 45 min    Activity Tolerance Patient tolerated treatment well    Behavior During Therapy Mary Macias for tasks assessed/performed            Past Medical History:  Diagnosis Date   Allergy    Complication of anesthesia    slow to wake up   Diabetes mellitus without complication (HCC)    Pneumonia    PONV (postoperative nausea and vomiting)    Sleep apnea    cpap   Stable angina pectoris (HCC) 08/10/2017   Past Surgical History:  Procedure Laterality Date   CHOLECYSTECTOMY     COLONOSCOPY N/A 11/27/2022   Procedure: COLONOSCOPY;  Surgeon: Mary Buckle, DO;  Location: [facility] ENDOSCOPY;  Service: Gastroenterology;  Laterality: N/A;   COLONOSCOPY WITH PROPOFOL N/A 07/13/2017   Procedure: COLONOSCOPY WITH PROPOFOL;  Surgeon: Mary Fly, MD;  Location: [facility] ENDOSCOPY;  Service: Endoscopy;  Laterality: N/A;   dental implants Right    NASAL SINUS SURGERY     OOPHORECTOMY     SHOULDER ARTHROSCOPY WITH ROTATOR CUFF REPAIR AND SUBACROMIAL DECOMPRESSION Right 11/22/2020   Procedure: Right shoulder arthroscopic rotator cuff repair, subacromial decompression, and biceps tenodesis ,distal clavicle excision;  Surgeon: Mary Rota, MD;  Location: [facility] ORS;  Service: Orthopedics;  Laterality: Right;   TOTAL VAGINAL HYSTERECTOMY     TUBAL LIGATION     Patient Active Problem List   Diagnosis Date Noted   Cervical spondylosis 11/23/2023   Acute recurrent maxillary sinusitis 10/15/2021   Adult ADHD 04/28/2021   Hx of total hysterectomy 12/15/2020   Nontraumatic complete tear of right rotator cuff 12/15/2020   OSA  (obstructive sleep apnea) 02/02/2020   Hepatic steatosis 11/22/2019   Depression with anxiety 08/07/2019   Peripheral edema 02/08/2018   BMI 50.0-59.9, adult (HCC) 01/25/2018   Colon polyp, hyperplastic 07/16/2017   Diabetes mellitus treated with injections of non-insulin medication (HCC) 08/18/2015   Hyperlipidemia associated with type 2 diabetes mellitus (HCC) 07/08/2015   Environmental and seasonal allergies 07/08/2015   Avitaminosis D 07/08/2015   Beat, premature ventricular 02/11/2015   PCP: Mary Melter MD  REFERRING PROVIDER: Orpah Blackwater MD  REFERRING DIAG: Cervical Spondylosis  THERAPY DIAG: Headache, Neck tightness and neck pain.   Rationale for Evaluation and Treatment: Rehabilitation  ONSET DATE: 6 to 8 months ago.   FROM INITIAL EVALUATION SUBJECTIVE:  SUBJECTIVE STATEMENT: Pt reported: " My L index finger twitches, tremors and so when I say my Doctor. My CT from 2024 and thinks it is due to arthritis. Since my R shoulder surgery RCR and Bicep tenodesis my neck feels tight. Denies arm pain. Hand dominance: Right  PERTINENT HISTORY:  As per EMR: Mary Macias  reported of Index finger, which occurs mostly at night or when her arm is resting on a surface. The tremor is not constant and is sometimes accompanied by a sensation of vibration.  She denies any overt numbness or tingling in fingers, no weakness. She has a history of degenerative disc disease and has experienced a fall in the past, which resulted in a CT scan of her neck showing degenerative changes. Intermittent unilateral finger tremor in the setting of multilevel cervical spinal arthropathy. Noted to be worse in the evenings. No clear correlation with medication use/changes however can be correlated to  Strattera usage as Tremor is listed as 1% to 5% incidence. Possible cervical spine involvement given unilateral nature and evening predominance. Started trial of low-dose Gabapentin at bedtime, with option to increase up to three capsules as needed   PAIN: Are you having pain? No, have tension headaches 2 x week from back of the neck in to front.   PRECAUTIONS: None  RED FLAGS: None    WEIGHT BEARING RESTRICTIONS: No  FALLS: Has patient fallen in last 6 months? 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   PLOF: Independent  PATIENT GOALS: " I want to move my neck better and not feel like popping it so that I can play with my grand children and work without pain."   NEXT MD VISIT:  None this time.    OBJECTIVE:  Note: Objective measures were completed at Evaluation unless otherwise noted.  DIAGNOSTIC FINDINGS:  CT Scan: Disc levels: Disc space narrowing with marginal osteophytes C3-4 through C5-6. 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    PATIENT SURVEYS:  NDI 11/50= 22% Quick Dash 14= 6.81 assessment HDI: 28  COGNITION: Overall cognitive status: Within functional limits for tasks assessed  SENSATION: WFL  REFLEX : L brachioradialis : absent  POSTURE: rounded shoulders, forward head, decreased lumbar lordosis, and decreased Cervical Lordosis  PALPATION: No pain elicited   CERVICAL ROM:   01/15/24: AROM AROM (Normal range in degrees) AROM  Cervical  Flexion (50) 55  Extension (80) 34  Right lateral flexion (45) 25  Left lateral flexion (45) 45  Right rotation (85) 35  Left rotation (85) 60  (* = pain; Blank rows = not tested)  UPPER EXTREMITY ROM: WNL  UPPER EXTREMITY MMT: 5/5  EXCEPT L Two Jaw Chuck grip 3/5  CERVICAL SPECIAL TESTS:  Neck flexor muscle endurance test: Negative, Spurling's test: Negative,  Distraction test: Positive ( pt felt relief)  and Sharp pursor's test: Negative  FUNCTIONAL TESTS: None indicated   TREATMENT DATE: 01/22/2024  Subjective: Pt reports she is doing well today. She continues with neck stiffness but has noticed improvement with her home TENS unit. No specific resting pain reported upon arrival. No specific questions or concerns currently.   Pain: Denies resting pain but reports stiffness;   Ther-ex  UBE 5 minutes total (2.5 forward/2.5 backwards) during interval history; Nautilus lat pull downs 75# x 10, 95# x 10, 110# x 10; Nautilus rows 50# x 12, 60# 2 x 10; Cervical manually resisted rotation x 5 each direction bilaterally;   Manual Therapy  Supine R upper trap, L lateral flexion, and R levator scapulae stretches 2 x 45s each; Supine STM to R upper trap and cervical paraspinals;   Trigger Point Dry Needling, unbilled Subsequent Treatment: Instructions provided previously at initial dry needling treatment.   Patient Verbal Consent Given: Yes Education Handout Provided: No reviewed verbally Muscles Treated: In supine using clean technique TDN performed to R upper trap with 2, 0.30 x 60 single needle placements (anterior approach) with local twitch response (LTR) during both placements. Also performed 2 additional placements in R upper trap with pt in prone using posterior approach with LTR noted during all placements. While in prone performed 2, 0.25 x 40 single needle placements to bilateral suboccipitals (one on each side). Pistoning technique utilized. Electrical Stimulation Performed: No Treatment Response/Outcome: Soreness    PATIENT EDUCATION:  Education details: TDN, exercise form/technique Person educated: Patient Education method: Explanation, Tactile cues, and Verbal cues Education comprehension: verbalized  understanding and needs further education   HOME EXERCISE PROGRAM: Median Nerve glide provided with good recall memory. 10 reps 4 to 5 times a day.  Access Code: [email or web address] URL: [email or web address] Date: 12/27/2023 Prepared by: Ashley Lawrence  Exercises - Doorway Pec Stretch at 90 Degrees Abduction  - 2 x daily - 7 x weekly - 1 sets - 2 reps - 60secs hold - Seated Cervical Flexion Stretch with Finger Support Behind Neck  - 7 x weekly - 1 sets - 10 reps - Seated Upper Trapezius Stretch  - 2 x daily - 7 x weekly - 1 sets - 4 reps - 30secs hold - Seated Levator Scapulae Stretch  - 2 x daily - 7 x weekly - 1 sets - 4 reps - 30secs hold - Seated Cervical Retraction  - 1 x daily - 7 x weekly - 1 sets - 10 reps - Seated Isometric Cervical Extension  - 1 x daily - 7 x weekly - 1 sets - 10 reps - 5secs hold - Seated Isometric Cervical Flexion  - 1 x daily - 7 x weekly - 1 sets - 10 reps - 5secs hold - Seated Isometric Cervical Rotation  - 1 x daily - 7 x weekly - 1 sets - 10 reps - 5secs hold - Seated Isometric Cervical Sidebending  - 1 x daily - 7 x weekly - 1 sets - 10 reps - 5secs hold - Scapular Retraction with Resistance  - 1 x daily - 7 x weekly - 3 sets - 10 reps - Standing Shoulder Shrug and Retraction with Resistance  - 1 x daily - 7 x weekly - 3 sets - 10 reps   ASSESSMENT:  CLINICAL IMPRESSION: TDN repeated and performed additional placements during session today. Progressed stretches and cervical strengthening. Introduced Multimedia programmer with Conservation officer, nature. Plan to progress strengthening at future sessions. Pt advised to continue HEP and follow-up as scheduled. She will benefit from PT services to address deficits in strength, range of motion, and  pain in order to return to full function at home.   OBJECTIVE IMPAIRMENTS: decreased mobility, decreased ROM, and decreased strength.   ACTIVITY LIMITATIONS: headaches and gripping with LUE  PARTICIPATION  LIMITATIONS: cleaning and occupation   REHAB POTENTIAL: Good  CLINICAL DECISION MAKING: Stable/uncomplicated  EVALUATION COMPLEXITY: Low   GOALS: Goals reviewed with patient? Yes  SHORT TERM GOALS: Target date: 01/11/2024  Pt will demonstrate 100% compliance to HEP in clinic to demonstrate long term benefit to rehab interventions.  Baseline:  To be provided next sessions  Goal status: INITIAL    LONG TERM GOALS: Target date: 02/22/2024  Pt will improve C/S ROM to AFL without tightness and pain to improve headaches and Median nerve compression.  Baseline: WFL and tight ness at the end range.  Goal status: INITIAL  2.  Pt will demonstrate 0/10 pain with Median nerve tension test Baseline: Positive with tingling in index, middle finger.  Goal status: INITIAL  3.  Pt will demonstrate <5/50 score on NDI to improve QOL Baseline: 11/50 Goal status: INITIAL  4.  Pt will improve Quick Dash score to 11 to demonstrate improve UE functions Baseline: 14 Goal status: INITIAL  5.  Pt will reduce HDI score by 50 % to demonstrate improved QOL.  Baseline: 28= 37% disability Goal status: INITIAL  6. Pt will improve 2 jaw chuck strength to 3+/5 to demonstrate improve UE functions.              Baseline: 3/5             Goal Status: INITIAL   PLAN:  PT FREQUENCY: 1-2x/week  PT DURATION: 8 weeks  PLANNED INTERVENTIONS: 97110-Therapeutic exercises, 97530- Therapeutic activity, [id number]- Neuromuscular re-education, 97535- Self Care, 16109- Manual therapy, and G0283- Electrical stimulation (unattended)  PLAN FOR NEXT SESSION: Median nerve glide review, C/S manual traction/ mechanical traction, C/S stretches, Neck isometrics  Sherill Ding Eda Magnussen PT, DPT, GCS  6:24 PM,01/23/24

## 2024-01-24 ENCOUNTER — Ambulatory Visit

## 2024-01-24 DIAGNOSIS — M542 Cervicalgia: Secondary | ICD-10-CM | POA: Diagnosis not present

## 2024-01-24 NOTE — Therapy (Signed)
 OUTPATIENT PHYSICAL THERAPY CERVICAL TREATMENT  Patient Name: Mary Macias MRN: [id number] DOB:1966/05/20, 58 y.o., female Today's Date: 01/24/2024  END OF SESSION:  PT End of Session - 01/24/24 1028     Visit Number 7    Number of Visits 9    Date for PT Re-Evaluation 02/22/24    PT Start Time 0800    PT Stop Time 0845    PT Time Calculation (min) 45 min    Activity Tolerance Patient tolerated treatment well    Behavior During Therapy [facility] for tasks assessed/performed            Past Medical History:  Diagnosis Date   Allergy    Complication of anesthesia    slow to wake up   Diabetes mellitus without complication (HCC)    Pneumonia    PONV (postoperative nausea and vomiting)    Sleep apnea    cpap   Stable angina pectoris (HCC) 08/10/2017   Past Surgical History:  Procedure Laterality Date   CHOLECYSTECTOMY     COLONOSCOPY N/A 11/27/2022   Procedure: COLONOSCOPY;  Surgeon: Mary Buckle, DO;  Location: [facility] ENDOSCOPY;  Service: Gastroenterology;  Laterality: N/A;   COLONOSCOPY WITH PROPOFOL N/A 07/13/2017   Procedure: COLONOSCOPY WITH PROPOFOL;  Surgeon: Mary Fly, MD;  Location: [facility] ENDOSCOPY;  Service: Endoscopy;  Laterality: N/A;   dental implants Right    NASAL SINUS SURGERY     OOPHORECTOMY     SHOULDER ARTHROSCOPY WITH ROTATOR CUFF REPAIR AND SUBACROMIAL DECOMPRESSION Right 11/22/2020   Procedure: Right shoulder arthroscopic rotator cuff repair, subacromial decompression, and biceps tenodesis ,distal clavicle excision;  Surgeon: Mary Rota, MD;  Location: [facility] ORS;  Service: Orthopedics;  Laterality: Right;   TOTAL VAGINAL HYSTERECTOMY     TUBAL LIGATION     Patient Active Problem List   Diagnosis Date Noted   Cervical spondylosis 11/23/2023   Acute recurrent maxillary sinusitis 10/15/2021   Adult ADHD 04/28/2021   Hx of total hysterectomy 12/15/2020   Nontraumatic complete tear of right rotator cuff 12/15/2020   OSA  (obstructive sleep apnea) 02/02/2020   Hepatic steatosis 11/22/2019   Depression with anxiety 08/07/2019   Peripheral edema 02/08/2018   BMI 50.0-59.9, adult (HCC) 01/25/2018   Colon polyp, hyperplastic 07/16/2017   Diabetes mellitus treated with injections of non-insulin medication (HCC) 08/18/2015   Hyperlipidemia associated with type 2 diabetes mellitus (HCC) 07/08/2015   Environmental and seasonal allergies 07/08/2015   Avitaminosis D 07/08/2015   Beat, premature ventricular 02/11/2015   PCP: Mary Melter MD  REFERRING PROVIDER: Orpah Blackwater MD  REFERRING DIAG: Cervical Spondylosis  THERAPY DIAG: Headache, Neck tightness and neck pain.   Rationale for Evaluation and Treatment: Rehabilitation  ONSET DATE: 6 to 8 months ago.   FROM INITIAL EVALUATION SUBJECTIVE:  SUBJECTIVE STATEMENT: Pt reported: " My L index finger twitches, tremors and so when I say my Doctor. My CT from 2024 and thinks it is due to arthritis. Since my R shoulder surgery RCR and Bicep tenodesis my neck feels tight. Denies arm pain. Hand dominance: Right  PERTINENT HISTORY:  As per EMR: [name]  reported of Index finger, which occurs mostly at night or when her arm is resting on a surface. The tremor is not constant and is sometimes accompanied by a sensation of vibration.  She denies any overt numbness or tingling in fingers, no weakness. She has a history of degenerative disc disease and has experienced a fall in the past, which resulted in a CT scan of her neck showing degenerative changes. Intermittent unilateral finger tremor in the setting of multilevel cervical spinal arthropathy. Noted to be worse in the evenings. No clear correlation with medication use/changes however can be correlated to  Strattera usage as Tremor is listed as 1% to 5% incidence. Possible cervical spine involvement given unilateral nature and evening predominance. Started trial of low-dose Gabapentin at bedtime, with option to increase up to three capsules as needed   PAIN: Are you having pain? No, have tension headaches 2 x week from back of the neck in to front.   PRECAUTIONS: None  RED FLAGS: None    WEIGHT BEARING RESTRICTIONS: No  FALLS: Has patient fallen in last 6 months? 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   PLOF: Independent  PATIENT GOALS: " I want to move my neck better and not feel like popping it so that I can play with my grand children and work without pain."   NEXT MD VISIT:  None this time.    OBJECTIVE:  Note: Objective measures were completed at Evaluation unless otherwise noted.  DIAGNOSTIC FINDINGS:  CT Scan: Disc levels: Disc space narrowing with marginal osteophytes C3-4 through C5-6. 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    PATIENT SURVEYS:  NDI 11/50= 22% Quick Dash 14= 6.81 assessment HDI: 28  COGNITION: Overall cognitive status: Within functional limits for tasks assessed  SENSATION: WFL  REFLEX : L brachioradialis : absent  POSTURE: rounded shoulders, forward head, decreased lumbar lordosis, and decreased Cervical Lordosis  PALPATION: No pain elicited   CERVICAL ROM:   01/15/24: AROM AROM (Normal range in degrees) AROM  Cervical  Flexion (50) 55  Extension (80) 34  Right lateral flexion (45) 25  Left lateral flexion (45) 45  Right rotation (85) 35  Left rotation (85) 60  (* = pain; Blank rows = not tested)  UPPER EXTREMITY ROM: WNL  UPPER EXTREMITY MMT: 5/5  EXCEPT L Two Jaw Chuck grip 3/5  CERVICAL SPECIAL TESTS:  Neck flexor muscle endurance test: Negative, Spurling's test: Negative,  Distraction test: Positive ( pt felt relief)  and Sharp pursor's test: Negative  FUNCTIONAL TESTS: None indicated   TREATMENT DATE: 01/22/2024  Subjective: Pt reports she is doing well today. She continues with neck stiffness but no resting pain upon arrival. Her tension headache frequency has improved significantly.. No specific questions or concerns currently.   Pain: Denies resting pain but reports stiffness;   Ther-ex  UBE 5 minutes total (2.5 forward/2.5 backwards) during interval history; Prone cervical retractions 10s hold/10s relax x 10; Supine cervical retractions with overpressure from therapist 3-5s hold x 10; Supine DNF flexion 10s hold/10s relax x 10; Cervical manually resisted rotation and lateral flexion x 10 each direction bilaterally; Seated "W's" with blue tband x 10; Seated low rows with blue tband x 10; HEP updated and reviewed;   Manual Therapy  Prone CPA C2-T1, grade II-III, 20s/bout x 1 bout/level; Prone STM to R upper trap, levator scapulae, and R cervical paraspinals; Supine R upper trap, L lateral flexion, and R levator scapulae stretches 2 x 45s each;   Not performed:  Nautilus lat pull downs 75# x 10, 95# x 10, 110# x 10; Nautilus rows 50# x 12, 60# 2 x 10;   PATIENT EDUCATION:  Education details: exercise form/technique, updated HEP Person educated: Patient Education method: Explanation, Tactile cues, Verbal cues, and handout Education comprehension: verbalized and demonstrated understanding   HOME EXERCISE PROGRAM: Access Code: [email or web address] URL: [email or web address] Date: 01/24/2024 Prepared by: Ria Comment  Exercises - Seated [place] Stretch with Towel  - 1-2 x daily - 7 x weekly - 3-5 reps - 45-60s hold - Doorway Pec Stretch at 90 Degrees Abduction  - 1 x daily - 7 x weekly - 3-5 reps - 45-60s  hold - Seated Upper Trapezius Stretch  - 1 x daily - 7 x weekly - 3-5 reps - 45-60s hold - Seated Isometric Cervical Sidebending  - 1 x daily - 3 x weekly - 3 sets - 10 reps - 5s hold - Seated Isometric Cervical Rotation  - 1 x daily - 3 x weekly - 3 sets - 10 reps - 5s hold - Scapular Retraction with Resistance  - 1 x daily - 3 x weekly - 3 sets - 10 reps - 3s hold - Seated Shoulder Flexion with Dumbbells  - 1 x daily - 3 x weekly - 3 sets - 10 reps - 3s hold - Seated Bilateral Shoulder Scaption with Dumbbells  - 1 x daily - 3 x weekly - 3 sets - 10 reps - 3s hold   ASSESSMENT:  CLINICAL IMPRESSION: Pt reports some persistent soreness from TDN last therapy session so deferred today. Progressed stretches, manual techniques, and cervical strengthening. Progressed periscapular strengthening and updated HEP. Plan to progress strengthening at future sessions. Pt advised to continue HEP and follow-up as scheduled. She will benefit from PT services to address deficits in strength, range of motion, and pain in order to return to full function at home.   OBJECTIVE IMPAIRMENTS: decreased mobility, decreased ROM, and decreased strength.   ACTIVITY LIMITATIONS: headaches and gripping with LUE  PARTICIPATION LIMITATIONS: cleaning and occupation   REHAB POTENTIAL: Good  CLINICAL DECISION MAKING: Stable/uncomplicated  EVALUATION COMPLEXITY: Low   GOALS: Goals reviewed with patient? Yes  SHORT TERM GOALS: Target date: 01/11/2024  Pt will demonstrate 100% compliance to HEP in clinic to demonstrate long term benefit to rehab interventions.  Baseline:  To be provided next sessions  Goal status: INITIAL    LONG TERM GOALS: Target date: 02/22/2024  Pt will improve C/S ROM to AFL without tightness and pain to improve headaches and Median nerve compression.  Baseline:  WFL and tight ness at the end range.  Goal status: INITIAL  2.  Pt will demonstrate 0/10 pain with Median nerve tension  test Baseline: Positive with tingling in index, middle finger.  Goal status: INITIAL  3.  Pt will demonstrate <5/50 score on NDI to improve QOL Baseline: 11/50 Goal status: INITIAL  4.  Pt will improve Quick Dash score to 11 to demonstrate improve UE functions Baseline: 14 Goal status: INITIAL  5.  Pt will reduce HDI score by 50 % to demonstrate improved QOL.  Baseline: 28= 37% disability Goal status: INITIAL  6. Pt will improve 2 jaw chuck strength to 3+/5 to demonstrate improve UE functions.              Baseline: 3/5             Goal Status: INITIAL   PLAN:  PT FREQUENCY: 1-2x/week  PT DURATION: 8 weeks  PLANNED INTERVENTIONS: 97110-Therapeutic exercises, 97530- Therapeutic activity, [id number]- Neuromuscular re-education, 97535- Self Care, [id number]- Manual therapy, and G0283- Electrical stimulation (unattended)  PLAN FOR NEXT SESSION: Median nerve glide review, C/S manual traction/ mechanical traction, C/S stretches, Neck isometrics  Sherill Ding Julianah Marciel PT, DPT, GCS  10:34 AM,01/24/24

## 2024-01-26 NOTE — Therapy (Signed)
 OUTPATIENT PHYSICAL THERAPY CERVICAL TREATMENT  Patient Name: Mary Macias MRN: [id number] DOB:1966/06/08, 58 y.o., female Today's Date: 01/30/2024  END OF SESSION:  PT End of Session - 01/29/24 1455     Visit Number 8    Number of Visits 17    Date for PT Re-Evaluation 02/22/24    Authorization Type eval: 12/25/23    PT Start Time 1445    PT Stop Time 1530    PT Time Calculation (min) 45 min    Activity Tolerance Patient tolerated treatment well    Behavior During Therapy [facility] for tasks assessed/performed            Past Medical History:  Diagnosis Date   Allergy    Complication of anesthesia    slow to wake up   Diabetes mellitus without complication (HCC)    Pneumonia    PONV (postoperative nausea and vomiting)    Sleep apnea    cpap   Stable angina pectoris (HCC) 08/10/2017   Past Surgical History:  Procedure Laterality Date   CHOLECYSTECTOMY     COLONOSCOPY N/A 11/27/2022   Procedure: COLONOSCOPY;  Surgeon: Quintin Buckle, DO;  Location: [facility] ENDOSCOPY;  Service: Gastroenterology;  Laterality: N/A;   COLONOSCOPY WITH PROPOFOL  N/A 07/13/2017   Procedure: COLONOSCOPY WITH PROPOFOL ;  Surgeon: Deveron Fly, MD;  Location: [facility] ENDOSCOPY;  Service: Endoscopy;  Laterality: N/A;   dental implants Right    NASAL SINUS SURGERY     OOPHORECTOMY     SHOULDER ARTHROSCOPY WITH ROTATOR CUFF REPAIR AND SUBACROMIAL DECOMPRESSION Right 11/22/2020   Procedure: Right shoulder arthroscopic rotator cuff repair, subacromial decompression, and biceps tenodesis ,distal clavicle excision;  Surgeon: Lorri Rota, MD;  Location: [facility] ORS;  Service: Orthopedics;  Laterality: Right;   TOTAL VAGINAL HYSTERECTOMY     TUBAL LIGATION     Patient Active Problem List   Diagnosis Date Noted   Cervical spondylosis 11/23/2023   Acute recurrent maxillary sinusitis 10/15/2021   Adult ADHD 04/28/2021   Hx of total hysterectomy 12/15/2020   Nontraumatic complete tear of right  rotator cuff 12/15/2020   OSA (obstructive sleep apnea) 02/02/2020   Hepatic steatosis 11/22/2019   Depression with anxiety 08/07/2019   Peripheral edema 02/08/2018   BMI 50.0-59.9, adult (HCC) 01/25/2018   Colon polyp, hyperplastic 07/16/2017   Diabetes mellitus treated with injections of non-insulin medication (HCC) 08/18/2015   Hyperlipidemia associated with type 2 diabetes mellitus (HCC) 07/08/2015   Environmental and seasonal allergies 07/08/2015   Avitaminosis D 07/08/2015   Beat, premature ventricular 02/11/2015   PCP: Janna Melter MD  REFERRING PROVIDER: Orpah Blackwater MD  REFERRING DIAG: Cervical Spondylosis  THERAPY DIAG: Headache, Neck tightness and neck pain.   Rationale for Evaluation and Treatment: Rehabilitation  ONSET DATE: 6 to 8 months ago.   FROM INITIAL EVALUATION SUBJECTIVE:  SUBJECTIVE STATEMENT: Pt reported: " My L index finger twitches, tremors and so when I say my Doctor. My CT from 2024 and thinks it is due to arthritis. Since my R shoulder surgery RCR and Bicep tenodesis my neck feels tight. Denies arm pain. Hand dominance: Right  PERTINENT HISTORY:  As per EMR: [name]  reported of Index finger, which occurs mostly at night or when her arm is resting on a surface. The tremor is not constant and is sometimes accompanied by a sensation of vibration.  She denies any overt numbness or tingling in fingers, no weakness. She has a history of degenerative disc disease and has experienced a fall in the past, which resulted in a CT scan of her neck showing degenerative changes. Intermittent unilateral finger tremor in the setting of multilevel cervical spinal arthropathy. Noted to be worse in the evenings. No clear correlation with medication use/changes  however can be correlated to Strattera usage as Tremor is listed as 1% to 5% incidence. Possible cervical spine involvement given unilateral nature and evening predominance. Started trial of low-dose Gabapentin  at bedtime, with option to increase up to three capsules as needed   PAIN: Are you having pain? No, have tension headaches 2 x week from back of the neck in to front.   PRECAUTIONS: None  RED FLAGS: None    WEIGHT BEARING RESTRICTIONS: No  FALLS: Has patient fallen in last 6 months? 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   PLOF: Independent  PATIENT GOALS: " I want to move my neck better and not feel like popping it so that I can play with my grand children and work without pain."   NEXT MD VISIT:  None this time.    OBJECTIVE:  Note: Objective measures were completed at Evaluation unless otherwise noted.  DIAGNOSTIC FINDINGS:  CT Scan: Disc levels: Disc space narrowing with marginal osteophytes C3-4 through C5-6. 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    PATIENT SURVEYS:  NDI 11/50= 22% Quick Dash 14= 6.81 assessment HDI: 28  COGNITION: Overall cognitive status: Within functional limits for tasks assessed  SENSATION: WFL  REFLEX : L brachioradialis : absent  POSTURE: rounded shoulders, forward head, decreased lumbar lordosis, and decreased Cervical Lordosis  PALPATION: No pain elicited   CERVICAL ROM:   01/15/24: AROM AROM (Normal range in degrees) AROM  Cervical  Flexion (50) 55  Extension (80) 34  Right lateral flexion (45) 25  Left lateral flexion (45) 45  Right rotation (85) 35  Left rotation (85) 60  (* = pain; Blank rows = not tested)  UPPER EXTREMITY ROM: WNL  UPPER EXTREMITY MMT: 5/5  EXCEPT L Two Jaw Chuck grip 3/5  CERVICAL SPECIAL TESTS:  Neck flexor muscle endurance test: Negative,  Spurling's test: Negative, Distraction test: Positive ( pt felt relief)  and Sharp pursor's test: Negative  FUNCTIONAL TESTS: None indicated   TREATMENT DATE: 01/29/2024  Subjective: Pt reports she is doing well today. She continues with occasional R sided neck stiffness but no resting pain upon arrival. Her tension headaches continue to remain improved as does her hand numbness. No specific questions or concerns currently.   Pain: Denies resting pain but reports mild R upper trap stiffness;   Ther-ex  UBE 5 minutes total (2.5 forward/2.5 backwards) during interval history; Supine cervical retractions with overpressure from therapist 3s hold x 10; Cervical manually resisted rotation and lateral flexion x 10 each direction bilaterally;   Manual Therapy  Prone CPA C2-C7, grade II-III, 20s/bout x 1 bout/level; Prone R C1 lateral mass mobilizations, 20s/bout x 2 bouts; Prone STM to R upper trap, levator scapulae, and R cervical paraspinals; Supine R upper trap, L lateral flexion, and R levator scapulae stretches 2 x 45s each;   Trigger Point Dry Needling, unbilled Subsequent Treatment: Instructions provided previously at initial dry needling treatment.   Patient Verbal Consent Given: Yes Education Handout Provided: No  Muscles Treated: In prone using clean technique TDN performed to R upper trap with 2, 0.30 x 60 single needle placements (posterior approach) with local twitch response (LTR) during both placements. Also performed 2 additional placements in R splenius cervicis/capitis in prone using perpendicular approach with LTR noted during all placements. While in prone performed 2, 0.25 x 40 single needle placements to R suboccipitals. Pistoning technique utilized. Electrical Stimulation Performed: No Treatment Response/Outcome: Soreness   Not performed:   Nautilus lat pull downs 75# x 10, 95# x 10, 110# x 10; Nautilus rows 50# x 12, 60# 2 x 10;   PATIENT EDUCATION:  Education details: exercise form/technique, updated HEP Person educated: Patient Education method: Explanation, Tactile cues, Verbal cues, and handout Education comprehension: verbalized and demonstrated understanding   HOME EXERCISE PROGRAM: Access Code: [email or web address] URL: [email or web address] Date: 01/24/2024 Prepared by: Crawford Dock  Exercises - Seated Scalene Stretch with Towel  - 1-2 x daily - 7 x weekly - 3-5 reps - 45-60s hold - Doorway Pec Stretch at 90 Degrees Abduction  - 1 x daily - 7 x weekly - 3-5 reps - 45-60s hold - Seated Upper Trapezius Stretch  - 1 x daily - 7 x weekly - 3-5 reps - 45-60s hold - Seated Isometric Cervical Sidebending  - 1 x daily - 3 x weekly - 3 sets - 10 reps - 5s hold - Seated Isometric Cervical Rotation  - 1 x daily - 3 x weekly - 3 sets - 10 reps - 5s hold - Scapular Retraction with Resistance  - 1 x daily - 3 x weekly - 3 sets - 10 reps - 3s hold - Seated Shoulder Flexion with Dumbbells  - 1 x daily - 3 x weekly - 3 sets - 10 reps - 3s hold - Seated Bilateral Shoulder Scaption with Dumbbells  - 1 x daily - 3 x weekly - 3 sets - 10 reps - 3s hold   ASSESSMENT:  CLINICAL IMPRESSION: Progressed stretches, manual techniques, and cervical strengthening today. More time spend on manual techniques than strengthening and plan to progress upper quarter strengthening at next visit. Repeated TDN with additional placements into splenius cervicis/capitis. Pt advised to continue HEP and follow-up as scheduled. She will benefit from PT services to address deficits in strength, range of motion, and pain in order to return to full function at home.   OBJECTIVE IMPAIRMENTS: decreased mobility, decreased ROM, and decreased strength.   ACTIVITY LIMITATIONS: headaches and gripping with LUE  PARTICIPATION LIMITATIONS:  cleaning and  occupation   REHAB POTENTIAL: Good  CLINICAL DECISION MAKING: Stable/uncomplicated  EVALUATION COMPLEXITY: Low   GOALS: Goals reviewed with patient? Yes  SHORT TERM GOALS: Target date: 01/11/2024  Pt will demonstrate 100% compliance to HEP in clinic to demonstrate long term benefit to rehab interventions.  Baseline:  To be provided next sessions  Goal status: INITIAL    LONG TERM GOALS: Target date: 02/22/2024  Pt will improve C/S ROM to AFL without tightness and pain to improve headaches and Median nerve compression.  Baseline: WFL and tight ness at the end range.  Goal status: INITIAL  2.  Pt will demonstrate 0/10 pain with Median nerve tension test Baseline: Positive with tingling in index, middle finger.  Goal status: INITIAL  3.  Pt will demonstrate <5/50 score on NDI to improve QOL Baseline: 11/50 Goal status: INITIAL  4.  Pt will improve Quick Dash score to 11 to demonstrate improve UE functions Baseline: 14 Goal status: INITIAL  5.  Pt will reduce HDI score by 50 % to demonstrate improved QOL.  Baseline: 28= 37% disability Goal status: INITIAL  6. Pt will improve 2 jaw chuck strength to 3+/5 to demonstrate improve UE functions.              Baseline: 3/5             Goal Status: INITIAL   PLAN:  PT FREQUENCY: 1-2x/week  PT DURATION: 8 weeks  PLANNED INTERVENTIONS: 97110-Therapeutic exercises, 97530- Therapeutic activity, [id number]- Neuromuscular re-education, 97535- Self Care, 60454- Manual therapy, and G0283- Electrical stimulation (unattended)  PLAN FOR NEXT SESSION: Median nerve glide review, C/S manual traction/ mechanical traction, C/S stretches, Neck isometrics  Sherill Ding Jahna Liebert PT, DPT, GCS  10:54 AM,01/30/24

## 2024-01-29 ENCOUNTER — Ambulatory Visit

## 2024-01-29 DIAGNOSIS — M542 Cervicalgia: Secondary | ICD-10-CM | POA: Diagnosis not present

## 2024-01-31 ENCOUNTER — Ambulatory Visit

## 2024-02-07 ENCOUNTER — Ambulatory Visit: Attending: Family Medicine

## 2024-02-07 ENCOUNTER — Encounter: Payer: Self-pay | Admitting: Internal Medicine

## 2024-02-07 DIAGNOSIS — M542 Cervicalgia: Secondary | ICD-10-CM | POA: Diagnosis present

## 2024-02-07 DIAGNOSIS — G44011 Episodic cluster headache, intractable: Secondary | ICD-10-CM | POA: Insufficient documentation

## 2024-02-07 NOTE — Therapy (Signed)
 OUTPATIENT PHYSICAL THERAPY CERVICAL TREATMENT  Patient Name: Mary Macias MRN: [id number] DOB:03-13-1966, 58 y.o., female Today's Date: 02/07/2024  END OF SESSION:  PT End of Session - 02/07/24 1315     Visit Number 9    Number of Visits 17    Date for PT Re-Evaluation 02/22/24    Authorization Type eval: 12/25/23    PT Start Time 1017    PT Stop Time 1057    PT Time Calculation (min) 40 min    Activity Tolerance Patient tolerated treatment well    Behavior During Therapy [facility] for tasks assessed/performed             Past Medical History:  Diagnosis Date   Allergy    Complication of anesthesia    slow to wake up   Diabetes mellitus without complication (HCC)    Pneumonia    PONV (postoperative nausea and vomiting)    Sleep apnea    cpap   Stable angina pectoris (HCC) 08/10/2017   Past Surgical History:  Procedure Laterality Date   CHOLECYSTECTOMY     COLONOSCOPY N/A 11/27/2022   Procedure: COLONOSCOPY;  Surgeon: Mary Buckle, DO;  Location: [facility] ENDOSCOPY;  Service: Gastroenterology;  Laterality: N/A;   COLONOSCOPY WITH PROPOFOL  N/A 07/13/2017   Procedure: COLONOSCOPY WITH PROPOFOL ;  Surgeon: Mary Fly, MD;  Location: [facility] ENDOSCOPY;  Service: Endoscopy;  Laterality: N/A;   dental implants Right    NASAL SINUS SURGERY     OOPHORECTOMY     SHOULDER ARTHROSCOPY WITH ROTATOR CUFF REPAIR AND SUBACROMIAL DECOMPRESSION Right 11/22/2020   Procedure: Right shoulder arthroscopic rotator cuff repair, subacromial decompression, and biceps tenodesis ,distal clavicle excision;  Surgeon: Lorri Rota, MD;  Location: [facility] ORS;  Service: Orthopedics;  Laterality: Right;   TOTAL VAGINAL HYSTERECTOMY     TUBAL LIGATION     Patient Active Problem List   Diagnosis Date Noted   Cervical spondylosis 11/23/2023   Acute recurrent maxillary sinusitis 10/15/2021   Adult ADHD 04/28/2021   Hx of total hysterectomy 12/15/2020   Nontraumatic complete tear of right  rotator cuff 12/15/2020   OSA (obstructive sleep apnea) 02/02/2020   Hepatic steatosis 11/22/2019   Depression with anxiety 08/07/2019   Peripheral edema 02/08/2018   BMI 50.0-59.9, adult (HCC) 01/25/2018   Colon polyp, hyperplastic 07/16/2017   Diabetes mellitus treated with injections of non-insulin medication (HCC) 08/18/2015   Hyperlipidemia associated with type 2 diabetes mellitus (HCC) 07/08/2015   Environmental and seasonal allergies 07/08/2015   Avitaminosis D 07/08/2015   Beat, premature ventricular 02/11/2015   PCP: Mary Melter MD  REFERRING PROVIDER: Orpah Blackwater MD  REFERRING DIAG: Cervical Spondylosis  THERAPY DIAG: Headache, Neck tightness and neck pain.   Rationale for Evaluation and Treatment: Rehabilitation  ONSET DATE: 6 to 8 months ago.   FROM INITIAL EVALUATION SUBJECTIVE:  SUBJECTIVE STATEMENT: Pt reported: " My L index finger twitches, tremors and so when I say my Doctor. My CT from 2024 and thinks it is due to arthritis. Since my R shoulder surgery RCR and Bicep tenodesis my neck feels tight. Denies arm pain. Hand dominance: Right  PERTINENT HISTORY:  As per EMR: [name]  reported of Index finger, which occurs mostly at night or when her arm is resting on a surface. The tremor is not constant and is sometimes accompanied by a sensation of vibration.  She denies any overt numbness or tingling in fingers, no weakness. She has a history of degenerative disc disease and has experienced a fall in the past, which resulted in a CT scan of her neck showing degenerative changes. Intermittent unilateral finger tremor in the setting of multilevel cervical spinal arthropathy. Noted to be worse in the evenings. No clear correlation with medication use/changes  however can be correlated to Strattera usage as Tremor is listed as 1% to 5% incidence. Possible cervical spine involvement given unilateral nature and evening predominance. Started trial of low-dose Gabapentin  at bedtime, with option to increase up to three capsules as needed   PAIN: Are you having pain? No, have tension headaches 2 x week from back of the neck in to front.   PRECAUTIONS: None  RED FLAGS: None    WEIGHT BEARING RESTRICTIONS: No  FALLS: Has patient fallen in last 6 months? 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   PLOF: Independent  PATIENT GOALS: " I want to move my neck better and not feel like popping it so that I can play with my grand children and work without pain."   NEXT MD VISIT:  None this time.    OBJECTIVE:  Note: Objective measures were completed at Evaluation unless otherwise noted.  DIAGNOSTIC FINDINGS:  CT Scan: Disc levels: Disc space narrowing with marginal osteophytes C3-4 through C5-6. 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    PATIENT SURVEYS:  NDI 11/50= 22% Quick Dash 14= 6.81 assessment HDI: 28  COGNITION: Overall cognitive status: Within functional limits for tasks assessed  SENSATION: WFL  REFLEX : L brachioradialis : absent  POSTURE: rounded shoulders, forward head, decreased lumbar lordosis, and decreased Cervical Lordosis  PALPATION: No pain elicited   CERVICAL ROM:   01/15/24: AROM AROM (Normal range in degrees) AROM  Cervical  Flexion (50) 55  Extension (80) 34  Right lateral flexion (45) 25  Left lateral flexion (45) 45  Right rotation (85) 35  Left rotation (85) 60  (* = pain; Blank rows = not tested)  UPPER EXTREMITY ROM: WNL  UPPER EXTREMITY MMT: 5/5  EXCEPT L Two Jaw Chuck grip 3/5  CERVICAL SPECIAL TESTS:  Neck flexor muscle endurance test: Negative,  Spurling's test: Negative, Distraction test: Positive ( pt felt relief)  and Sharp pursor's test: Negative  FUNCTIONAL TESTS: None indicated   TREATMENT DATE: 01/29/2024  Subjective: Pt reports she is doing well today. Reported of one episode of headache in last 2 weeks and mild tightness with K SB and L rotation in below head and upper nec area. Spasms in between scapula. No specific questions or concerns currently.   Pain: Denies resting pain but reports mild R upper trap stiffness;   Ther-ex  UBE 5 minutes total (2.5 forward/2.5 backwards) during interval history; Supine cervical retractions with overpressure from therapist 3s hold x 10; Cervical manually resisted rotation and lateral flexion x 10 each direction bilaterally; Pari scapular muscle strengthening #4 3 x 10 reps Resisted chin tucks, Diagonal and EB with Red Tband. 2 x 10 reps each Manual Therapy STM to UT, Rhomboids, C/S paraspinals.  Supine PROM to C/S, Grade II/II C/S mobs,   Not today:   Prone CPA C2-C7, grade II-III, 20s/bout x 1 bout/level; Prone R C1 lateral mass mobilizations, 20s/bout x 2 bouts; Prone STM to R upper trap, levator scapulae, and R cervical paraspinals; Supine R upper trap, L lateral flexion, and R levator scapulae stretches 2 x 45s each;   Trigger Point Dry Needling, unbilled Subsequent Treatment: Instructions provided previously at initial dry needling treatment.   Patient Verbal Consent Given: Yes Education Handout Provided: No  Muscles Treated: In prone using clean technique TDN performed to R upper trap with 2, 0.30 x 60 single needle placements (posterior approach) with local twitch response (LTR) during both placements. Also performed 2 additional placements in R splenius cervicis/capitis in prone using perpendicular approach with LTR noted during all placements.  While in prone performed 2, 0.25 x 40 single needle placements to R suboccipitals. Pistoning technique utilized. Electrical Stimulation Performed: No Treatment Response/Outcome: Soreness   Not performed:  Nautilus lat pull downs 75# x 10, 95# x 10, 110# x 10; Nautilus rows 50# x 12, 60# 2 x 10;   PATIENT EDUCATION:  Education details: exercise form/technique, updated HEP Person educated: Patient Education method: Explanation, Tactile cues, Verbal cues, and handout Education comprehension: verbalized and demonstrated understanding   HOME EXERCISE PROGRAM: Access Code: [email or web address] URL: [email or web address] Date: 01/24/2024 Prepared by: Crawford Dock  Exercises - Seated Scalene Stretch with Towel  - 1-2 x daily - 7 x weekly - 3-5 reps - 45-60s hold - Doorway Pec Stretch at 90 Degrees Abduction  - 1 x daily - 7 x weekly - 3-5 reps - 45-60s hold - Seated Upper Trapezius Stretch  - 1 x daily - 7 x weekly - 3-5 reps - 45-60s hold - Seated Isometric Cervical Sidebending  - 1 x daily - 3 x weekly - 3 sets - 10 reps - 5s hold - Seated Isometric Cervical Rotation  - 1 x daily - 3 x weekly - 3 sets - 10 reps - 5s hold - Scapular Retraction with Resistance  - 1 x daily - 3 x weekly - 3 sets - 10 reps - 3s hold - Seated Shoulder Flexion with Dumbbells  - 1 x daily - 3 x weekly - 3 sets - 10 reps - 3s hold - Seated Bilateral Shoulder Scaption with Dumbbells  - 1 x daily - 3 x weekly - 3 sets - 10 reps - 3s hold   ASSESSMENT:  CLINICAL IMPRESSION: Progressed stretches, manual techniques, and cervical strengthening today. More time spend on manual techniques than strengthening and plan to progress upper quarter strengthening at next visit. Introduced pari scapular muscle strengthening with #4. Pt advise to monitor the intensity along with the frequency of headache to  assess effectiveness of the rehab. Pt is progressing well.  Pt advised to continue HEP and follow-up as scheduled. She  will benefit from PT services to address deficits in strength, range of motion, and pain in order to return to full function at home.   OBJECTIVE IMPAIRMENTS: decreased mobility, decreased ROM, and decreased strength.   ACTIVITY LIMITATIONS: headaches and gripping with LUE  PARTICIPATION LIMITATIONS: cleaning and occupation   REHAB POTENTIAL: Good  CLINICAL DECISION MAKING: Stable/uncomplicated  EVALUATION COMPLEXITY: Low   GOALS: Goals reviewed with patient? Yes  SHORT TERM GOALS: Target date: 01/11/2024  Pt will demonstrate 100% compliance to HEP in clinic to demonstrate long term benefit to rehab interventions.  Baseline:  To be provided next sessions  Goal status: INITIAL    LONG TERM GOALS: Target date: 02/22/2024  Pt will improve C/S ROM to AFL without tightness and pain to improve headaches and Median nerve compression.  Baseline: WFL and tight ness at the end range.  Goal status: INITIAL  2.  Pt will demonstrate 0/10 pain with Median nerve tension test Baseline: Positive with tingling in index, middle finger.  Goal status: INITIAL  3.  Pt will demonstrate <5/50 score on NDI to improve QOL Baseline: 11/50 Goal status: INITIAL  4.  Pt will improve Quick Dash score to 11 to demonstrate improve UE functions Baseline: 14 Goal status: INITIAL  5.  Pt will reduce HDI score by 50 % to demonstrate improved QOL.  Baseline: 28= 37% disability Goal status: INITIAL  6. Pt will improve 2 jaw chuck strength to 3+/5 to demonstrate improve UE functions.              Baseline: 3/5             Goal Status: INITIAL   PLAN:  PT FREQUENCY: 1-2x/week  PT DURATION: 8 weeks  PLANNED INTERVENTIONS: 97110-Therapeutic exercises, 97530- Therapeutic activity, [id number]- Neuromuscular re-education, 97535- Self Care, 40981- Manual therapy, and G0283- Electrical stimulation (unattended)  PLAN FOR NEXT SESSION: Median nerve glide review, C/S manual traction/ mechanical traction, C/S  stretches, Neck isometrics  Laiah Pouncey PT DPT 1:16 PM,02/07/24

## 2024-02-11 NOTE — Therapy (Addendum)
 OUTPATIENT PHYSICAL THERAPY CERVICAL TREATMENT/PROGRESS NOTE  Dates of reporting period  12/25/23   to   02/12/24    Patient Name: Mary Macias MRN: [id number] DOB:1966/05/30, 58 y.o., female Today's Date: 02/13/2024  END OF SESSION:  PT End of Session - 02/12/24 1447     Visit Number 10    Number of Visits 17    Date for PT Re-Evaluation 02/22/24    Authorization Type eval: 12/25/23    PT Start Time 1445    PT Stop Time 1530    PT Time Calculation (min) 45 min    Activity Tolerance Patient tolerated treatment well    Behavior During Therapy [facility] for tasks assessed/performed            Past Medical History:  Diagnosis Date   Allergy    Complication of anesthesia    slow to wake up   Diabetes mellitus without complication (HCC)    Pneumonia    PONV (postoperative nausea and vomiting)    Sleep apnea    cpap   Stable angina pectoris (HCC) 08/10/2017   Past Surgical History:  Procedure Laterality Date   CHOLECYSTECTOMY     COLONOSCOPY N/A 11/27/2022   Procedure: COLONOSCOPY;  Surgeon: Mary Buckle, DO;  Location: [facility] ENDOSCOPY;  Service: Gastroenterology;  Laterality: N/A;   COLONOSCOPY WITH PROPOFOL  N/A 07/13/2017   Procedure: COLONOSCOPY WITH PROPOFOL ;  Surgeon: Mary Fly, MD;  Location: [facility] ENDOSCOPY;  Service: Endoscopy;  Laterality: N/A;   dental implants Right    NASAL SINUS SURGERY     OOPHORECTOMY     SHOULDER ARTHROSCOPY WITH ROTATOR CUFF REPAIR AND SUBACROMIAL DECOMPRESSION Right 11/22/2020   Procedure: Right shoulder arthroscopic rotator cuff repair, subacromial decompression, and biceps tenodesis ,distal clavicle excision;  Surgeon: Mary Rota, MD;  Location: [facility] ORS;  Service: Orthopedics;  Laterality: Right;   TOTAL VAGINAL HYSTERECTOMY     TUBAL LIGATION     Patient Active Problem List   Diagnosis Date Noted   Cervical spondylosis 11/23/2023   Acute recurrent maxillary sinusitis 10/15/2021   Adult ADHD 04/28/2021   Hx of  total hysterectomy 12/15/2020   Nontraumatic complete tear of right rotator cuff 12/15/2020   OSA (obstructive sleep apnea) 02/02/2020   Hepatic steatosis 11/22/2019   Depression with anxiety 08/07/2019   Peripheral edema 02/08/2018   BMI 50.0-59.9, adult (HCC) 01/25/2018   Colon polyp, hyperplastic 07/16/2017   Diabetes mellitus treated with injections of non-insulin medication (HCC) 08/18/2015   Hyperlipidemia associated with type 2 diabetes mellitus (HCC) 07/08/2015   Environmental and seasonal allergies 07/08/2015   Avitaminosis D 07/08/2015   Beat, premature ventricular 02/11/2015   PCP: Mary Melter MD  REFERRING PROVIDER: Orpah Blackwater MD  REFERRING DIAG: Cervical Spondylosis  THERAPY DIAG: Headache, Neck tightness and neck pain.   Rationale for Evaluation and Treatment: Rehabilitation  ONSET DATE: 6 to 8 months ago.   FROM INITIAL EVALUATION SUBJECTIVE:  SUBJECTIVE STATEMENT: Pt reported: " My L index finger twitches, tremors and so when I say my Doctor. My CT from 2024 and thinks it is due to arthritis. Since my R shoulder surgery RCR and Bicep tenodesis my neck feels tight. Denies arm pain. Hand dominance: Right  PERTINENT HISTORY:  As per EMR: [name]  reported of Index finger, which occurs mostly at night or when her arm is resting on a surface. The tremor is not constant and is sometimes accompanied by a sensation of vibration.  She denies any overt numbness or tingling in fingers, no weakness. She has a history of degenerative disc disease and has experienced a fall in the past, which resulted in a CT scan of her neck showing degenerative changes. Intermittent unilateral finger tremor in the setting of multilevel cervical spinal arthropathy. Noted to be worse  in the evenings. No clear correlation with medication use/changes however can be correlated to Strattera usage as Tremor is listed as 1% to 5% incidence. Possible cervical spine involvement given unilateral nature and evening predominance. Started trial of low-dose Gabapentin  at bedtime, with option to increase up to three capsules as needed   PAIN: Are you having pain? No, have tension headaches 2 x week from back of the neck in to front.   PRECAUTIONS: None  RED FLAGS: None    WEIGHT BEARING RESTRICTIONS: No  FALLS: Has patient fallen in last 6 months? 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   PLOF: Independent  PATIENT GOALS: " I want to move my neck better and not feel like popping it so that I can play with my grand children and work without pain."   NEXT MD VISIT:  None this time.    OBJECTIVE:  Note: Objective measures were completed at Evaluation unless otherwise noted.  DIAGNOSTIC FINDINGS:  CT Scan: Disc levels: Disc space narrowing with marginal osteophytes C3-4 through C5-6. 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    PATIENT SURVEYS:  NDI 11/50= 22% Quick Dash 14= 6.81 assessment HDI: 28  COGNITION: Overall cognitive status: Within functional limits for tasks assessed  SENSATION: WFL  REFLEX : L brachioradialis : absent  POSTURE: rounded shoulders, forward head, decreased lumbar lordosis, and decreased Cervical Lordosis  PALPATION: No pain elicited   CERVICAL ROM:   01/15/24: AROM AROM (Normal range in degrees) AROM  Cervical  Flexion (50) 55  Extension (80) 34  Right lateral flexion (45) 25  Left lateral flexion (45) 45  Right rotation (85) 35  Left rotation (85) 60  (* = pain; Blank rows = not tested)  UPPER EXTREMITY ROM: WNL  UPPER EXTREMITY MMT: 5/5  EXCEPT L Two Jaw Chuck grip  3/5  CERVICAL SPECIAL TESTS:  Neck flexor muscle endurance test: Negative, Spurling's test: Negative, Distraction test: Positive ( pt felt relief)  and Sharp pursor's test: Negative  FUNCTIONAL TESTS: None indicated   TREATMENT DATE: 02/12/2024  Subjective: Pt reports she is doing well today. She has had one headache in the last 2 weeks and continues with upper trap tightness. No specific questions or concerns currently.   Pain: Denies resting pain but reports mild R upper trap stiffness;   Manual Therapy UBE 5 minutes total (2.5 forward/2.5 backwards) during interval history; Updated outcome measures with patient (see goal section);  AROM AROM (Normal range in degrees) 01/15/24 02/12/24  Cervical   Flexion (50) 55 60  Extension (80) 34 43  Right lateral flexion (45) 25 32  Left lateral flexion (45) 45 35  Right rotation (85) 35 50  Left rotation (85) 60 60  (* = pain; Blank rows = not tested)  Prone CPA C2-C4, grade II-III, 20s/bout x 1 bout/level; Prone R C1 lateral mass mobilizations, 20s/bout x 2 bouts; Prone STM to R upper trap, levator scapulae, R cervical paraspinals, and suboccipitals; Supine R upper trap, L lateral flexion, and R levator scapulae stretches 2 x 45s each;   Trigger Point Dry Needling, unbilled Subsequent Treatment: Instructions provided previously at initial dry needling treatment.   Patient Verbal Consent Given: Yes Education Handout Provided: No  Muscles Treated: In supine using clean technique TDN performed to R upper trap with 2, 0.25 x 40 single needle placements. In prone TDN performed to R upper trap with 2, 0.30 x 60 single needle placements (posterior approach) with local twitch response (LTR) during both placements. Also performed 1 additional placement in R splenius cervicis/capitis in prone with a 0.30 x 60 needle using  perpendicular approach with LTR. While in prone performed 2, 0.25 x 40 single needle placements to bilateral suboccipitals (one on each side). Pistoning technique utilized. Electrical Stimulation Performed: No Treatment Response/Outcome: Soreness   Not performed:  Nautilus lat pull downs 75# x 10, 95# x 10, 110# x 10; Nautilus rows 50# x 12, 60# 2 x 10;   PATIENT EDUCATION:  Education details: exercise form/technique, updated HEP Person educated: Patient Education method: Explanation, Tactile cues, Verbal cues, and handout Education comprehension: verbalized and demonstrated understanding   HOME EXERCISE PROGRAM: Access Code: [email or web address] URL: [email or web address] Date: 01/24/2024 Prepared by: Crawford Dock  Exercises - Seated Scalene Stretch with Towel  - 1-2 x daily - 7 x weekly - 3-5 reps - 45-60s hold - Doorway Pec Stretch at 90 Degrees Abduction  - 1 x daily - 7 x weekly - 3-5 reps - 45-60s hold - Seated Upper Trapezius Stretch  - 1 x daily - 7 x weekly - 3-5 reps - 45-60s hold - Seated Isometric Cervical Sidebending  - 1 x daily - 3 x weekly - 3 sets - 10 reps - 5s hold - Seated Isometric Cervical Rotation  - 1 x daily - 3 x weekly - 3 sets - 10 reps - 5s hold - Scapular Retraction with Resistance  - 1 x daily - 3 x weekly - 3 sets - 10 reps - 3s hold - Seated Shoulder Flexion with Dumbbells  - 1 x daily - 3 x weekly - 3 sets - 10 reps - 3s hold - Seated Bilateral Shoulder Scaption with Dumbbells  - 1 x daily - 3 x weekly - 3 sets - 10 reps - 3s hold   ASSESSMENT:  CLINICAL IMPRESSION: Progressed stretches and manual techniques during session today. Repeated TDN given improvement during past sessions. Plan to progress upper quarter strengthening at next visit. Pt is progressing well and plan to decrease therapy frequency in the future. Pt advised to  continue HEP and follow-up as scheduled. She will benefit from PT services to address deficits in strength, range  of motion, and pain in order to return to full function at home.   OBJECTIVE IMPAIRMENTS: decreased mobility, decreased ROM, and decreased strength.   ACTIVITY LIMITATIONS: headaches and gripping with LUE  PARTICIPATION LIMITATIONS: cleaning and occupation   REHAB POTENTIAL: Good  CLINICAL DECISION MAKING: Stable/uncomplicated  EVALUATION COMPLEXITY: Low   GOALS: Goals reviewed with patient? Yes  SHORT TERM GOALS: Target date: 01/11/2024  Pt will demonstrate 100% compliance to HEP in clinic to demonstrate long term benefit to rehab interventions.  Baseline:  To be provided next sessions  Goal status: INITIAL    LONG TERM GOALS: Target date: 02/22/2024  Pt will improve C/S ROM to AFL without tightness and pain to improve headaches and Median nerve compression.  Baseline: WFL and tight ness at the end range; 02/12/24: Improvement (see note); Goal status: PARTIALLY MET  2.  Pt will demonstrate 0/10 pain with Median nerve tension test Baseline: Positive with tingling in index, middle finger. 02/12/24: Improvement but not full resolution Goal status: PARTIALLY MET  3.  Pt will demonstrate <5/50 score on NDI to improve QOL Baseline: 11/50,  Goal status: INITIAL  4.  Pt will improve Quick Dash score to 11 to demonstrate improve UE functions Baseline: 14; 02/12/24: 11.4% Goal status: PARTIALLY MET  5.  Pt will reduce HDI score by 50 % to demonstrate improved QOL.  Baseline: 28= 37% disability; 02/12/24: 28% Goal status: PARTIALLY MET  6. Pt will improve 2 jaw chuck strength to 3+/5 to demonstrate improve UE functions.              Baseline: 3/5             Goal Status: INITIAL   PLAN:  PT FREQUENCY: 1-2x/week  PT DURATION: 8 weeks  PLANNED INTERVENTIONS: 97110-Therapeutic exercises, 97530- Therapeutic activity, [id number]- Neuromuscular re-education, 97535- Self Care, [id number]- Manual therapy, and [id number]- Electrical stimulation (unattended)  PLAN FOR NEXT SESSION: Median nerve glide  review, C/S manual traction/ mechanical traction, C/S stretches, Neck isometrics  Sherill Ding Davena Julian PT, DPT, GCS  10:12 PM,02/13/24

## 2024-02-12 ENCOUNTER — Ambulatory Visit

## 2024-02-12 DIAGNOSIS — M542 Cervicalgia: Secondary | ICD-10-CM

## 2024-02-13 ENCOUNTER — Other Ambulatory Visit: Payer: Self-pay | Admitting: Family Medicine

## 2024-02-13 DIAGNOSIS — Z1231 Encounter for screening mammogram for malignant neoplasm of breast: Secondary | ICD-10-CM

## 2024-02-14 ENCOUNTER — Encounter

## 2024-02-21 ENCOUNTER — Encounter

## 2024-02-27 NOTE — Therapy (Signed)
 OUTPATIENT PHYSICAL THERAPY CERVICAL TREATMENT/RECERTIFICATION   Patient Name: Mary Macias MRN: [id number] DOB:07/04/1966, 58 y.o., female Today's Date: 02/29/2024  END OF SESSION:  PT End of Session - 02/28/24 1403     Visit Number 11    Number of Visits 33    Date for PT Re-Evaluation 04/24/24    Authorization Type eval: 12/25/23    PT Start Time 1358    PT Stop Time 1445    PT Time Calculation (min) 47 min    Activity Tolerance Patient tolerated treatment well    Behavior During Therapy [facility] for tasks assessed/performed            Past Medical History:  Diagnosis Date   Allergy    Complication of anesthesia    slow to wake up   Diabetes mellitus without complication (HCC)    Pneumonia    PONV (postoperative nausea and vomiting)    Sleep apnea    cpap   Stable angina pectoris (HCC) 08/10/2017   Past Surgical History:  Procedure Laterality Date   CHOLECYSTECTOMY     COLONOSCOPY N/A 11/27/2022   Procedure: COLONOSCOPY;  Surgeon: Quintin Buckle, DO;  Location: [facility] ENDOSCOPY;  Service: Gastroenterology;  Laterality: N/A;   COLONOSCOPY WITH PROPOFOL  N/A 07/13/2017   Procedure: COLONOSCOPY WITH PROPOFOL ;  Surgeon: Deveron Fly, MD;  Location: [facility] ENDOSCOPY;  Service: Endoscopy;  Laterality: N/A;   dental implants Right    NASAL SINUS SURGERY     OOPHORECTOMY     SHOULDER ARTHROSCOPY WITH ROTATOR CUFF REPAIR AND SUBACROMIAL DECOMPRESSION Right 11/22/2020   Procedure: Right shoulder arthroscopic rotator cuff repair, subacromial decompression, and biceps tenodesis ,distal clavicle excision;  Surgeon: Lorri Rota, MD;  Location: [facility] ORS;  Service: Orthopedics;  Laterality: Right;   TOTAL VAGINAL HYSTERECTOMY     TUBAL LIGATION     Patient Active Problem List   Diagnosis Date Noted   Cervical spondylosis 11/23/2023   Acute recurrent maxillary sinusitis 10/15/2021   Adult ADHD 04/28/2021   Hx of total hysterectomy 12/15/2020   Nontraumatic  complete tear of right rotator cuff 12/15/2020   OSA (obstructive sleep apnea) 02/02/2020   Hepatic steatosis 11/22/2019   Depression with anxiety 08/07/2019   Peripheral edema 02/08/2018   BMI 50.0-59.9, adult (HCC) 01/25/2018   Colon polyp, hyperplastic 07/16/2017   Diabetes mellitus treated with injections of non-insulin medication (HCC) 08/18/2015   Hyperlipidemia associated with type 2 diabetes mellitus (HCC) 07/08/2015   Environmental and seasonal allergies 07/08/2015   Avitaminosis D 07/08/2015   Beat, premature ventricular 02/11/2015   PCP: Janna Melter MD  REFERRING PROVIDER: Orpah Blackwater MD  REFERRING DIAG: Cervical Spondylosis  THERAPY DIAG: Headache, Neck tightness and neck pain.   Rationale for Evaluation and Treatment: Rehabilitation  ONSET DATE: 6 to 8 months ago.   FROM INITIAL EVALUATION SUBJECTIVE:  SUBJECTIVE STATEMENT: Pt reported: " My L index finger twitches, tremors and so when I say my Doctor. My CT from 2024 and thinks it is due to arthritis. Since my R shoulder surgery RCR and Bicep tenodesis my neck feels tight. Denies arm pain. Hand dominance: Right  PERTINENT HISTORY:  As per EMR: [name]  reported of Index finger, which occurs mostly at night or when her arm is resting on a surface. The tremor is not constant and is sometimes accompanied by a sensation of vibration.  She denies any overt numbness or tingling in fingers, no weakness. She has a history of degenerative disc disease and has experienced a fall in the past, which resulted in a CT scan of her neck showing degenerative changes. Intermittent unilateral finger tremor in the setting of multilevel cervical spinal arthropathy. Noted to be worse in the evenings. No clear correlation with  medication use/changes however can be correlated to Strattera usage as Tremor is listed as 1% to 5% incidence. Possible cervical spine involvement given unilateral nature and evening predominance. Started trial of low-dose Gabapentin  at bedtime, with option to increase up to three capsules as needed   PAIN: Are you having pain? No, have tension headaches 2 x week from back of the neck in to front.   PRECAUTIONS: None  RED FLAGS: None    WEIGHT BEARING RESTRICTIONS: No  FALLS: Has patient fallen in last 6 months? 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   PLOF: Independent  PATIENT GOALS: " I want to move my neck better and not feel like popping it so that I can play with my grand children and work without pain."   NEXT MD VISIT:  None this time.    OBJECTIVE:  Note: Objective measures were completed at Evaluation unless otherwise noted.  DIAGNOSTIC FINDINGS:  CT Scan: Disc levels: Disc space narrowing with marginal osteophytes C3-4 through C5-6. 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    PATIENT SURVEYS:  NDI 11/50= 22% Quick Dash 14= 6.81 assessment HDI: 28  COGNITION: Overall cognitive status: Within functional limits for tasks assessed  SENSATION: WFL  REFLEX : L brachioradialis : absent  POSTURE: rounded shoulders, forward head, decreased lumbar lordosis, and decreased Cervical Lordosis  PALPATION: No pain elicited   CERVICAL ROM:  AROM AROM (Normal range in degrees) 01/15/24 02/12/24  Cervical   Flexion (50) 55 60  Extension (80) 34 43  Right lateral flexion (45) 25 32  Left lateral flexion (45) 45 35  Right rotation (85) 35 50  Left rotation (85) 60 60  (* = pain; Blank rows = not tested)  UPPER EXTREMITY ROM: WNL  UPPER EXTREMITY MMT: 5/5  EXCEPT L Two Jaw Chuck grip 3/5  CERVICAL SPECIAL TESTS:  Neck  flexor muscle endurance test: Negative, Spurling's test: Negative, Distraction test: Positive ( pt felt relief)  and Sharp pursor's test: Negative  FUNCTIONAL TESTS: None indicated    TREATMENT DATE:    Subjective: Pt arrives reporting ongoing improvement in her headaches. She has not had any headaches since the last therapy session. She has been having ongoing R shoulder pain x 3-4 weeks and would like therapist to assess her shoulder today. She has a history of R supraspinatus and subscapular repair with R biceps tenodesis, SAD, debridement and DCE in 11/2020. After surgery she had PT with good recovery but over the last  few weeks she has had some increase in R shoulder aching/soreness. She denies any focal R shoulder weakness or trauma.  Ther-ex   Shoulder assessment: PAIN:  Pain Intensity: Present: 2/10, Best: 2/10, Worst: 6/10 Pain location: Anterior and posterior R shoulder Pain Quality: Achy Radiating: No  Numbness/Tingling: No Focal Weakness: Unsure Aggravating factors: Laying on the L side, lifting grandson Relieving factors: heat and ice, self-massage, biofreeze, cross-body stretch 24-hour pain behavior: better in the AM, more pain at night; History of prior shoulder or neck/shoulder injury, pain, surgery, or therapy: Yes Dominant hand: right Imaging: No, noting recently   OBJECTIVE:   Cervical Screen AROM: WFL and painless with overpressure in all planes Spurlings A (ipsilateral lateral flexion/axial compression): R: Negative L: Negative Spurlings B (ipsilateral lateral flexion/contralateral rotation/axial compression): R: Negative L: Negative Hoffman Sign (cervical cord compression): R: Not examined L: Not examined ULTT Median: R: Not examined L: Not examined ULTT Ulnar: R: Not examined L: Not examined ULTT Radial: R: Not examined L: Not examined  AROM AROM (Normal range in degrees) AROM   Right Left  Shoulder    Flexion 172 170  Extension    Abduction 170 163   External Rotation 74 90  Internal Rotation 68 70  Hands Behind Head T3 T5  Hands Behind Back T11 T9      Elbow    Flexion WNL WNL  Extension WNL WNL  Pronation    Supination    (* = pain; Blank rows = not tested)  UE MMT: MMT (out of 5) Right Left   Cervical (isometric)  Flexion WNL  Extension WNL  Lateral Flexion WNL WNL  Rotation WNL WNL      Shoulder   Flexion 4+* 5  Extension    Abduction 5 5  External rotation (Seated) 5 5  Internal rotation (Seated) 5 5  Horizontal abduction    Horizontal adduction    Lower Trapezius    Rhomboids        Elbow  Flexion 5 5  Extension 5 5  Pronation 5 5  Supination 5 5      Wrist  Flexion 5 5  Extension 5 5  Radial deviation    Ulnar deviation        MCP  Flexion 5 5  Extension 5 5  Abduction 5 5  Adduction 5 5  (* = pain; Blank rows = not tested)  Sensation Deferred  Reflexes Deferred  Palpation Location LEFT  RIGHT           Subocciptials    Cervical paraspinals    Upper Trapezius    Levator Scapulae    Rhomboid Major/Minor    Sternoclavicular joint    Acromioclavicular joint    Coracoid process    Long head of biceps  1  Supraspinatus  1  Infraspinatus  1  Subscapularis    Teres Minor    Teres Major    Pectoralis Major    Pectoralis Minor    Anterior Deltoid  1  Lateral Deltoid  1  Posterior Deltoid    Latissimus Dorsi    Sternocleidomastoid    (Blank rows = not tested) Graded on 0-4 scale (0 = no pain, 1 = pain, 2 = pain with wincing/grimacing/flinching, 3 = pain with withdrawal, 4 = unwilling to allow palpation), (Blank rows = not tested)  Cervical Passive Accessory Motion Deferred  Accessory Motions/Glides Deferred  Muscle Length Testing Deferred  SPECIAL TESTS Rotator Cuff  Drop Arm Test: Negative Painful  Arc (Pain from 60 to 120 degrees scaption): Negative Infraspinatus Muscle Test: Negative  Subacromial Impingement Hawkins-Kennedy: Positive Neer (Block scapula, PROM  flexion): Positive Painful Arc (Pain from 60 to 120 degrees scaption): Negative Empty Can: Positive External Rotation Resistance: Negative Horizontal Adduction: Not examined Scapular Assist: Positive  Labral Tear Biceps Load II (120 elevation, full ER, 90 elbow flexion, full supination, resisted elbow flexion): Negative Crank (160 scaption, axial load with IR/ER): Negative O'Briens/Active Compression Test (90 shoulder flexion, 10 adduction, full IR): Not examined  Bicep Tendon Pathology Speed (shoulder flexion to 90, external rotation, full elbow extension, and forearm supination with resistance: Positive Yergason's (resisted shoulder ER and supination/biceps tendon pathology): Negative  Shoulder Instability Sulcus Sign: Negative Anterior Apprehension: Negative  Beighton scale Deferred   PATIENT EDUCATION:  Education details: examination findings, updated HEP, plan of care Person educated: Patient Education method: Explanation, Tactile cues, Verbal cues, and handout Education comprehension: verbalized and demonstrated understanding   HOME EXERCISE PROGRAM: Access Code: [email or web address] URL: [email or web address] Date: 02/28/2024 Prepared by: Crawford Dock  Exercises - Seated Scalene Stretch with Towel  - 1-2 x daily - 7 x weekly - 3-5 reps - 45-60s hold - Doorway Pec Stretch at 90 Degrees Abduction  - 1 x daily - 7 x weekly - 3-5 reps - 45-60s hold - Seated Upper Trapezius Stretch  - 1 x daily - 7 x weekly - 3-5 reps - 45-60s hold - Seated Isometric Cervical Sidebending  - 1 x daily - 3 x weekly - 3 sets - 10 reps - 5s hold - Seated Isometric Cervical Rotation  - 1 x daily - 3 x weekly - 3 sets - 10 reps - 5s hold - Shoulder W - External Rotation with Resistance  - 1 x daily - 7 x weekly - 2-3 sets - 10 reps - 3s hold - Scaption with Resistance  - 1 x daily - 7 x weekly - 2-3 sets - 10 reps - 3s hold - Standing Shoulder Flexion with Resistance  - 1 x daily - 7 x  weekly - 2-3 sets - 10 reps - 3s hold - Shoulder External Rotation with Anchored Resistance (Mirrored)  - 1 x daily - 7 x weekly - 2-3 sets - 10 reps - 3s hold   ASSESSMENT:  CLINICAL IMPRESSION: Assessment performed of R shoulder with very mild pain and weakness with resisted R shoulder flexion. Examination consistent with SAIS. She is also lacking ER in R shoulder compared to L and likely secondary to subscapularis repair. Added R shoulder strengthening to HEP. Plan to progress upper quarter strengthening at next visit. Pt advised to follow-up as scheduled. She will benefit from PT services to address deficits in strength, range of motion, and pain in order to return to full function at home.   OBJECTIVE IMPAIRMENTS: decreased mobility, decreased ROM, and decreased strength.   ACTIVITY LIMITATIONS: headaches and gripping with LUE  PARTICIPATION LIMITATIONS: cleaning and occupation   REHAB POTENTIAL: Good  CLINICAL DECISION MAKING: Stable/uncomplicated  EVALUATION COMPLEXITY: Low   GOALS: Goals reviewed with patient? Yes  SHORT TERM GOALS: Target date: 01/11/2024  Pt will demonstrate 100% compliance to HEP in clinic to demonstrate long term benefit to rehab interventions.  Baseline:  To be provided next sessions  Goal status: INITIAL    LONG TERM GOALS: Target date: 02/22/2024  Pt will improve C/S ROM to AFL without tightness and pain to improve headaches and Median nerve compression.  Baseline: WFL and tight ness at the end  range; 02/12/24: Improvement (see note); Goal status: PARTIALLY MET  2.  Pt will demonstrate 0/10 pain with Median nerve tension test Baseline: Positive with tingling in index, middle finger. 02/12/24: Improvement but not full resolution Goal status: PARTIALLY MET  3.  Pt will demonstrate <5/50 score on NDI to improve QOL Baseline: 11/50,  Goal status: INITIAL  4.  Pt will improve Quick Dash score to 11 to demonstrate improve UE functions Baseline: 14%;  02/12/24: 11.4% Goal status: PARTIALLY MET  5.  Pt will reduce HDI score by 50 % to demonstrate improved QOL.  Baseline: 28= 37% disability; 02/12/24: 28% Goal status: PARTIALLY MET  6. Pt will improve 2 jaw chuck strength to 3+/5 to demonstrate improve UE functions.              Baseline: 3/5             Goal Status: INITIAL  7. Pt will increase pain-free R shoulder flexion strength to 5/5 in order to demonstrate improvement in strength and function         Baseline: 4+/5 with pain; Goal status: INITIAL  PLAN:  PT FREQUENCY: 1-2x/week  PT DURATION: 8 weeks  PLANNED INTERVENTIONS: 97110-Therapeutic exercises, 97530- Therapeutic activity, [id number]- Neuromuscular re-education, 97535- Self Care, 54098- Manual therapy, and [id number]- Electrical stimulation (unattended)  PLAN FOR NEXT SESSION: Progressive R RTC and periscapular strengthening, TDN as needed;  Chaz Ronning D Kismet Facemire PT, DPT, GCS  12:42 PM,02/29/24

## 2024-02-28 ENCOUNTER — Ambulatory Visit

## 2024-02-28 DIAGNOSIS — M542 Cervicalgia: Secondary | ICD-10-CM | POA: Diagnosis not present

## 2024-03-03 NOTE — Therapy (Unsigned)
 OUTPATIENT PHYSICAL THERAPY CERVICAL TREATMENT   Patient Name: Mary Macias MRN: [id number] DOB:November 07, 1965, 58 y.o., female Today's Date: 03/06/2024  END OF SESSION:  PT End of Session - 03/05/24 1023     Visit Number 12    Number of Visits 33    Date for PT Re-Evaluation 04/24/24    Authorization Type eval: 12/25/23    PT Start Time 1020    PT Stop Time 1100    PT Time Calculation (min) 40 min    Activity Tolerance Patient tolerated treatment well    Behavior During Therapy [facility] for tasks assessed/performed            Past Medical History:  Diagnosis Date   Allergy    Complication of anesthesia    slow to wake up   Diabetes mellitus without complication (HCC)    Pneumonia    PONV (postoperative nausea and vomiting)    Sleep apnea    cpap   Stable angina pectoris (HCC) 08/10/2017   Past Surgical History:  Procedure Laterality Date   CHOLECYSTECTOMY     COLONOSCOPY N/A 11/27/2022   Procedure: COLONOSCOPY;  Surgeon: Quintin Buckle, DO;  Location: [facility] ENDOSCOPY;  Service: Gastroenterology;  Laterality: N/A;   COLONOSCOPY WITH PROPOFOL  N/A 07/13/2017   Procedure: COLONOSCOPY WITH PROPOFOL ;  Surgeon: Deveron Fly, MD;  Location: [facility] ENDOSCOPY;  Service: Endoscopy;  Laterality: N/A;   dental implants Right    NASAL SINUS SURGERY     OOPHORECTOMY     SHOULDER ARTHROSCOPY WITH ROTATOR CUFF REPAIR AND SUBACROMIAL DECOMPRESSION Right 11/22/2020   Procedure: Right shoulder arthroscopic rotator cuff repair, subacromial decompression, and biceps tenodesis ,distal clavicle excision;  Surgeon: Lorri Rota, MD;  Location: [facility] ORS;  Service: Orthopedics;  Laterality: Right;   TOTAL VAGINAL HYSTERECTOMY     TUBAL LIGATION     Patient Active Problem List   Diagnosis Date Noted   Cervical spondylosis 11/23/2023   Acute recurrent maxillary sinusitis 10/15/2021   Adult ADHD 04/28/2021   Hx of total hysterectomy 12/15/2020   Nontraumatic complete tear of  right rotator cuff 12/15/2020   OSA (obstructive sleep apnea) 02/02/2020   Hepatic steatosis 11/22/2019   Depression with anxiety 08/07/2019   Peripheral edema 02/08/2018   BMI 50.0-59.9, adult (HCC) 01/25/2018   Colon polyp, hyperplastic 07/16/2017   Diabetes mellitus treated with injections of non-insulin medication (HCC) 08/18/2015   Hyperlipidemia associated with type 2 diabetes mellitus (HCC) 07/08/2015   Environmental and seasonal allergies 07/08/2015   Avitaminosis D 07/08/2015   Beat, premature ventricular 02/11/2015   PCP: Janna Melter MD  REFERRING PROVIDER: Orpah Blackwater MD  REFERRING DIAG: Cervical Spondylosis  THERAPY DIAG: Headache, Neck tightness and neck pain.   Rationale for Evaluation and Treatment: Rehabilitation  ONSET DATE: 6 to 8 months ago.   FROM INITIAL EVALUATION SUBJECTIVE:  SUBJECTIVE STATEMENT: Pt reported: " My L index finger twitches, tremors and so when I say my Doctor. My CT from 2024 and thinks it is due to arthritis. Since my R shoulder surgery RCR and Bicep tenodesis my neck feels tight. Denies arm pain. Hand dominance: Right  PERTINENT HISTORY:  As per EMR: Mary Macias  reported of Index finger, which occurs mostly at night or when her arm is resting on a surface. The tremor is not constant and is sometimes accompanied by a sensation of vibration.  She denies any overt numbness or tingling in fingers, no weakness. She has a history of degenerative disc disease and has experienced a fall in the past, which resulted in a CT scan of her neck showing degenerative changes. Intermittent unilateral finger tremor in the setting of multilevel cervical spinal arthropathy. Noted to be worse in the evenings. No clear correlation with medication  use/changes however can be correlated to Strattera usage as Tremor is listed as 1% to 5% incidence. Possible cervical spine involvement given unilateral nature and evening predominance. Started trial of low-dose Gabapentin  at bedtime, with option to increase up to three capsules as needed   PAIN: Are you having pain? No, have tension headaches 2 x week from back of the neck in to front.   PRECAUTIONS: None  RED FLAGS: None    WEIGHT BEARING RESTRICTIONS: No  FALLS: Has patient fallen in last 6 months? 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   PLOF: Independent  PATIENT GOALS: " I want to move my neck better and not feel like popping it so that I can play with my grand children and work without pain."   NEXT MD VISIT:  None this time.    OBJECTIVE:  Note: Objective measures were completed at Evaluation unless otherwise noted.  DIAGNOSTIC FINDINGS:  CT Scan: Disc levels: Disc space narrowing with marginal osteophytes C3-4 through C5-6. 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    PATIENT SURVEYS:  NDI 11/50= 22% Quick Dash 14= 6.81 assessment HDI: 28  COGNITION: Overall cognitive status: Within functional limits for tasks assessed  SENSATION: WFL  REFLEX : L brachioradialis : absent  POSTURE: rounded shoulders, forward head, decreased lumbar lordosis, and decreased Cervical Lordosis  PALPATION: No pain elicited   CERVICAL ROM:  AROM AROM (Normal range in degrees) 01/15/24 02/12/24  Cervical   Flexion (50) 55 60  Extension (80) 34 43  Right lateral flexion (45) 25 32  Left lateral flexion (45) 45 35  Right rotation (85) 35 50  Left rotation (85) 60 60  (* = pain; Blank rows = not tested)  UPPER EXTREMITY ROM: WNL  UPPER EXTREMITY MMT: 5/5  EXCEPT L Two Jaw Chuck grip 3/5  CERVICAL SPECIAL TESTS:  Neck flexor  muscle endurance test: Negative, Spurling's test: Negative, Distraction test: Positive ( pt felt relief)  and Sharp pursor's test: Negative  FUNCTIONAL TESTS: None indicated  Shoulder assessment 02/28/24:  PAIN:  Pain Intensity: Present: 2/10, Best: 2/10, Worst: 6/10 Pain location: Anterior and posterior R shoulder Pain Quality: Achy Radiating: No  Numbness/Tingling: No Focal Weakness: Unsure Aggravating factors: Laying on the L side, lifting grandson Relieving factors: heat and ice, self-massage, biofreeze, cross-body stretch 24-hour pain behavior: better in the AM, more pain at night; History of prior shoulder or neck/shoulder injury, pain, surgery, or therapy: Yes Dominant hand: right Imaging: No, no  recent imaging;  Cervical Screen AROM: WFL and painless with overpressure in all planes Spurlings A (ipsilateral lateral flexion/axial compression): R: Negative L: Negative Spurlings B (ipsilateral lateral flexion/contralateral rotation/axial compression): R: Negative L: Negative Hoffman Sign (cervical cord compression): R: Not examined L: Not examined ULTT Median: R: Not examined L: Not examined ULTT Ulnar: R: Not examined L: Not examined ULTT Radial: R: Not examined L: Not examined  AROM AROM (Normal range in degrees) AROM   Right Left  Shoulder    Flexion 172 170  Extension    Abduction 170 163  External Rotation 74 90  Internal Rotation 68 70  Hands Behind Head T3 T5  Hands Behind Back T11 T9      Elbow    Flexion WNL WNL  Extension WNL WNL  Pronation    Supination    (* = pain; Blank rows = not tested)  UE MMT: MMT (out of 5) Right Left   Cervical (isometric)  Flexion WNL  Extension WNL  Lateral Flexion WNL WNL  Rotation WNL WNL      Shoulder   Flexion 4+* 5  Extension    Abduction 5 5  External rotation (Seated) 5 5  Internal rotation (Seated) 5 5  Horizontal abduction    Horizontal adduction    Lower Trapezius    Rhomboids        Elbow   Flexion 5 5  Extension 5 5  Pronation 5 5  Supination 5 5      Wrist  Flexion 5 5  Extension 5 5  Radial deviation    Ulnar deviation        MCP  Flexion 5 5  Extension 5 5  Abduction 5 5  Adduction 5 5  (* = pain; Blank rows = not tested)  Palpation Location LEFT  RIGHT           Subocciptials    Cervical paraspinals    Upper Trapezius    Levator Scapulae    Rhomboid Major/Minor    Sternoclavicular joint    Acromioclavicular joint    Coracoid process    Long head of biceps  1  Supraspinatus  1  Infraspinatus  1  Subscapularis    Teres Minor    Teres Major    Pectoralis Major    Pectoralis Minor    Anterior Deltoid  1  Lateral Deltoid  1  Posterior Deltoid    Latissimus Dorsi    Sternocleidomastoid    (Blank rows = not tested) Graded on 0-4 scale (0 = no pain, 1 = pain, 2 = pain with wincing/grimacing/flinching, 3 = pain with withdrawal, 4 = unwilling to allow palpation), (Blank rows = not tested)  Cervical Passive Accessory Motion Deferred  Accessory Motions/Glides Deferred  Muscle Length Testing Deferred  SPECIAL TESTS Rotator Cuff  Drop Arm Test: Negative Painful Arc (Pain from 60 to 120 degrees scaption): Negative Infraspinatus Muscle Test: Negative  Subacromial Impingement Hawkins-Kennedy: Positive Neer (Block scapula, PROM flexion): Positive Painful Arc (Pain from 60 to 120 degrees scaption): Negative Empty Can: Positive External Rotation Resistance: Negative Horizontal Adduction: Not examined Scapular Assist: Positive  Labral Tear Biceps Load II (120 elevation, full ER, 90 elbow flexion, full supination, resisted elbow flexion): Negative Crank (160 scaption, axial load with IR/ER): Negative O'Briens/Active Compression Test (90 shoulder flexion, 10 adduction, full IR): Not examined  Bicep Tendon Pathology Speed (shoulder flexion to 90, external rotation, full elbow extension, and forearm supination with resistance: Positive Yergason's  (resisted  shoulder ER and supination/biceps tendon pathology): Negative  Shoulder Instability Sulcus Sign: Negative Anterior Apprehension: Negative  Beighton scale Deferred    TREATMENT DATE:    Subjective: Pt arrives reporting ongoing improvement in her headaches. R shoulder is improving but she continues wit intermittent pain. No specific questions or concerns currently.    Ther-ex  L sidelying R shoulder ER with 3# dumbbell (DB) 2 x 15, x 25; L sidelying R shoulder abduction with 3# DB 2 x 15, x 30; Prone R shoulder extension 2 x 15, x 25; Prone R shoulder horizontal abduction 2 x 15, x 20; Prone R shoulder low trap flexion 2 x 15, x 25;    Manual Therapy  STM with effleurage and intermittent trigger point release to infraspinatus, teres minor, and posterior deltoid;    PATIENT EDUCATION:  Education details: Pt educated throughout session about proper posture and technique with exercises. Improved exercise technique, movement at target joints, use of target muscles after min to mod verbal, visual, tactile cues.  Person educated: Patient Education method: Explanation, Tactile cues, Verbal cues, and handout Education comprehension: verbalized and demonstrated understanding   HOME EXERCISE PROGRAM: Access Code: [email or web address] URL: [email or web address] Date: 02/28/2024 Prepared by: Crawford Dock  Exercises - Seated Scalene Stretch with Towel  - 1-2 x daily - 7 x weekly - 3-5 reps - 45-60s hold - Doorway Pec Stretch at 90 Degrees Abduction  - 1 x daily - 7 x weekly - 3-5 reps - 45-60s hold - Seated Upper Trapezius Stretch  - 1 x daily - 7 x weekly - 3-5 reps - 45-60s hold - Seated Isometric Cervical Sidebending  - 1 x daily - 3 x weekly - 3 sets - 10 reps - 5s hold - Seated Isometric Cervical Rotation  - 1 x daily - 3 x weekly - 3 sets - 10 reps - 5s hold - Shoulder W - External Rotation with Resistance  - 1 x daily - 7 x weekly - 2-3 sets - 10 reps - 3s hold -  Scaption with Resistance  - 1 x daily - 7 x weekly - 2-3 sets - 10 reps - 3s hold - Standing Shoulder Flexion with Resistance  - 1 x daily - 7 x weekly - 2-3 sets - 10 reps - 3s hold - Shoulder External Rotation with Anchored Resistance (Mirrored)  - 1 x daily - 7 x weekly - 2-3 sets - 10 reps - 3s hold   ASSESSMENT:  CLINICAL IMPRESSION: Progress R shoulder strengthening during session today. She denies any increase in pain with all exercises and is able to achieve appropriate fatigue during final set of each exercise. Utilized manual therapy to address trigger points in posterior shoulder musculature. Plan to progress upper quarter strengthening at next visit. Pt advised to follow-up as scheduled. She will benefit from PT services to address deficits in strength, range of motion, and pain in order to return to full function at home.   OBJECTIVE IMPAIRMENTS: decreased mobility, decreased ROM, and decreased strength.   ACTIVITY LIMITATIONS: headaches and gripping with LUE  PARTICIPATION LIMITATIONS: cleaning and occupation   REHAB POTENTIAL: Good  CLINICAL DECISION MAKING: Stable/uncomplicated  EVALUATION COMPLEXITY: Low   GOALS: Goals reviewed with patient? Yes  SHORT TERM GOALS: Target date: 01/11/2024  Pt will demonstrate 100% compliance to HEP in clinic to demonstrate long term benefit to rehab interventions.  Baseline:  To be provided next sessions  Goal status: INITIAL    LONG TERM GOALS: Target date: 02/22/2024  Pt will improve C/S ROM to AFL without tightness and pain to improve headaches and Median nerve compression.  Baseline: WFL and tight ness at the end range; 02/12/24: Improvement (see note); Goal status: PARTIALLY MET  2.  Pt will demonstrate 0/10 pain with Median nerve tension test Baseline: Positive with tingling in index, middle finger. 02/12/24: Improvement but not full resolution Goal status: PARTIALLY MET  3.  Pt will demonstrate <5/50 score on NDI to  improve QOL Baseline: 11/50,  Goal status: INITIAL  4.  Pt will improve Quick Dash score to 11 to demonstrate improve UE functions Baseline: 14%; 02/12/24: 11.4% Goal status: PARTIALLY MET  5.  Pt will reduce HDI score by 50 % to demonstrate improved QOL.  Baseline: 28= 37% disability; 02/12/24: 28% Goal status: PARTIALLY MET  6. Pt will improve 2 jaw chuck strength to 3+/5 to demonstrate improve UE functions.              Baseline: 3/5             Goal Status: INITIAL  7. Pt will increase pain-free R shoulder flexion strength to 5/5 in order to demonstrate improvement in strength and function         Baseline: 4+/5 with pain; Goal status: INITIAL  PLAN:  PT FREQUENCY: 1-2x/week  PT DURATION: 8 weeks  PLANNED INTERVENTIONS: 97110-Therapeutic exercises, 97530- Therapeutic activity, [id number]- Neuromuscular re-education, 97535- Self Care, [id number]- Manual therapy, and [id number]- Electrical stimulation (unattended)  PLAN FOR NEXT SESSION: Progressive R RTC and periscapular strengthening, TDN as needed;  Liberato Stansbery D Katrice Goel PT, DPT, GCS  8:35 AM,03/06/24

## 2024-03-05 ENCOUNTER — Ambulatory Visit

## 2024-03-05 DIAGNOSIS — M542 Cervicalgia: Secondary | ICD-10-CM

## 2024-03-13 ENCOUNTER — Ambulatory Visit: Attending: Family Medicine

## 2024-03-13 DIAGNOSIS — M542 Cervicalgia: Secondary | ICD-10-CM | POA: Insufficient documentation

## 2024-03-13 DIAGNOSIS — M25511 Pain in right shoulder: Secondary | ICD-10-CM | POA: Insufficient documentation

## 2024-03-13 NOTE — Therapy (Signed)
 OUTPATIENT PHYSICAL THERAPY CERVICAL TREATMENT   Patient Name: Mary Macias MRN: [id number] DOB:24-Sep-1966, 58 y.o., female Today's Date: 03/13/2024  END OF SESSION:  PT End of Session - 03/13/24 0917     Visit Number 13    Number of Visits 33    Date for PT Re-Evaluation 04/24/24    Authorization Type eval: 12/25/23    PT Start Time 0846    PT Stop Time 0930    PT Time Calculation (min) 44 min    Activity Tolerance Patient tolerated treatment well    Behavior During Therapy [facility] for tasks assessed/performed            Past Medical History:  Diagnosis Date   Allergy    Complication of anesthesia    slow to wake up   Diabetes mellitus without complication (HCC)    Pneumonia    PONV (postoperative nausea and vomiting)    Sleep apnea    cpap   Stable angina pectoris (HCC) 08/10/2017   Past Surgical History:  Procedure Laterality Date   CHOLECYSTECTOMY     COLONOSCOPY N/A 11/27/2022   Procedure: COLONOSCOPY;  Surgeon: Quintin Buckle, DO;  Location: [facility] ENDOSCOPY;  Service: Gastroenterology;  Laterality: N/A;   COLONOSCOPY WITH PROPOFOL  N/A 07/13/2017   Procedure: COLONOSCOPY WITH PROPOFOL ;  Surgeon: Deveron Fly, MD;  Location: [facility] ENDOSCOPY;  Service: Endoscopy;  Laterality: N/A;   dental implants Right    NASAL SINUS SURGERY     OOPHORECTOMY     SHOULDER ARTHROSCOPY WITH ROTATOR CUFF REPAIR AND SUBACROMIAL DECOMPRESSION Right 11/22/2020   Procedure: Right shoulder arthroscopic rotator cuff repair, subacromial decompression, and biceps tenodesis ,distal clavicle excision;  Surgeon: Lorri Rota, MD;  Location: [facility] ORS;  Service: Orthopedics;  Laterality: Right;   TOTAL VAGINAL HYSTERECTOMY     TUBAL LIGATION     Patient Active Problem List   Diagnosis Date Noted   Cervical spondylosis 11/23/2023   Acute recurrent maxillary sinusitis 10/15/2021   Adult ADHD 04/28/2021   Hx of total hysterectomy 12/15/2020   Nontraumatic complete tear of  right rotator cuff 12/15/2020   OSA (obstructive sleep apnea) 02/02/2020   Hepatic steatosis 11/22/2019   Depression with anxiety 08/07/2019   Peripheral edema 02/08/2018   BMI 50.0-59.9, adult (HCC) 01/25/2018   Colon polyp, hyperplastic 07/16/2017   Diabetes mellitus treated with injections of non-insulin medication (HCC) 08/18/2015   Hyperlipidemia associated with type 2 diabetes mellitus (HCC) 07/08/2015   Environmental and seasonal allergies 07/08/2015   Avitaminosis D 07/08/2015   Beat, premature ventricular 02/11/2015   PCP: Janna Melter MD  REFERRING PROVIDER: Orpah Blackwater MD  REFERRING DIAG: Cervical Spondylosis  THERAPY DIAG: Headache, Neck tightness and neck pain.   Rationale for Evaluation and Treatment: Rehabilitation  ONSET DATE: 6 to 8 months ago.   FROM INITIAL EVALUATION SUBJECTIVE:  SUBJECTIVE STATEMENT: Pt reported: " My L index finger twitches, tremors and so when I say my Doctor. My CT from 2024 and thinks it is due to arthritis. Since my R shoulder surgery RCR and Bicep tenodesis my neck feels tight. Denies arm pain. Hand dominance: Right  PERTINENT HISTORY:  As per EMR: [name]  reported of Index finger, which occurs mostly at night or when her arm is resting on a surface. The tremor is not constant and is sometimes accompanied by a sensation of vibration.  She denies any overt numbness or tingling in fingers, no weakness. She has a history of degenerative disc disease and has experienced a fall in the past, which resulted in a CT scan of her neck showing degenerative changes. Intermittent unilateral finger tremor in the setting of multilevel cervical spinal arthropathy. Noted to be worse in the evenings. No clear correlation with medication  use/changes however can be correlated to Strattera usage as Tremor is listed as 1% to 5% incidence. Possible cervical spine involvement given unilateral nature and evening predominance. Started trial of low-dose Gabapentin  at bedtime, with option to increase up to three capsules as needed   PAIN: Are you having pain? No, have tension headaches 2 x week from back of the neck in to front.   PRECAUTIONS: None  RED FLAGS: None    WEIGHT BEARING RESTRICTIONS: No  FALLS: Has patient fallen in last 6 months? 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   PLOF: Independent  PATIENT GOALS: " I want to move my neck better and not feel like popping it so that I can play with my grand children and work without pain."   NEXT MD VISIT:  None this time.    OBJECTIVE:  Note: Objective measures were completed at Evaluation unless otherwise noted.  DIAGNOSTIC FINDINGS:  CT Scan: Disc levels: Disc space narrowing with marginal osteophytes C3-4 through C5-6. 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    PATIENT SURVEYS:  NDI 11/50= 22% Quick Dash 14= 6.81 assessment HDI: 28  COGNITION: Overall cognitive status: Within functional limits for tasks assessed  SENSATION: WFL  REFLEX : L brachioradialis : absent  POSTURE: rounded shoulders, forward head, decreased lumbar lordosis, and decreased Cervical Lordosis  PALPATION: No pain elicited   CERVICAL ROM:  AROM AROM (Normal range in degrees) 01/15/24 02/12/24  Cervical   Flexion (50) 55 60  Extension (80) 34 43  Right lateral flexion (45) 25 32  Left lateral flexion (45) 45 35  Right rotation (85) 35 50  Left rotation (85) 60 60  (* = pain; Blank rows = not tested)  UPPER EXTREMITY ROM: WNL  UPPER EXTREMITY MMT: 5/5  EXCEPT L Two Jaw Chuck grip 3/5  CERVICAL SPECIAL TESTS:  Neck flexor  muscle endurance test: Negative, Spurling's test: Negative, Distraction test: Positive ( pt felt relief)  and Sharp pursor's test: Negative  FUNCTIONAL TESTS: None indicated  Shoulder assessment 02/28/24:  PAIN:  Pain Intensity: Present: 2/10, Best: 2/10, Worst: 6/10 Pain location: Anterior and posterior R shoulder Pain Quality: Achy Radiating: No  Numbness/Tingling: No Focal Weakness: Unsure Aggravating factors: Laying on the L side, lifting grandson Relieving factors: heat and ice, self-massage, biofreeze, cross-body stretch 24-hour pain behavior: better in the AM, more pain at night; History of prior shoulder or neck/shoulder injury, pain, surgery, or therapy: Yes Dominant hand: right Imaging: No, no  recent imaging;  Cervical Screen AROM: WFL and painless with overpressure in all planes Spurlings A (ipsilateral lateral flexion/axial compression): R: Negative L: Negative Spurlings B (ipsilateral lateral flexion/contralateral rotation/axial compression): R: Negative L: Negative Hoffman Sign (cervical cord compression): R: Not examined L: Not examined ULTT Median: R: Not examined L: Not examined ULTT Ulnar: R: Not examined L: Not examined ULTT Radial: R: Not examined L: Not examined  AROM AROM (Normal range in degrees) AROM   Right Left  Shoulder    Flexion 172 170  Extension    Abduction 170 163  External Rotation 74 90  Internal Rotation 68 70  Hands Behind Head T3 T5  Hands Behind Back T11 T9      Elbow    Flexion WNL WNL  Extension WNL WNL  Pronation    Supination    (* = pain; Blank rows = not tested)  UE MMT: MMT (out of 5) Right Left   Cervical (isometric)  Flexion WNL  Extension WNL  Lateral Flexion WNL WNL  Rotation WNL WNL      Shoulder   Flexion 4+* 5  Extension    Abduction 5 5  External rotation (Seated) 5 5  Internal rotation (Seated) 5 5  Horizontal abduction    Horizontal adduction    Lower Trapezius    Rhomboids        Elbow   Flexion 5 5  Extension 5 5  Pronation 5 5  Supination 5 5      Wrist  Flexion 5 5  Extension 5 5  Radial deviation    Ulnar deviation        MCP  Flexion 5 5  Extension 5 5  Abduction 5 5  Adduction 5 5  (* = pain; Blank rows = not tested)  Palpation Location LEFT  RIGHT           Subocciptials    Cervical paraspinals    Upper Trapezius    Levator Scapulae    Rhomboid Major/Minor    Sternoclavicular joint    Acromioclavicular joint    Coracoid process    Long head of biceps  1  Supraspinatus  1  Infraspinatus  1  Subscapularis    Teres Minor    Teres Major    Pectoralis Major    Pectoralis Minor    Anterior Deltoid  1  Lateral Deltoid  1  Posterior Deltoid    Latissimus Dorsi    Sternocleidomastoid    (Blank rows = not tested) Graded on 0-4 scale (0 = no pain, 1 = pain, 2 = pain with wincing/grimacing/flinching, 3 = pain with withdrawal, 4 = unwilling to allow palpation), (Blank rows = not tested)  Cervical Passive Accessory Motion Deferred  Accessory Motions/Glides Deferred  Muscle Length Testing Deferred  SPECIAL TESTS Rotator Cuff  Drop Arm Test: Negative Painful Arc (Pain from 60 to 120 degrees scaption): Negative Infraspinatus Muscle Test: Negative  Subacromial Impingement Hawkins-Kennedy: Positive Neer (Block scapula, PROM flexion): Positive Painful Arc (Pain from 60 to 120 degrees scaption): Negative Empty Can: Positive External Rotation Resistance: Negative Horizontal Adduction: Not examined Scapular Assist: Positive  Labral Tear Biceps Load II (120 elevation, full ER, 90 elbow flexion, full supination, resisted elbow flexion): Negative Crank (160 scaption, axial load with IR/ER): Negative O'Briens/Active Compression Test (90 shoulder flexion, 10 adduction, full IR): Not examined  Bicep Tendon Pathology Speed (shoulder flexion to 90, external rotation, full elbow extension, and forearm supination with resistance: Positive Yergason's  (resisted  shoulder ER and supination/biceps tendon pathology): Negative  Shoulder Instability Sulcus Sign: Negative Anterior Apprehension: Negative  Beighton scale Deferred    TREATMENT DATE:    Subjective: Pt arrives reporting ongoing improvement in her headaches. She continues with intermittent R shoulder pain. No specific questions or concerns currently.    Manual Therapy  UBE x 5 minutes (2.5 forward/2.5 backwards) at start of session for strengthening and AROM; Upper trap, levator, and lateral flexion stretches x 45s each bilaterally; STM with effleurage and intermittent trigger point release to bilateral upper traps, R levator scapulae, R rhomboids, R infraspinatus, R teres minor, and R posterior deltoid. Prone CPA grade II-III mobilizations, C7-T3, 20s/bout x 1 bout/level; Supine R shoulder distraction; Supine R shoulder AP mobilizations at neutral, grade II-III, 20s/bout x 3 bouts, increase in pain noted;   PATIENT EDUCATION:  Education details: Pt educated throughout session about proper posture and technique with exercises. Improved exercise technique, movement at target joints, use of target muscles after min to mod verbal, visual, tactile cues.  Person educated: Patient Education method: Explanation, Tactile cues, Verbal cues, and handout Education comprehension: verbalized and demonstrated understanding   HOME EXERCISE PROGRAM: Access Code: [email or web address] URL: [email or web address] Date: 02/28/2024 Prepared by: Crawford Dock  Exercises - Seated [place]ene Stretch with Towel  - 1-2 x daily - 7 x weekly - 3-5 reps - 45-60s hold - Doorway Pec Stretch at 90 Degrees Abduction  - 1 x daily - 7 x weekly - 3-5 reps - 45-60s hold - Seated Upper Trapezius Stretch  - 1 x daily - 7 x weekly - 3-5 reps - 45-60s hold - Seated Isometric Cervical Sidebending  - 1 x daily - 3 x weekly - 3 sets - 10 reps - 5s hold - Seated Isometric Cervical Rotation  - 1 x daily - 3 x  weekly - 3 sets - 10 reps - 5s hold - Shoulder W - External Rotation with Resistance  - 1 x daily - 7 x weekly - 2-3 sets - 10 reps - 3s hold - Scaption with Resistance  - 1 x daily - 7 x weekly - 2-3 sets - 10 reps - 3s hold - Standing Shoulder Flexion with Resistance  - 1 x daily - 7 x weekly - 2-3 sets - 10 reps - 3s hold - Shoulder External Rotation with Anchored Resistance (Mirrored)  - 1 x daily - 7 x weekly - 2-3 sets - 10 reps - 3s hold   ASSESSMENT:  CLINICAL IMPRESSION: Additional focus on manual therapy during session today. No severe trigger points noted but ongoing tension in bilateral upper traps, more on the L today. Plan to progress upper quarter strengthening at next visit. Pt advised to follow-up as scheduled. She will benefit from PT services to address deficits in strength, range of motion, and pain in order to return to full function at home.   OBJECTIVE IMPAIRMENTS: decreased mobility, decreased ROM, and decreased strength.   ACTIVITY LIMITATIONS: headaches and gripping with LUE  PARTICIPATION LIMITATIONS: cleaning and occupation   REHAB POTENTIAL: Good  CLINICAL DECISION MAKING: Stable/uncomplicated  EVALUATION COMPLEXITY: Low   GOALS: Goals reviewed with patient? Yes  SHORT TERM GOALS: Target date: 01/11/2024  Pt will demonstrate 100% compliance to HEP in clinic to demonstrate long term benefit to rehab interventions.  Baseline:  To be provided next sessions  Goal status: INITIAL    LONG TERM GOALS: Target date: 02/22/2024  Pt will improve C/S ROM to AFL without tightness and pain to  improve headaches and Median nerve compression.  Baseline: WFL and tight ness at the end range; 02/12/24: Improvement (see note); Goal status: PARTIALLY MET  2.  Pt will demonstrate 0/10 pain with Median nerve tension test Baseline: Positive with tingling in index, middle finger. 02/12/24: Improvement but not full resolution Goal status: PARTIALLY MET  3.  Pt will  demonstrate <5/50 score on NDI to improve QOL Baseline: 11/50,  Goal status: INITIAL  4.  Pt will improve Quick Dash score to 11 to demonstrate improve UE functions Baseline: 14%; 02/12/24: 11.4% Goal status: PARTIALLY MET  5.  Pt will reduce HDI score by 50 % to demonstrate improved QOL.  Baseline: 28= 37% disability; 02/12/24: 28% Goal status: PARTIALLY MET  6. Pt will improve 2 jaw chuck strength to 3+/5 to demonstrate improve UE functions.              Baseline: 3/5             Goal Status: INITIAL  7. Pt will increase pain-free R shoulder flexion strength to 5/5 in order to demonstrate improvement in strength and function         Baseline: 4+/5 with pain; Goal status: INITIAL  PLAN:  PT FREQUENCY: 1-2x/week  PT DURATION: 8 weeks  PLANNED INTERVENTIONS: 97110-Therapeutic exercises, 97530- Therapeutic activity, [id number]- Neuromuscular re-education, 97535- Self Care, [id number]- Manual therapy, and [id number]- Electrical stimulation (unattended)  PLAN FOR NEXT SESSION: Progressive R RTC and periscapular strengthening, TDN as needed;  Keaston Pile D Raymar Joiner PT, DPT, GCS  1:01 PM,03/13/24

## 2024-03-17 ENCOUNTER — Ambulatory Visit

## 2024-03-17 DIAGNOSIS — M542 Cervicalgia: Secondary | ICD-10-CM

## 2024-03-17 NOTE — Therapy (Unsigned)
 OUTPATIENT PHYSICAL THERAPY CERVICAL TREATMENT   Patient Name: Mary Macias MRN: [id number] DOB:10-16-1965, 58 y.o., female Today's Date: 03/17/2024  END OF SESSION:   Past Medical History:  Diagnosis Date   Allergy    Complication of anesthesia    slow to wake up   Diabetes mellitus without complication (HCC)    Pneumonia    PONV (postoperative nausea and vomiting)    Sleep apnea    cpap   Stable angina pectoris (HCC) 08/10/2017   Past Surgical History:  Procedure Laterality Date   CHOLECYSTECTOMY     COLONOSCOPY N/A 11/27/2022   Procedure: COLONOSCOPY;  Surgeon: Quintin Buckle, DO;  Location: [facility] ENDOSCOPY;  Service: Gastroenterology;  Laterality: N/A;   COLONOSCOPY WITH PROPOFOL  N/A 07/13/2017   Procedure: COLONOSCOPY WITH PROPOFOL ;  Surgeon: Deveron Fly, MD;  Location: [facility] ENDOSCOPY;  Service: Endoscopy;  Laterality: N/A;   dental implants Right    NASAL SINUS SURGERY     OOPHORECTOMY     SHOULDER ARTHROSCOPY WITH ROTATOR CUFF REPAIR AND SUBACROMIAL DECOMPRESSION Right 11/22/2020   Procedure: Right shoulder arthroscopic rotator cuff repair, subacromial decompression, and biceps tenodesis ,distal clavicle excision;  Surgeon: Lorri Rota, MD;  Location: [facility] ORS;  Service: Orthopedics;  Laterality: Right;   TOTAL VAGINAL HYSTERECTOMY     TUBAL LIGATION     Patient Active Problem List   Diagnosis Date Noted   Cervical spondylosis 11/23/2023   Acute recurrent maxillary sinusitis 10/15/2021   Adult ADHD 04/28/2021   Hx of total hysterectomy 12/15/2020   Nontraumatic complete tear of right rotator cuff 12/15/2020   OSA (obstructive sleep apnea) 02/02/2020   Hepatic steatosis 11/22/2019   Depression with anxiety 08/07/2019   Peripheral edema 02/08/2018   BMI 50.0-59.9, adult (HCC) 01/25/2018   Colon polyp, hyperplastic 07/16/2017   Diabetes mellitus treated with injections of non-insulin medication (HCC) 08/18/2015   Hyperlipidemia associated  with type 2 diabetes mellitus (HCC) 07/08/2015   Environmental and seasonal allergies 07/08/2015   Avitaminosis D 07/08/2015   Beat, premature ventricular 02/11/2015   PCP: Janna Melter MD  REFERRING PROVIDER: Orpah Blackwater MD  REFERRING DIAG: Cervical Spondylosis  THERAPY DIAG: Headache, Neck tightness and neck pain.   Rationale for Evaluation and Treatment: Rehabilitation  ONSET DATE: 6 to 8 months ago.   FROM INITIAL EVALUATION SUBJECTIVE:  SUBJECTIVE STATEMENT: Pt reported: " My L index finger twitches, tremors and so when I say my Doctor. My CT from 2024 and thinks it is due to arthritis. Since my R shoulder surgery RCR and Bicep tenodesis my neck feels tight. Denies arm pain. Hand dominance: Right  PERTINENT HISTORY:  As per EMR: Mary Macias  reported of Index finger, which occurs mostly at night or when her arm is resting on a surface. The tremor is not constant and is sometimes accompanied by a sensation of vibration.  She denies any overt numbness or tingling in fingers, no weakness. She has a history of degenerative disc disease and has experienced a fall in the past, which resulted in a CT scan of her neck showing degenerative changes. Intermittent unilateral finger tremor in the setting of multilevel cervical spinal arthropathy. Noted to be worse in the evenings. No clear correlation with medication use/changes however can be correlated to Strattera usage as Tremor is listed as 1% to 5% incidence. Possible cervical spine involvement given unilateral nature and evening predominance. Started trial of low-dose Gabapentin  at bedtime, with option to increase up to three capsules as needed   PAIN: Are you having pain? No, have tension headaches 2 x week from back of the neck in  to front.   PRECAUTIONS: None  RED FLAGS: None    WEIGHT BEARING RESTRICTIONS: No  FALLS: Has patient fallen in last 6 months? 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   PLOF: Independent  PATIENT GOALS: " I want to move my neck better and not feel like popping it so that I can play with my grand children and work without pain."   NEXT MD VISIT:  None this time.    OBJECTIVE:  Note: Objective measures were completed at Evaluation unless otherwise noted.  DIAGNOSTIC FINDINGS:  CT Scan: Disc levels: Disc space narrowing with marginal osteophytes C3-4 through C5-6. 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    PATIENT SURVEYS:  NDI 11/50= 22% Quick Dash 14= 6.81 assessment HDI: 28  COGNITION: Overall cognitive status: Within functional limits for tasks assessed  SENSATION: WFL  REFLEX : L brachioradialis : absent  POSTURE: rounded shoulders, forward head, decreased lumbar lordosis, and decreased Cervical Lordosis  PALPATION: No pain elicited   CERVICAL ROM:  AROM AROM (Normal range in degrees) 01/15/24 02/12/24  Cervical   Flexion (50) 55 60  Extension (80) 34 43  Right lateral flexion (45) 25 32  Left lateral flexion (45) 45 35  Right rotation (85) 35 50  Left rotation (85) 60 60  (* = pain; Blank rows = not tested)  UPPER EXTREMITY ROM: WNL  UPPER EXTREMITY MMT: 5/5  EXCEPT L Two Jaw Chuck grip 3/5  CERVICAL SPECIAL TESTS:  Neck flexor muscle endurance test: Negative, Spurling's test: Negative, Distraction test: Positive ( pt felt relief)  and Sharp pursor's test: Negative  FUNCTIONAL TESTS: None indicated  Shoulder assessment 02/28/24:  PAIN:  Pain Intensity: Present: 2/10, Best: 2/10, Worst: 6/10 Pain location: Anterior and posterior R shoulder Pain Quality: Achy Radiating: No  Numbness/Tingling:  No Focal Weakness: Unsure Aggravating factors: Laying on the L side, lifting grandson Relieving factors: heat and ice, self-massage, biofreeze, cross-body stretch 24-hour pain behavior: better in the AM, more pain at night; History of prior shoulder or neck/shoulder injury, pain, surgery, or therapy: Yes Dominant hand: right Imaging: No, no  recent imaging;  Cervical Screen AROM: WFL and painless with overpressure in all planes Spurlings A (ipsilateral lateral flexion/axial compression): R: Negative L: Negative Spurlings B (ipsilateral lateral flexion/contralateral rotation/axial compression): R: Negative L: Negative Hoffman Sign (cervical cord compression): R: Not examined L: Not examined ULTT Median: R: Not examined L: Not examined ULTT Ulnar: R: Not examined L: Not examined ULTT Radial: R: Not examined L: Not examined  AROM AROM (Normal range in degrees) AROM   Right Left  Shoulder    Flexion 172 170  Extension    Abduction 170 163  External Rotation 74 90  Internal Rotation 68 70  Hands Behind Head T3 T5  Hands Behind Back T11 T9      Elbow    Flexion WNL WNL  Extension WNL WNL  Pronation    Supination    (* = pain; Blank rows = not tested)  UE MMT: MMT (out of 5) Right Left   Cervical (isometric)  Flexion WNL  Extension WNL  Lateral Flexion WNL WNL  Rotation WNL WNL      Shoulder   Flexion 4+* 5  Extension    Abduction 5 5  External rotation (Seated) 5 5  Internal rotation (Seated) 5 5  Horizontal abduction    Horizontal adduction    Lower Trapezius    Rhomboids        Elbow  Flexion 5 5  Extension 5 5  Pronation 5 5  Supination 5 5      Wrist  Flexion 5 5  Extension 5 5  Radial deviation    Ulnar deviation        MCP  Flexion 5 5  Extension 5 5  Abduction 5 5  Adduction 5 5  (* = pain; Blank rows = not tested)  Palpation Location LEFT  RIGHT           Subocciptials    Cervical paraspinals    Upper Trapezius    Levator Scapulae     Rhomboid Major/Minor    Sternoclavicular joint    Acromioclavicular joint    Coracoid process    Long head of biceps  1  Supraspinatus  1  Infraspinatus  1  Subscapularis    Teres Minor    Teres Major    Pectoralis Major    Pectoralis Minor    Anterior Deltoid  1  Lateral Deltoid  1  Posterior Deltoid    Latissimus Dorsi    Sternocleidomastoid    (Blank rows = not tested) Graded on 0-4 scale (0 = no pain, 1 = pain, 2 = pain with wincing/grimacing/flinching, 3 = pain with withdrawal, 4 = unwilling to allow palpation), (Blank rows = not tested)  Cervical Passive Accessory Motion Deferred  Accessory Motions/Glides Deferred  Muscle Length Testing Deferred  SPECIAL TESTS Rotator Cuff  Drop Arm Test: Negative Painful Arc (Pain from 60 to 120 degrees scaption): Negative Infraspinatus Muscle Test: Negative  Subacromial Impingement Hawkins-Kennedy: Positive Neer (Block scapula, PROM flexion): Positive Painful Arc (Pain from 60 to 120 degrees scaption): Negative Empty Can: Positive External Rotation Resistance: Negative Horizontal Adduction: Not examined Scapular Assist: Positive  Labral Tear Biceps Load II (120 elevation, full ER, 90 elbow flexion, full supination, resisted elbow flexion): Negative Crank (160 scaption, axial load with IR/ER): Negative O'Briens/Active Compression Test (90 shoulder flexion, 10 adduction, full IR): Not examined  Bicep Tendon Pathology Speed (shoulder flexion to 90, external rotation, full elbow extension, and forearm supination with resistance: Positive Yergason's (resisted shoulder  ER and supination/biceps tendon pathology): Negative  Shoulder Instability Sulcus Sign: Negative Anterior Apprehension: Negative  Beighton scale Deferred    TREATMENT DATE:    Subjective: Pt arrives reporting ongoing improvement in her headaches. She continues with intermittent R shoulder pain. No specific questions or concerns currently.     Manual Therapy  UBE x 5 minutes (2.5 forward/2.5 backwards) at start of session for strengthening and AROM; Upper trap, levator, and lateral flexion stretches x 45s each bilaterally; STM with effleurage and intermittent trigger point release to bilateral upper traps, R levator scapulae, R rhomboids, R infraspinatus, R teres minor, and R posterior deltoid. Prone CPA grade II-III mobilizations, C7-T3, 20s/bout x 1 bout/level; Supine R shoulder distraction; Supine R shoulder AP mobilizations at neutral, grade II-III, 20s/bout x 3 bouts, increase in pain noted;   PATIENT EDUCATION:  Education details: Pt educated throughout session about proper posture and technique with exercises. Improved exercise technique, movement at target joints, use of target muscles after min to mod verbal, visual, tactile cues.  Person educated: Patient Education method: Explanation, Tactile cues, Verbal cues, and handout Education comprehension: verbalized and demonstrated understanding   HOME EXERCISE PROGRAM: Access Code: [email or web address] URL: [email or web address] Date: 02/28/2024 Prepared by: Crawford Dock  Exercises - Seated Scalene Stretch with Towel  - 1-2 x daily - 7 x weekly - 3-5 reps - 45-60s hold - Doorway Pec Stretch at 90 Degrees Abduction  - 1 x daily - 7 x weekly - 3-5 reps - 45-60s hold - Seated Upper Trapezius Stretch  - 1 x daily - 7 x weekly - 3-5 reps - 45-60s hold - Seated Isometric Cervical Sidebending  - 1 x daily - 3 x weekly - 3 sets - 10 reps - 5s hold - Seated Isometric Cervical Rotation  - 1 x daily - 3 x weekly - 3 sets - 10 reps - 5s hold - Shoulder W - External Rotation with Resistance  - 1 x daily - 7 x weekly - 2-3 sets - 10 reps - 3s hold - Scaption with Resistance  - 1 x daily - 7 x weekly - 2-3 sets - 10 reps - 3s hold - Standing Shoulder Flexion with Resistance  - 1 x daily - 7 x weekly - 2-3 sets - 10 reps - 3s hold - Shoulder External Rotation with Anchored  Resistance (Mirrored)  - 1 x daily - 7 x weekly - 2-3 sets - 10 reps - 3s hold   ASSESSMENT:  CLINICAL IMPRESSION: Additional focus on manual therapy during session today. No severe trigger points noted but ongoing tension in bilateral upper traps, more on the L today. Plan to progress upper quarter strengthening at next visit. Pt advised to follow-up as scheduled. She will benefit from PT services to address deficits in strength, range of motion, and pain in order to return to full function at home.   OBJECTIVE IMPAIRMENTS: decreased mobility, decreased ROM, and decreased strength.   ACTIVITY LIMITATIONS: headaches and gripping with LUE  PARTICIPATION LIMITATIONS: cleaning and occupation   REHAB POTENTIAL: Good  CLINICAL DECISION MAKING: Stable/uncomplicated  EVALUATION COMPLEXITY: Low   GOALS: Goals reviewed with patient? Yes  SHORT TERM GOALS: Target date: 01/11/2024  Pt will demonstrate 100% compliance to HEP in clinic to demonstrate long term benefit to rehab interventions.  Baseline:  To be provided next sessions  Goal status: INITIAL    LONG TERM GOALS: Target date: 02/22/2024  Pt will improve C/S ROM to AFL without tightness and pain to  improve headaches and Median nerve compression.  Baseline: WFL and tight ness at the end range; 02/12/24: Improvement (see note); Goal status: PARTIALLY MET  2.  Pt will demonstrate 0/10 pain with Median nerve tension test Baseline: Positive with tingling in index, middle finger. 02/12/24: Improvement but not full resolution Goal status: PARTIALLY MET  3.  Pt will demonstrate <5/50 score on NDI to improve QOL Baseline: 11/50,  Goal status: INITIAL  4.  Pt will improve Quick Dash score to 11 to demonstrate improve UE functions Baseline: 14%; 02/12/24: 11.4% Goal status: PARTIALLY MET  5.  Pt will reduce HDI score by 50 % to demonstrate improved QOL.  Baseline: 28= 37% disability; 02/12/24: 28% Goal status: PARTIALLY MET  6. Pt will  improve 2 jaw chuck strength to 3+/5 to demonstrate improve UE functions.              Baseline: 3/5             Goal Status: INITIAL  7. Pt will increase pain-free R shoulder flexion strength to 5/5 in order to demonstrate improvement in strength and function         Baseline: 4+/5 with pain; Goal status: INITIAL  PLAN:  PT FREQUENCY: 1-2x/week  PT DURATION: 8 weeks  PLANNED INTERVENTIONS: 97110-Therapeutic exercises, 97530- Therapeutic activity, [id number]- Neuromuscular re-education, 97535- Self Care, [id number]- Manual therapy, and [id number]- Electrical stimulation (unattended)  PLAN FOR NEXT SESSION: Progressive R RTC and periscapular strengthening, TDN as needed;  Osualdo Hansell D Gavan Nordby PT, DPT, GCS  11:49 AM,03/17/24

## 2024-03-25 ENCOUNTER — Ambulatory Visit

## 2024-03-25 DIAGNOSIS — M542 Cervicalgia: Secondary | ICD-10-CM | POA: Diagnosis not present

## 2024-03-25 DIAGNOSIS — M25511 Pain in right shoulder: Secondary | ICD-10-CM

## 2024-03-25 NOTE — Therapy (Signed)
 OUTPATIENT PHYSICAL THERAPY CERVICAL TREATMENT   Patient Name: Mary Macias MRN: [id number] DOB:11/02/1965, 58 y.o., female Today's Date: 03/25/2024  END OF SESSION:  PT End of Session - 03/25/24 1444     Visit Number 15    Number of Visits 33    Date for PT Re-Evaluation 04/24/24    Authorization Type eval: 12/25/23    PT Start Time 1445    PT Stop Time 1530    PT Time Calculation (min) 45 min    Activity Tolerance Patient tolerated treatment well    Behavior During Therapy [facility] for tasks assessed/performed         Past Medical History:  Diagnosis Date   Allergy    Complication of anesthesia    slow to wake up   Diabetes mellitus without complication (HCC)    Pneumonia    PONV (postoperative nausea and vomiting)    Sleep apnea    cpap   Stable angina pectoris (HCC) 08/10/2017   Past Surgical History:  Procedure Laterality Date   CHOLECYSTECTOMY     COLONOSCOPY N/A 11/27/2022   Procedure: COLONOSCOPY;  Surgeon: Quintin Buckle, DO;  Location: [facility] ENDOSCOPY;  Service: Gastroenterology;  Laterality: N/A;   COLONOSCOPY WITH PROPOFOL  N/A 07/13/2017   Procedure: COLONOSCOPY WITH PROPOFOL ;  Surgeon: Deveron Fly, MD;  Location: [facility] ENDOSCOPY;  Service: Endoscopy;  Laterality: N/A;   dental implants Right    NASAL SINUS SURGERY     OOPHORECTOMY     SHOULDER ARTHROSCOPY WITH ROTATOR CUFF REPAIR AND SUBACROMIAL DECOMPRESSION Right 11/22/2020   Procedure: Right shoulder arthroscopic rotator cuff repair, subacromial decompression, and biceps tenodesis ,distal clavicle excision;  Surgeon: Lorri Rota, MD;  Location: [facility] ORS;  Service: Orthopedics;  Laterality: Right;   TOTAL VAGINAL HYSTERECTOMY     TUBAL LIGATION     Patient Active Problem List   Diagnosis Date Noted   Cervical spondylosis 11/23/2023   Acute recurrent maxillary sinusitis 10/15/2021   Adult ADHD 04/28/2021   Hx of total hysterectomy 12/15/2020   Nontraumatic complete tear of right  rotator cuff 12/15/2020   OSA (obstructive sleep apnea) 02/02/2020   Hepatic steatosis 11/22/2019   Depression with anxiety 08/07/2019   Peripheral edema 02/08/2018   BMI 50.0-59.9, adult (HCC) 01/25/2018   Colon polyp, hyperplastic 07/16/2017   Diabetes mellitus treated with injections of non-insulin medication (HCC) 08/18/2015   Hyperlipidemia associated with type 2 diabetes mellitus (HCC) 07/08/2015   Environmental and seasonal allergies 07/08/2015   Avitaminosis D 07/08/2015   Beat, premature ventricular 02/11/2015   PCP: Janna Melter MD  REFERRING PROVIDER: Orpah Blackwater MD  REFERRING DIAG: Cervical Spondylosis  THERAPY DIAG: Headache, Neck tightness and neck pain.   Rationale for Evaluation and Treatment: Rehabilitation  ONSET DATE: 6 to 8 months ago.   FROM INITIAL EVALUATION SUBJECTIVE:  SUBJECTIVE STATEMENT: Pt reported:  My L index finger twitches, tremors and so when I say my Doctor. My CT from 2024 and thinks it is due to arthritis. Since my R shoulder surgery RCR and Bicep tenodesis my neck feels tight. Denies arm pain. Hand dominance: Right  PERTINENT HISTORY:  As per EMR: [name]  reported of Index finger, which occurs mostly at night or when her arm is resting on a surface. The tremor is not constant and is sometimes accompanied by a sensation of vibration.  She denies any overt numbness or tingling in fingers, no weakness. She has a history of degenerative disc disease and has experienced a fall in the past, which resulted in a CT scan of her neck showing degenerative changes. Intermittent unilateral finger tremor in the setting of multilevel cervical spinal arthropathy. Noted to be worse in the evenings. No clear correlation with medication use/changes  however can be correlated to Strattera usage as Tremor is listed as 1% to 5% incidence. Possible cervical spine involvement given unilateral nature and evening predominance. Started trial of low-dose Gabapentin  at bedtime, with option to increase up to three capsules as needed   PAIN: Are you having pain? No, have tension headaches 2 x week from back of the neck in to front.   PRECAUTIONS: None  RED FLAGS: None    WEIGHT BEARING RESTRICTIONS: No  FALLS: Has patient fallen in last 6 months? 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   PLOF: Independent  PATIENT GOALS:  I want to move my neck better and not feel like popping it so that I can play with my grand children and work without pain.   NEXT MD VISIT:  None this time.    OBJECTIVE:  Note: Objective measures were completed at Evaluation unless otherwise noted.  DIAGNOSTIC FINDINGS:  CT Scan: Disc levels: Disc space narrowing with marginal osteophytes C3-4 through C5-6. 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    PATIENT SURVEYS:  NDI 11/50= 22% Quick Dash 14= 6.81 assessment HDI: 28  COGNITION: Overall cognitive status: Within functional limits for tasks assessed  SENSATION: WFL  REFLEX : L brachioradialis : absent  POSTURE: rounded shoulders, forward head, decreased lumbar lordosis, and decreased Cervical Lordosis  PALPATION: No pain elicited   CERVICAL ROM:  AROM AROM (Normal range in degrees) 01/15/24 02/12/24  Cervical   Flexion (50) 55 60  Extension (80) 34 43  Right lateral flexion (45) 25 32  Left lateral flexion (45) 45 35  Right rotation (85) 35 50  Left rotation (85) 60 60  (* = pain; Blank rows = not tested)  UPPER EXTREMITY ROM: WNL  UPPER EXTREMITY MMT: 5/5  EXCEPT L Two Jaw Chuck grip 3/5  CERVICAL SPECIAL TESTS:  Neck flexor muscle  endurance test: Negative, Spurling's test: Negative, Distraction test: Positive ( pt felt relief)  and Sharp pursor's test: Negative  FUNCTIONAL TESTS: None indicated  Shoulder assessment 02/28/24:  PAIN:  Pain Intensity: Present: 2/10, Best: 2/10, Worst: 6/10 Pain location: Anterior and posterior R shoulder Pain Quality: Achy Radiating: No  Numbness/Tingling: No Focal Weakness: Unsure Aggravating factors: Laying on the L side, lifting grandson Relieving factors: heat and ice, self-massage, biofreeze, cross-body stretch 24-hour pain behavior: better in the AM, more pain at night; History of prior shoulder or neck/shoulder injury, pain, surgery, or therapy: Yes Dominant hand: right Imaging: No, no  recent imaging;  Cervical Screen AROM: WFL and painless with overpressure in all planes Spurlings A (ipsilateral lateral flexion/axial compression): R: Negative L: Negative Spurlings B (ipsilateral lateral flexion/contralateral rotation/axial compression): R: Negative L: Negative Hoffman Sign (cervical cord compression): R: Not examined L: Not examined ULTT Median: R: Not examined L: Not examined ULTT Ulnar: R: Not examined L: Not examined ULTT Radial: R: Not examined L: Not examined  AROM AROM (Normal range in degrees) AROM   Right Left  Shoulder    Flexion 172 170  Extension    Abduction 170 163  External Rotation 74 90  Internal Rotation 68 70  Hands Behind Head T3 T5  Hands Behind Back T11 T9      Elbow    Flexion WNL WNL  Extension WNL WNL  Pronation    Supination    (* = pain; Blank rows = not tested)  UE MMT: MMT (out of 5) Right Left   Cervical (isometric)  Flexion WNL  Extension WNL  Lateral Flexion WNL WNL  Rotation WNL WNL      Shoulder   Flexion 4+* 5  Extension    Abduction 5 5  External rotation (Seated) 5 5  Internal rotation (Seated) 5 5  Horizontal abduction    Horizontal adduction    Lower Trapezius    Rhomboids        Elbow  Flexion 5 5   Extension 5 5  Pronation 5 5  Supination 5 5      Wrist  Flexion 5 5  Extension 5 5  Radial deviation    Ulnar deviation        MCP  Flexion 5 5  Extension 5 5  Abduction 5 5  Adduction 5 5  (* = pain; Blank rows = not tested)  Palpation Location LEFT  RIGHT           Subocciptials    Cervical paraspinals    Upper Trapezius    Levator Scapulae    Rhomboid Major/Minor    Sternoclavicular joint    Acromioclavicular joint    Coracoid process    Long head of biceps  1  Supraspinatus  1  Infraspinatus  1  Subscapularis    Teres Minor    Teres Major    Pectoralis Major    Pectoralis Minor    Anterior Deltoid  1  Lateral Deltoid  1  Posterior Deltoid    Latissimus Dorsi    Sternocleidomastoid    (Blank rows = not tested) Graded on 0-4 scale (0 = no pain, 1 = pain, 2 = pain with wincing/grimacing/flinching, 3 = pain with withdrawal, 4 = unwilling to allow palpation), (Blank rows = not tested)  Cervical Passive Accessory Motion Deferred  Accessory Motions/Glides Deferred  Muscle Length Testing Deferred  SPECIAL TESTS Rotator Cuff  Drop Arm Test: Negative Painful Arc (Pain from 60 to 120 degrees scaption): Negative Infraspinatus Muscle Test: Negative  Subacromial Impingement Hawkins-Kennedy: Positive Neer (Block scapula, PROM flexion): Positive Painful Arc (Pain from 60 to 120 degrees scaption): Negative Empty Can: Positive External Rotation Resistance: Negative Horizontal Adduction: Not examined Scapular Assist: Positive  Labral Tear Biceps Load II (120 elevation, full ER, 90 elbow flexion, full supination, resisted elbow flexion): Negative Crank (160 scaption, axial load with IR/ER): Negative O'Briens/Active Compression Test (90 shoulder flexion, 10 adduction, full IR): Not examined  Bicep Tendon Pathology Speed (shoulder flexion to 90, external rotation, full elbow extension, and forearm supination with resistance: Positive Yergason's (resisted  shoulder ER and supination/biceps tendon pathology): Negative  Shoulder Instability Sulcus Sign: Negative Anterior Apprehension: Negative  Beighton scale Deferred    TREATMENT DATE:    Subjective: Pt arrives reporting ongoing improvement in her R shoulder pain. No headaches since the last appointment.. No specific questions or concerns currently.    PAIN: Denies   Manual Therapy  Supine upper trap, levator, and lateral flexion stretches x 45s each bilaterally; STM with effleurage and intermittent trigger point release to R upper traps, levator scapulae, and cervical paraspinals.  Supine CPA C2-C4, grade I-II, 20s/bout x 1 bout/lvel; Supine R shoulder AP mobilizations at neutral, grade II-III, 20s/bout x 3 bouts, mild increase in pain noted; Supine R shoulder inferior mobilizations at 90 abduction, grade II-III, 20s/bout x 3 bouts, no increase in pain;   Ther-ex  UBE x 5 minutes (2.5 forward/2.5 backwards) at start of session for strengthening and AROM; Supine manually resisted cervical rotation and lateral flexion x 10 each bilaterally; Supine cervical retractions with overpressure by therapist x 10; Supine R shoulder serratus punches with manual resistance 2 x 10; Supine R shoulder rhythmic stabilization with resistance at wrist and shoulder at 90 flexion 2 x 30s; Standing Nautilus shoulder ER and IR 30# 2 x 10 each  Standing Nautilus R single arm chest press 40# 2 x 10; Standing Nautilus R row 40# 3 x 10; Standing Nautilus RUE D2 flexion 30# 2 x 10;   Not performed: Supine R shoulder flexion from neutral to 90 with 4# DB 2 x 10; Supine R shoulder serratus punch with manual resistance from therapist 2 x 10; Supine R shoulder circles with 4# DB CW/CCW x 10 each direction; Supine chest press with dowel and manual resistance 3 x 10; L sidelying R shoulder abduction with 4# DB 3 x 10; L sidelying R shoulder ER with 4# DB 3 x 10;   PATIENT EDUCATION:  Education details:  Pt educated throughout session about proper posture and technique with exercises. Improved exercise technique, movement at target joints, use of target muscles after min to mod verbal, visual, tactile cues.  Person educated: Patient Education method: Explanation, Tactile cues, Verbal cues, and handout Education comprehension: verbalized and demonstrated understanding   HOME EXERCISE PROGRAM: Access Code: [email or web address] URL: [email or web address] Date: 02/28/2024 Prepared by: Crawford Dock  Exercises - Seated Scalene Stretch with Towel  - 1-2 x daily - 7 x weekly - 3-5 reps - 45-60s hold - Doorway Pec Stretch at 90 Degrees Abduction  - 1 x daily - 7 x weekly - 3-5 reps - 45-60s hold - Seated Upper Trapezius Stretch  - 1 x daily - 7 x weekly - 3-5 reps - 45-60s hold - Seated Isometric Cervical Sidebending  - 1 x daily - 3 x weekly - 3 sets - 10 reps - 5s hold - Seated Isometric Cervical Rotation  - 1 x daily - 3 x weekly - 3 sets - 10 reps - 5s hold - Shoulder W - External Rotation with Resistance  - 1 x daily - 7 x weekly - 2-3 sets - 10 reps - 3s hold - Scaption with Resistance  - 1 x daily - 7 x weekly - 2-3 sets - 10 reps - 3s hold - Standing Shoulder Flexion with Resistance  - 1 x daily - 7 x weekly - 2-3 sets - 10 reps - 3s hold - Shoulder External Rotation with Anchored Resistance (Mirrored)  - 1 x daily - 7 x weekly - 2-3 sets - 10 reps -  3s hold   ASSESSMENT:  CLINICAL IMPRESSION: Progressed R shoulder strengthening during session today. No pain reported during exercises but she still reports mild pain with AP mobilizations. No severe trigger points noted but ongoing tension localized throughout traps and periscapular muscles.  Pt advised to follow-up as scheduled. She will benefit from PT services to address deficits in strength, range of motion, and pain in order to return to full function at home.   OBJECTIVE IMPAIRMENTS: decreased mobility, decreased ROM, and decreased  strength.   ACTIVITY LIMITATIONS: headaches and gripping with LUE  PARTICIPATION LIMITATIONS: cleaning and occupation   REHAB POTENTIAL: Good  CLINICAL DECISION MAKING: Stable/uncomplicated  EVALUATION COMPLEXITY: Low   GOALS: Goals reviewed with patient? Yes  SHORT TERM GOALS: Target date: 01/11/2024  Pt will demonstrate 100% compliance to HEP in clinic to demonstrate long term benefit to rehab interventions.  Baseline:  To be provided next sessions  Goal status: INITIAL    LONG TERM GOALS: Target date: 02/22/2024  Pt will improve C/S ROM to AFL without tightness and pain to improve headaches and Median nerve compression.  Baseline: WFL and tight ness at the end range; 02/12/24: Improvement (see note); Goal status: PARTIALLY MET  2.  Pt will demonstrate 0/10 pain with Median nerve tension test Baseline: Positive with tingling in index, middle finger. 02/12/24: Improvement but not full resolution Goal status: PARTIALLY MET  3.  Pt will demonstrate <5/50 score on NDI to improve QOL Baseline: 11/50,  Goal status: INITIAL  4.  Pt will improve Quick Dash score to 11 to demonstrate improve UE functions Baseline: 14%; 02/12/24: 11.4% Goal status: PARTIALLY MET  5.  Pt will reduce HDI score by 50 % to demonstrate improved QOL.  Baseline: 28= 37% disability; 02/12/24: 28% Goal status: PARTIALLY MET  6. Pt will improve 2 jaw chuck strength to 3+/5 to demonstrate improve UE functions.              Baseline: 3/5             Goal Status: INITIAL  7. Pt will increase pain-free R shoulder flexion strength to 5/5 in order to demonstrate improvement in strength and function         Baseline: 4+/5 with pain; Goal status: INITIAL  PLAN:  PT FREQUENCY: 1-2x/week  PT DURATION: 8 weeks  PLANNED INTERVENTIONS: 97110-Therapeutic exercises, 97530- Therapeutic activity, [id number]- Neuromuscular re-education, 97535- Self Care, [id number]- Manual therapy, and [id number]- Electrical stimulation  (unattended)  PLAN FOR NEXT SESSION: Progressive R RTC and periscapular strengthening, TDN as needed;  Louie Flenner D Lochlan Grygiel PT, DPT, GCS  4:08 PM,03/25/24

## 2024-04-01 ENCOUNTER — Ambulatory Visit

## 2024-04-01 DIAGNOSIS — M25511 Pain in right shoulder: Secondary | ICD-10-CM

## 2024-04-01 DIAGNOSIS — M542 Cervicalgia: Secondary | ICD-10-CM

## 2024-04-01 NOTE — Therapy (Unsigned)
 OUTPATIENT PHYSICAL THERAPY CERVICAL TREATMENT/DISCHARGE   Patient Name: Mary Macias MRN: [id number] DOB:[birth year], 58 y.o., female Today's Date: 04/02/2024  END OF SESSION:  PT End of Session - 04/01/24 1620     Visit Number 16    Number of Visits 33    Date for PT Re-Evaluation 04/24/24    Authorization Type eval: 12/25/23    PT Start Time 1618    PT Stop Time 1700    PT Time Calculation (min) 42 min    Activity Tolerance Patient tolerated treatment well    Behavior During Therapy [facility] for tasks assessed/performed         Past Medical History:  Diagnosis Date   Allergy    Complication of anesthesia    slow to wake up   Diabetes mellitus without complication (HCC)    Pneumonia    PONV (postoperative nausea and vomiting)    Sleep apnea    cpap   Stable angina pectoris (HCC) 08/10/2017   Past Surgical History:  Procedure Laterality Date   CHOLECYSTECTOMY     COLONOSCOPY N/A 11/27/2022   Procedure: COLONOSCOPY;  Surgeon: Onita Elspeth Sharper, DO;  Location: [facility] ENDOSCOPY;  Service: Gastroenterology;  Laterality: N/A;   COLONOSCOPY WITH PROPOFOL  N/A 07/13/2017   Procedure: COLONOSCOPY WITH PROPOFOL ;  Surgeon: Gaylyn Gladis PENNER, MD;  Location: [facility] ENDOSCOPY;  Service: Endoscopy;  Laterality: N/A;   dental implants Right    NASAL SINUS SURGERY     OOPHORECTOMY     SHOULDER ARTHROSCOPY WITH ROTATOR CUFF REPAIR AND SUBACROMIAL DECOMPRESSION Right 11/22/2020   Procedure: Right shoulder arthroscopic rotator cuff repair, subacromial decompression, and biceps tenodesis ,distal clavicle excision;  Surgeon: Tobie Priest, MD;  Location: [facility] ORS;  Service: Orthopedics;  Laterality: Right;   TOTAL VAGINAL HYSTERECTOMY     TUBAL LIGATION     Patient Active Problem List   Diagnosis Date Noted   Cervical spondylosis 11/23/2023   Acute recurrent maxillary sinusitis 10/15/2021   Adult ADHD 04/28/2021   Hx of total hysterectomy 12/15/2020   Nontraumatic complete tear  of right rotator cuff 12/15/2020   OSA (obstructive sleep apnea) 02/02/2020   Hepatic steatosis 11/22/2019   Depression with anxiety 08/07/2019   Peripheral edema 02/08/2018   BMI 50.0-59.9, adult (HCC) 01/25/2018   Colon polyp, hyperplastic 07/16/2017   Diabetes mellitus treated with injections of non-insulin medication (HCC) 08/18/2015   Hyperlipidemia associated with type 2 diabetes mellitus (HCC) 07/08/2015   Environmental and seasonal allergies 07/08/2015   Avitaminosis D 07/08/2015   Beat, premature ventricular 02/11/2015   PCP: Justus Doffing MD  REFERRING PROVIDER: Will Mayo MD  REFERRING DIAG: Cervical Spondylosis  THERAPY DIAG: Headache, Neck tightness and neck pain.   Rationale for Evaluation and Treatment: Rehabilitation  ONSET DATE: 6 to 8 months ago.   FROM INITIAL EVALUATION SUBJECTIVE:  SUBJECTIVE STATEMENT: Pt reported:  My L index finger twitches, tremors and so when I say my Doctor. My CT from 2024 and thinks it is due to arthritis. Since my R shoulder surgery RCR and Bicep tenodesis my neck feels tight. Denies arm pain. Hand dominance: Right  PERTINENT HISTORY:  As per EMR: [name]  reported of Index finger, which occurs mostly at night or when her arm is resting on a surface. The tremor is not constant and is sometimes accompanied by a sensation of vibration.  She denies any overt numbness or tingling in fingers, no weakness. She has a history of degenerative disc disease and has experienced a fall in the past, which resulted in a CT scan of her neck showing degenerative changes. Intermittent unilateral finger tremor in the setting of multilevel cervical spinal arthropathy. Noted to be worse in the evenings. No clear correlation with medication  use/changes however can be correlated to Strattera usage as Tremor is listed as 1% to 5% incidence. Possible cervical spine involvement given unilateral nature and evening predominance. Started trial of low-dose Gabapentin  at bedtime, with option to increase up to three capsules as needed   PAIN: Are you having pain? No, have tension headaches 2 x week from back of the neck in to front.   PRECAUTIONS: None  RED FLAGS: None    WEIGHT BEARING RESTRICTIONS: No  FALLS: Has patient fallen in last 6 months? 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   PLOF: Independent  PATIENT GOALS:  I want to move my neck better and not feel like popping it so that I can play with my grand children and work without pain.   NEXT MD VISIT:  None this time.    OBJECTIVE:  Note: Objective measures were completed at Evaluation unless otherwise noted.  DIAGNOSTIC FINDINGS:  CT Scan: Disc levels: Disc space narrowing with marginal osteophytes C3-4 through C5-6. 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    PATIENT SURVEYS:  NDI 11/50= 22% Quick Dash 14= 6.81 assessment HDI: 28  COGNITION: Overall cognitive status: Within functional limits for tasks assessed  SENSATION: WFL  REFLEX : L brachioradialis : absent  POSTURE: rounded shoulders, forward head, decreased lumbar lordosis, and decreased Cervical Lordosis  PALPATION: No pain elicited   CERVICAL ROM:  AROM AROM (Normal range in degrees) 01/15/24 02/12/24  Cervical   Flexion (50) 55 60  Extension (80) 34 43  Right lateral flexion (45) 25 32  Left lateral flexion (45) 45 35  Right rotation (85) 35 50  Left rotation (85) 60 60  (* = pain; Blank rows = not tested)  UPPER EXTREMITY ROM: WNL  UPPER EXTREMITY MMT: 5/5  EXCEPT L Two Jaw Chuck grip 3/5  CERVICAL SPECIAL TESTS:  Neck flexor  muscle endurance test: Negative, Spurling's test: Negative, Distraction test: Positive ( pt felt relief)  and Sharp pursor's test: Negative  FUNCTIONAL TESTS: None indicated  Shoulder assessment 02/28/24:  PAIN:  Pain Intensity: Present: 2/10, Best: 2/10, Worst: 6/10 Pain location: Anterior and posterior R shoulder Pain Quality: Achy Radiating: No  Numbness/Tingling: No Focal Weakness: Unsure Aggravating factors: Laying on the L side, lifting grandson Relieving factors: heat and ice, self-massage, biofreeze, cross-body stretch 24-hour pain behavior: better in the AM, more pain at night; History of prior shoulder or neck/shoulder injury, pain, surgery, or therapy: Yes Dominant hand: right Imaging: No, no  recent imaging;  Cervical Screen AROM: WFL and painless with overpressure in all planes Spurlings A (ipsilateral lateral flexion/axial compression): R: Negative L: Negative Spurlings B (ipsilateral lateral flexion/contralateral rotation/axial compression): R: Negative L: Negative Hoffman Sign (cervical cord compression): R: Not examined L: Not examined ULTT Median: R: Not examined L: Not examined ULTT Ulnar: R: Not examined L: Not examined ULTT Radial: R: Not examined L: Not examined  AROM AROM (Normal range in degrees) AROM   Right Left  Shoulder    Flexion 172 170  Extension    Abduction 170 163  External Rotation 74 90  Internal Rotation 68 70  Hands Behind Head T3 T5  Hands Behind Back T11 T9      Elbow    Flexion WNL WNL  Extension WNL WNL  Pronation    Supination    (* = pain; Blank rows = not tested)  UE MMT: MMT (out of 5) Right Left   Cervical (isometric)  Flexion WNL  Extension WNL  Lateral Flexion WNL WNL  Rotation WNL WNL      Shoulder   Flexion 4+* 5  Extension    Abduction 5 5  External rotation (Seated) 5 5  Internal rotation (Seated) 5 5  Horizontal abduction    Horizontal adduction    Lower Trapezius    Rhomboids        Elbow   Flexion 5 5  Extension 5 5  Pronation 5 5  Supination 5 5      Wrist  Flexion 5 5  Extension 5 5  Radial deviation    Ulnar deviation        MCP  Flexion 5 5  Extension 5 5  Abduction 5 5  Adduction 5 5  (* = pain; Blank rows = not tested)  Palpation Location LEFT  RIGHT           Subocciptials    Cervical paraspinals    Upper Trapezius    Levator Scapulae    Rhomboid Major/Minor    Sternoclavicular joint    Acromioclavicular joint    Coracoid process    Long head of biceps  1  Supraspinatus  1  Infraspinatus  1  Subscapularis    Teres Minor    Teres Major    Pectoralis Major    Pectoralis Minor    Anterior Deltoid  1  Lateral Deltoid  1  Posterior Deltoid    Latissimus Dorsi    Sternocleidomastoid    (Blank rows = not tested) Graded on 0-4 scale (0 = no pain, 1 = pain, 2 = pain with wincing/grimacing/flinching, 3 = pain with withdrawal, 4 = unwilling to allow palpation), (Blank rows = not tested)  Cervical Passive Accessory Motion Deferred  Accessory Motions/Glides Deferred  Muscle Length Testing Deferred  SPECIAL TESTS Rotator Cuff  Drop Arm Test: Negative Painful Arc (Pain from 60 to 120 degrees scaption): Negative Infraspinatus Muscle Test: Negative  Subacromial Impingement Hawkins-Kennedy: Positive Neer (Block scapula, PROM flexion): Positive Painful Arc (Pain from 60 to 120 degrees scaption): Negative Empty Can: Positive External Rotation Resistance: Negative Horizontal Adduction: Not examined Scapular Assist: Positive  Labral Tear Biceps Load II (120 elevation, full ER, 90 elbow flexion, full supination, resisted elbow flexion): Negative Crank (160 scaption, axial load with IR/ER): Negative O'Briens/Active Compression Test (90 shoulder flexion, 10 adduction, full IR): Not examined  Bicep Tendon Pathology Speed (shoulder flexion to 90, external rotation, full elbow extension, and forearm supination with resistance: Positive Yergason's  (resisted  shoulder ER and supination/biceps tendon pathology): Negative  Shoulder Instability Sulcus Sign: Negative Anterior Apprehension: Negative  Beighton scale Deferred    TREATMENT DATE:    Subjective: Pt arrives reporting ongoing improvement in her R shoulder pain. No headaches since the last appointment and neck stiffness has improved significantly.. She is ready for discharge today. No specific questions or concerns currently.    PAIN: Denies   Manual Therapy  UBE x 5 minutes (2.5 forward/2.5 backwards) at start of session for strengthening and AROM; Updated outcome measures with patient (see goal section);  AROM AROM (Normal range in degrees) 01/15/24 02/12/24 04/01/24  Cervical    Flexion (50) 55 60   Extension (80) 34 43   Right lateral flexion (45) 25 32   Left lateral flexion (45) 45 35   Right rotation (85) 35 50 64  Left rotation (85) 60 60 65  (* = pain; Blank rows = not tested)  Supine upper trap and levator stretches x 45s each bilaterally; Reviewed HEP and updated;   PATIENT EDUCATION:  Education details: HEP and discharge Person educated: Patient Education method: Explanation and handout Education comprehension: verbalized and demonstrated understanding   HOME EXERCISE PROGRAM: Access Code: [email or web address] URL: [email or web address] Date: 02/28/2024 Prepared by: Selinda Eck  Exercises - Seated Scalene Stretch with Towel  - 1-2 x daily - 7 x weekly - 3-5 reps - 45-60s hold - Doorway Pec Stretch at 90 Degrees Abduction  - 1 x daily - 7 x weekly - 3-5 reps - 45-60s hold - Seated Upper Trapezius Stretch  - 1 x daily - 7 x weekly - 3-5 reps - 45-60s hold - Seated Isometric Cervical Sidebending  - 1 x daily - 3 x weekly - 3 sets - 10 reps - 5s hold - Seated Isometric Cervical Rotation  - 1 x daily - 3 x weekly - 3 sets - 10 reps - 5s hold - Shoulder W - External Rotation with Resistance  - 1 x daily - 7 x weekly - 2-3 sets - 10 reps - 3s  hold - Scaption with Resistance  - 1 x daily - 7 x weekly - 2-3 sets - 10 reps - 3s hold - Standing Shoulder Flexion with Resistance  - 1 x daily - 7 x weekly - 2-3 sets - 10 reps - 3s hold - Shoulder External Rotation with Anchored Resistance (Mirrored)  - 1 x daily - 7 x weekly - 2-3 sets - 10 reps - 3s hold   ASSESSMENT:  CLINICAL IMPRESSION: Updated outcome measures with patient. She has not had any headaches in an extended period of time. Her NDI improved from 22% initially to 4% today and her QuickDASH improved from 14% to 4.5%. Headache Disability Index decreased from 37% initially to 12% today. Her R shoulder flexion strength remains 4+/5 but is no longer painful. Discussed updated HEP and pt encouraged to continue. She is ready for discharge today. Pt encouraged to follow-up if headaches recur or shoulder stiffness/pain worsens.   OBJECTIVE IMPAIRMENTS: decreased mobility, decreased ROM, and decreased strength.   ACTIVITY LIMITATIONS: headaches and gripping with LUE  PARTICIPATION LIMITATIONS: cleaning and occupation   REHAB POTENTIAL: Good  CLINICAL DECISION MAKING: Stable/uncomplicated  EVALUATION COMPLEXITY: Low   GOALS: Goals reviewed with patient? Yes  SHORT TERM GOALS: Target date: 01/11/2024  Pt will demonstrate 100% compliance to HEP in clinic to demonstrate long term benefit to rehab interventions.  Baseline:  To be provided next sessions  Goal status: INITIAL  LONG TERM GOALS: Target date: 02/22/2024  Pt will improve C/S ROM to AFL without tightness and pain to improve headaches and Median nerve compression.  Baseline: WFL and tight ness at the end range; 02/12/24: Improvement (see note); 04/01/24: Improvement (SEE NOTED); Goal status: ACHIEVED  2.  Pt will demonstrate 0/10 pain with Median nerve tension test Baseline: Positive with tingling in index, middle finger. 02/12/24: Improvement but not full resolution; 04/01/24: No twitching or pain; Goal status:  ACHIEVED  3.  Pt will demonstrate <5/50 score on NDI to improve QOL Baseline: 22%, 04/01/24: 4%  Goal status: ACHIEVED  4.  Pt will improve QuickDASH score to 11 to demonstrate improve UE functions Baseline: 14%; 02/12/24: 11.4%; 04/01/24: 4.5% Goal status: ACHIEVED  5.  Pt will reduce HDI score by 50 % to demonstrate improved QOL.  Baseline: 28= 37% disability; 02/12/24: 28%; 04/01/24: 12%  Goal status: ACHIEVED  6. Pt will improve 2 jaw chuck strength to 3+/5 to demonstrate improve UE functions.              Baseline: 3/5             Goal Status: DEFERRED  7. Pt will increase pain-free R shoulder flexion strength to 5/5 in order to demonstrate improvement in strength and function         Baseline: 4+/5 with pain; 04/01/24: 4+/5 no pain Goal status: PARTIALLY MET  PLAN:  PT FREQUENCY: 1-2x/week  PT DURATION: 8 weeks  PLANNED INTERVENTIONS: 97110-Therapeutic exercises, 97530- Therapeutic activity, [id number]- Neuromuscular re-education, 97535- Self Care, [id number]- Manual therapy, and [id number]- Electrical stimulation (unattended)  PLAN FOR NEXT SESSION: Discharge  Selinda BIRCH Juanelle Trueheart PT, DPT, GCS  1:51 PM,04/02/24

## 2024-06-25 ENCOUNTER — Other Ambulatory Visit: Payer: Self-pay | Admitting: Internal Medicine

## 2024-06-25 DIAGNOSIS — E118 Type 2 diabetes mellitus with unspecified complications: Secondary | ICD-10-CM

## 2024-09-03 ENCOUNTER — Ambulatory Visit
Admission: RE | Admit: 2024-09-03 | Discharge: 2024-09-03 | Disposition: A | Discharge status: Home / Self Care | Source: Ambulatory Visit | Attending: Family Medicine | Admitting: Family Medicine

## 2024-09-03 DIAGNOSIS — Z1231 Encounter for screening mammogram for malignant neoplasm of breast: Secondary | ICD-10-CM | POA: Diagnosis present
